# Patient Record
Sex: Male | Born: 1950
Health system: Southern US, Community
[De-identification: ages and names within clinical notes are randomized; demographics above are authoritative.]

## PROBLEM LIST (undated history)

## (undated) ENCOUNTER — Ambulatory Visit: Admission: EM | Payer: Medicare (Managed Care) | Source: Home / Self Care

## (undated) DIAGNOSIS — R52 Pain, unspecified: Secondary | ICD-10-CM

## (undated) DIAGNOSIS — T8859XA Other complications of anesthesia, initial encounter: Secondary | ICD-10-CM

## (undated) DIAGNOSIS — T4145XA Adverse effect of unspecified anesthetic, initial encounter: Secondary | ICD-10-CM

## (undated) DIAGNOSIS — D649 Anemia, unspecified: Secondary | ICD-10-CM

## (undated) DIAGNOSIS — K219 Gastro-esophageal reflux disease without esophagitis: Secondary | ICD-10-CM

## (undated) DIAGNOSIS — E785 Hyperlipidemia, unspecified: Secondary | ICD-10-CM

## (undated) DIAGNOSIS — I10 Essential (primary) hypertension: Secondary | ICD-10-CM

## (undated) DIAGNOSIS — G43909 Migraine, unspecified, not intractable, without status migrainosus: Secondary | ICD-10-CM

## (undated) HISTORY — PX: CHOLECYSTECTOMY: SHX55

## (undated) HISTORY — DX: Migraine, unspecified, not intractable, without status migrainosus: G43.909

## (undated) HISTORY — PX: TONSILLECTOMY: SUR1361

## (undated) HISTORY — PX: OTHER SURGICAL HISTORY: SHX169

---

## 2004-07-16 ENCOUNTER — Encounter: Admission: RE | Admit: 2004-07-16 | Discharge: 2004-07-16 | Payer: Self-pay | Admitting: Orthopaedic Surgery

## 2004-08-17 HISTORY — PX: BACK SURGERY: SHX140

## 2005-01-01 ENCOUNTER — Inpatient Hospital Stay (HOSPITAL_COMMUNITY): Admission: RE | Admit: 2005-01-01 | Discharge: 2005-01-05 | Payer: Self-pay | Admitting: Orthopaedic Surgery

## 2005-04-22 ENCOUNTER — Emergency Department (HOSPITAL_COMMUNITY): Admission: EM | Admit: 2005-04-22 | Discharge: 2005-04-22 | Payer: Self-pay | Admitting: Emergency Medicine

## 2006-04-28 ENCOUNTER — Ambulatory Visit (HOSPITAL_BASED_OUTPATIENT_CLINIC_OR_DEPARTMENT_OTHER): Admission: RE | Admit: 2006-04-28 | Discharge: 2006-04-28 | Payer: Self-pay | Admitting: Orthopedic Surgery

## 2009-05-21 ENCOUNTER — Emergency Department (HOSPITAL_COMMUNITY): Admission: EM | Admit: 2009-05-21 | Discharge: 2009-05-21 | Payer: Self-pay | Admitting: Emergency Medicine

## 2009-06-03 ENCOUNTER — Encounter: Admission: RE | Admit: 2009-06-03 | Discharge: 2009-06-03 | Payer: Self-pay | Admitting: Family Medicine

## 2010-05-13 ENCOUNTER — Telehealth: Payer: Self-pay | Admitting: Internal Medicine

## 2010-11-20 LAB — URINALYSIS, ROUTINE W REFLEX MICROSCOPIC
Bilirubin Urine: NEGATIVE
Glucose, UA: NEGATIVE mg/dL
Hgb urine dipstick: NEGATIVE
Ketones, ur: NEGATIVE mg/dL
Nitrite: NEGATIVE
Protein, ur: NEGATIVE mg/dL
Specific Gravity, Urine: 1.029 (ref 1.005–1.030)
Urobilinogen, UA: 1 mg/dL (ref 0.0–1.0)
pH: 6 (ref 5.0–8.0)

## 2011-01-02 NOTE — Op Note (Signed)
NAMESAYEED, WEATHERALL             ACCOUNT NO.:  [id number]   MEDICAL RECORD NO.:  [id number]          PATIENT TYPE:  AMB   LOCATION:  DSC                          FACILITY:  [facility]   PHYSICIAN:  Leonides Grills, M.D.     DATE OF BIRTH:  May 17, 1951   DATE OF PROCEDURE:  04/28/2006  DATE OF DISCHARGE:                                 OPERATIVE REPORT   PREOPERATIVE DIAGNOSIS:  Complication of hardware, left foot.   POSTOPERATIVE DIAGNOSIS:  Complication of hardware, left foot.   OPERATION:  1. Hardware removal, deep left foot.  2. Stress x-rays, left foot.   SURGEON:  Leonides Grills, M.D.   ASSISTANT:  Evlyn Kanner, P.A.   ANESTHESIA:  General.   ESTIMATED BLOOD LOSS:  Minimal.   TOURNIQUET TIME:  Approximately 15 minutes.   COMPLICATIONS:  None.   DISPOSITION:  Stable to the PAR.   INDICATIONS:  This is a 60 year old male, who has had persistent plantar  foot pain after a Chevron osteotomy.  He is 1 year postop.  He has consented  to the above procedure.  All risks, which include infection, nerve or vessel  injury, persistent pain, worsening pain, stiffness and arthritis, were all  explained.  Questions were encouraged and answered.   DESCRIPTION OF OPERATION:  The patient was brought to the operating room and  placed in supine position.  After adequate general endotracheal tube  anesthesia was administered, as well as Ancef 1 g IV piggyback, the left  lower extremity was then prepped and draped in the usual sterile manner over  a proximally placed thigh cavity.  The limb was gravity exsanguinated and  the tourniquet was elevated to 290 mmHg.  Under C-arm guidance, the head of  the screw was then identified on the dorsomedial aspect of his left great  toe.  This was then marked, and a nick-and-spread technique was utilized to  go down to bone.  Then, with 2 Ragnell retractors, the head was then  identified and the screw was removed with a minifragment screwdriver.  This  was intact with no evidence of hardware failure.  Stress x-rays were obtained and showed no gross motion and healing of the  osteotomy.  The tourniquet was deflated.  Hemostasis was obtained.  The  wound was copiously irrigated with normal saline.  The skin was closed with  4-0 nylon suture.  A sterile dressing was applied.  A hard-sole shoe was  applied.  The patient was stable to the PAR.      Leonides Grills, M.D.  Electronically Signed     PB/MEDQ  D:  04/28/2006  T:  04/28/2006  Job:  [id number]

## 2011-01-02 NOTE — Op Note (Signed)
NAMESMITTY, ACKERLEY             ACCOUNT NO.:  [id number]   MEDICAL RECORD NO.:  [id number]          PATIENT TYPE:  INP   LOCATION:  2550                         FACILITY:  [facility]   PHYSICIAN:  Sharolyn Douglas, M.D.        DATE OF BIRTH:  1951/06/07   DATE OF PROCEDURE:  01/01/2005  DATE OF DISCHARGE:                                 OPERATIVE REPORT   DIAGNOSES:  1.  S1-S2 isthmic spondylolisthesis.  2.  L4-L5 degenerative disk disease and spinal stenosis.  3.  Chronic back and left leg pain.   PROCEDURE:  1.  L4-L5 and L5-S1 laminectomy with decompression of the thecal sac and      nerve roots bilaterally.  2.  Transforaminal lumbar interbody fusion L4-L5 and L5-S1 with placement of      two peak cages.  3.  Posterior spinal arthrodesis, L4-S1  4.  Segmental pedicle screw instrumentation L4-S1 using spine system.  5.  Local autogenous bone graft supplemented with bone morphogenic protein.   SURGEON:  Sharolyn Douglas, M.D.   ASSISTANT:  Verlin Fester, P.A.   ANESTHESIA:  General endotracheal.   COMPLICATIONS:  None.   ESTIMATED BLOOD LOSS:  300 cc.   INDICATIONS:  The patient is a pleasant 60 year old male with chronic,  persistent back and left leg pain.  He has been refractory to all  conservative treatment modalities.  He has had a second opinion.  He has  elected to undergo L4-S1 decompression and fusion for treatment of isthmic  spondylolisthesis at L5-S1 and concordant pain on diskography at L4-L5.  For  numbering purposes, we will consider the last two motion segments as L5-S1  and S1-S2 which were the levels operated on today.   DESCRIPTION OF PROCEDURE:  The patient was identified in the holding area,  taken to the operating room, underwent general endotracheal anesthesia  without difficulty, and given prophylactic IV antibiotics.  Carefully turned  prone onto the Wilson frame with all bony prominences padded and the back  prepped and draped in the usual sterile fashion.   Neural monitoring was  established in the form of SSEP in the upper extremities and lower extremity  EMGs.  Midline incision was utilized.  Dissection was carried sharply  through the deep fascia.  The transverse processes of L5, S1 and the sacral  ala of S2 identified bilaterally.  Deep retractors were placed.  Intraoperative x-ray confirmed appropriate levels.  The S1 spinous process  and lamina was loose consistent with the patient's diagnosis of isthmic  spondylolisthesis.  The patient was hyperlordotic and it appeared that the  L5-S1 joints were impinging upon the S1-S2 joints.  A wide laminectomy was  completed by removing the entire spinous process and lamina of S1.  Half of  the L5 spinous process was removed, preserving the interspinous ligament  between L4 and L5.  The thecal sac was decompressed.  The nerve roots were  identified bilaterally and the lateral recess was undercut.  We then  completed foraminotomies, decompressing the L5 nerve roots out their  foramen.   We then turned our attention to  placing pedicle screws at L5, S1 and S2  bilaterally.  We utilized 6.5 x 15 mm screws at L5, 6.5 x 35 mm screws in  S1, and 7.5 x 30 mm screws in S2.  We had good screw purchase.  Each pedicle  hole was palpated and there were no breaches.  We could also palpate the  pedicles from within the spinal canal.  Each screw was stimulated using  trigger EMGs and there were no  deleterious changes.   We then turned our attention to completing transforaminal lumbar interbody  fusions on the left side at L5-S1 and S1-S2.  The disk spaces were entered,  the exiting and traversing nerve roots were identified and protected at all  times.  Free running EMGs were monitored. There were no deleterious changes.  Radical diskectomy completed with curved TLIF instruments.  The  cartilaginous endplates were scraped.  The disk spaces were packed with BMP  sponges as well as local bone graft obtained from  the laminectomy.  We then  inserted a 9-mm peak cage packed with a BMP sponge into the S1-S2 interspace  and carefully tamped it across the midline and anteriorly.  A similar  procedure was carried out at L5-S1, this time utilizing an 11-mm peak cage.  We completed the posterior spinal arthrodesis by decorticating the  transverse processes of L5, S1 and the S2 ala.  The remaining local bone  graft was packed into the lateral gutters.  60-mm titanium rods were bent  into lordosis and placed into the polyaxial screw heads.  Gentle compression  was applied across the fused segments before shearing off the locking caps.  A transverse connector was placed.  Deep Hemovac drain left, deep fascia  closed with a running #1 Vicryl, subcutaneous layer closed with interrupted  2-0 Vicryl, followed by a running 3-0 subcuticular Vicryl suture on the skin  edges.  Benzoin and Steri-Strips placed.  Sterile dressing applied. The  patient was turned supine, extubated without difficulty and transferred to  recovery.  It should be noted that intraoperative x-rays showed appropriate  positioning of the instrumentation, L5-S2.  The patient was evaluated in the  holding area and he was moving his upper and lower extremities.      MC/MEDQ  D:  01/01/2005  T:  01/01/2005  Job:  [id number]

## 2011-01-02 NOTE — H&P (Signed)
NAME:  [name], [name]             ACCOUNT NO.:  [id number]   MEDICAL RECORD NO.:  [id number]          PATIENT TYPE:  INP   LOCATION:  NA                           FACILITY:  [facility]   PHYSICIAN:  Sharolyn Douglas, M.D.        DATE OF BIRTH:  1951-05-03   DATE OF ADMISSION:  01/01/2005  DATE OF DISCHARGE:                                HISTORY & PHYSICAL   CHIEF COMPLAINT:  Pain in my low back.   HISTORY OF PRESENT ILLNESS:  A 60 year old white male who has been seen by  [place] with progressive problems concerning persistent low back pain with  radiation into the lower extremity. The pain has been worsening over the  last several months. Discogram performed showed painful disc at L5-S1 and S1-  S2. He has had epidural steroid injections in these areas at the end of last  but unfortunately has not ameliorating his overall discomfort. He continues  to state his pain as 9/10 on analog scale and is quite miserable with his  discomfort. He has explored and studied the surgical procedures, had a  second opinion by Dr. Kathaleen Maser. Pool. Due to the chronicity of his discomfort  and the fact that conservative care has not helped him, he has decided to go  ahead with surgical intervention at this time and is being admitted for a L4  to S1 posterior spinal fusion.   The patient has been cleared preoperatively by Dr. Tally Joe of [facility]. Today, the patient is fitted with a Centric lumbar  orthosis which will be used postoperatively for his care.   PAST MEDICAL HISTORY:  This gentleman has been in relative good health  throughout his life time.   ALLERGIES:  No medical allergies.   MEDICATIONS:  Diclofenac 50 mg b.i.d. which he plans to stop prior to  surgery.   PAST SURGICAL HISTORY:  His only surgery has been to his left foot in the  past.   FAMILY HISTORY:  Positive for diabetes in his mother.   SOCIAL HISTORY:  The patient is married. He works for [employer]. He has  no  intake of tobacco or alcohol products. He has three children. Wife and  daughter will be major care givers after surgery. He lives in a two-story  home with 12 stairs.   REVIEW OF SYMPTOMS:  CNS:  No seizure disorder, paralysis, double vision.  The patient does have radicular pain from the lumbar spine into the lower  extremities. CARDIOVASCULAR:  No chest pain, no angina, and no orthopnea.  RESPIRATORY:  No productive cough, no hemoptysis, or shortness of breath.  GASTROINTESTINAL:  No nausea, vomiting, melena, or bloody stools.  GENITOURINARY:  No discharge, dysuria, or hematuria. MUSCULOSKELETAL:  Primarily in present illness.   PHYSICAL EXAMINATION:  GENERAL:  Alert, cooperative, thin, 60 year old white  male.  VITAL SIGNS:  Blood pressure 140/78, pulse 68, respirations 12.  HEENT:  Normocephalic. PERRLA. EOMI. Oropharynx clear.  CHEST:  Clear to auscultation. No rhonchi, no rales, no wheezes.  HEART:  Regular rate and rhythm. No murmurs are heard.  ABDOMEN:  Soft and nontender. Liver and spleen not felt.  GENITALIA:  Not done, not pertinent to present illness.  RECTAL:  Not done, not pertinent to present illness.  EXTREMITIES:  Straight leg raise is negative bilaterally in the lower  extremities.  NEUROLOGICAL:  Grossly intact and nonfocal. Flexion and extension in the  lumbar spine does increase his pain and discomfort into the buttocks area.   ADMISSION DIAGNOSES:  Lumbar verterbrae-5/sacral vertebrae-1 degenerative  disc disease with pars fracture and stenosis at the same levels.   PLAN:  The patient will undergo L4/S1 posterior spinal fusion and lumbar  laminectomy with pedicle screws. He will use his Centric lumbar corset  postoperatively for assist in the healing of the fusion.      DLU/MEDQ  D:  12/27/2004  T:  12/27/2004  Job:  [id number]   cc:   Tally Joe, M.D.  [address]  [place], [place] [address]  Fax: [phone]

## 2011-01-02 NOTE — Discharge Summary (Signed)
[name], [name]             ACCOUNT NO.:  [id number]   MEDICAL RECORD NO.:  [id number]          PATIENT TYPE:  INP   LOCATION:  5012                         FACILITY:  [facility]   PHYSICIAN:  Sharolyn Douglas, M.D.        DATE OF BIRTH:  1950/09/24   DATE OF ADMISSION:  01/01/2005  DATE OF DISCHARGE:  01/05/2005                                 DISCHARGE SUMMARY   ADMISSION DIAGNOSIS:  Degenerative disk disease and spondylolisthesis of the  last two mobile segments in his spine.   DISCHARGE DIAGNOSES:  1.  Status post posterior spinal fusion of last two mobile segments.  2.  Mild blood loss anemia.  3.  Hyperglycemia postoperatively.   PROCEDURE:  On Jan 01, 2005, the patient was taken to the operating room for  a L5-S2 or the last two mobile segments posterior spinal fusion with pedicle  screws and T-LIFs as well as BMP.  This was done by Sharolyn Douglas, M.D.,  assistant [name], P.A.-C.  Anesthesia was general.   CONSULTATIONS:  None.   LABORATORY DATA:  Preoperatively CBC with differential within normal limits.  Postoperatively, hemoglobin and hematocrit monitored x3 days, did reach a  low on Jan 03, 2005, of 11.5 and 33.8, respectively.  He did not require  treatment of this.  PT and INR and PTT preoperatively were normal.  Comprehensive metabolic panel preoperatively was normal except for an AST of  39, AST of 50.  Postoperatively basic metabolic panel was monitored and was  within normal limits with the exception of glucose of 120 on Jan 02, 2005,  119 on Jan 03, 2005.  UA was negative preoperatively.  His blood type  preoperatively was type O, Rh type negative, antibody screen negative.  EKG  from Dec 30, 2004, showed sinus bradycardia, no previous tracing, tracing  read by [name], M.D.  X-rays were used intraoperative to confirm screw  placement.   BRIEF HISTORY:  The patient is a 60 year old male who has had a long history  of problems related to his back.  He has  tried numerous types of  conservative treatment an in an attempt to improve his pain.  Unfortunately,  he has not seen any improvement that lasted for any period of time.  He has  had a discogram that showed patent disk at L5-S1 and S1-2, which are his  last two mobile segments. Because of increasing pain and failure to improve  with conservative treatment, it was felt that his best course of management  would be posterior spinal fusion of his last two mobile segments.  Dr. Noel Cantu  discussed the risks and benefits of this procedure with him.  He indicated  understanding and opted to proceed.   HOSPITAL COURSE:  On Jan 01, 2005, the patient was taken to the operating  room for the above-listed procedure.  He tolerated the procedure well  without any intraoperative complications.  He was transferred to the  recovery room in stable condition.  There was approximately 300 mL of blood  loss intraoperatively.  There was one Hemovac drain placed intraoperatively.  Postoperatively routine orthopedic spine protocol was followed.  He  progressed along very well.  He was seen by physical therapy on a daily  basis, made excellent progress with them.  His diet was advanced after  flatus, and he did not have any difficulty.   By Jan 05, 2005, the patient met all orthopedic goals.  He was ambulating  safely.  He was comfortable in his brace.  He easily understood his back  precautions.  He was independent with all of these and, as well, he was  medically stable and ready for discharge as well.   DISCHARGE PLAN:   FINAL DIAGNOSIS:  The patient is a 60 year old male status post L5 to S1  posterior spinal fusion, doing very well.   ACTIVITY:  Daily ambulation, brace on when he is up, dressing changes daily.  May shower when there is no drainage from his incision.  Back precautions.  No lifting heavier than five pounds.   FOLLOW UP:  Two weeks postoperatively with Dr. Noel Cantu, call for an   appointment.   DIET:  Resume his regular home diet.   MEDICATIONS:  Vicodin for pain, Robaxin for muscle spasm, multivitamin  daily, calcium daily, Colace twice daily, laxative as needed.  Resume home  medications.  Avoid anti-inflammatories x3 months.   CONDITION ON DISCHARGE:  Stable, improved.   DISPOSITION:  Patient being discharged to his home with his family's  assistance as well as home health physical therapy.       CM/MEDQ  D:  03/18/2005  T:  03/18/2005  Job:  [id number]

## 2011-02-09 NOTE — Telephone Encounter (Signed)
Summary: Schedule Colonoscopy  Phone Note Outgoing Call Call back at Home Phone [phone]   Call placed by: Harlow Mares CMA Duncan Dull),  May 13, 2010 8:42 AM Call placed to: Patient Summary of Call: Left a message on patients machine to call back. patient due for a colonoscopy Initial call taken by: Harlow Mares CMA Duncan Dull),  May 13, 2010 8:43 AM  Follow-up for Phone Call        Left a message on the patient machine to call back and schedule a previsit and procedure with our office. A letter will be mailed to the patient.   Follow-up by: Harlow Mares CMA Duncan Dull),  May 13, 2010 8:43 AM

## 2011-06-03 ENCOUNTER — Inpatient Hospital Stay (INDEPENDENT_AMBULATORY_CARE_PROVIDER_SITE_OTHER)
Admission: RE | Admit: 2011-06-03 | Discharge: 2011-06-03 | Disposition: A | Discharge status: Home / Self Care | Payer: 59 | Source: Ambulatory Visit | Attending: Family Medicine | Admitting: Family Medicine

## 2011-06-03 DIAGNOSIS — S058X9A Other injuries of unspecified eye and orbit, initial encounter: Secondary | ICD-10-CM

## 2013-04-13 ENCOUNTER — Emergency Department (HOSPITAL_COMMUNITY)
Admission: EM | Admit: 2013-04-13 | Discharge: 2013-04-13 | Disposition: A | Discharge status: Home / Self Care | Payer: 59 | Attending: Emergency Medicine | Admitting: Emergency Medicine

## 2013-04-13 ENCOUNTER — Encounter (HOSPITAL_COMMUNITY): Payer: Self-pay | Admitting: Adult Health

## 2013-04-13 DIAGNOSIS — Y9289 Other specified places as the place of occurrence of the external cause: Secondary | ICD-10-CM | POA: Insufficient documentation

## 2013-04-13 DIAGNOSIS — Z79899 Other long term (current) drug therapy: Secondary | ICD-10-CM | POA: Insufficient documentation

## 2013-04-13 DIAGNOSIS — T1510XA Foreign body in conjunctival sac, unspecified eye, initial encounter: Secondary | ICD-10-CM | POA: Insufficient documentation

## 2013-04-13 DIAGNOSIS — Y9389 Activity, other specified: Secondary | ICD-10-CM | POA: Insufficient documentation

## 2013-04-13 DIAGNOSIS — T1590XA Foreign body on external eye, part unspecified, unspecified eye, initial encounter: Secondary | ICD-10-CM | POA: Insufficient documentation

## 2013-04-13 DIAGNOSIS — Z7982 Long term (current) use of aspirin: Secondary | ICD-10-CM | POA: Insufficient documentation

## 2013-04-13 DIAGNOSIS — S00251A Superficial foreign body of right eyelid and periocular area, initial encounter: Secondary | ICD-10-CM

## 2013-04-13 MED ORDER — TETRACAINE HCL 0.5 % OP SOLN
2.0000 [drp] | Freq: Once | OPHTHALMIC | Status: AC
  Administered 2013-04-13: 2 [drp] via OPHTHALMIC
  Filled 2013-04-13: qty 4

## 2013-04-13 MED ORDER — FLUORESCEIN SODIUM 1 MG OP STRP
1.0000 | ORAL_STRIP | Freq: Once | OPHTHALMIC | Status: AC
  Administered 2013-04-13: 1 via OPHTHALMIC
  Filled 2013-04-13: qty 1

## 2013-04-13 MED ORDER — TOBRAMYCIN 0.3 % OP SOLN: 1.0000 [drp] | OPHTHALMIC | Status: DC

## 2013-04-13 NOTE — ED Provider Notes (Signed)
Medical screening examination/treatment/procedure(s) were performed by non-physician practitioner and as supervising physician I was immediately available for consultation/collaboration.  Palestine Mosco, MD 04/13/13 0425 

## 2013-04-13 NOTE — ED Provider Notes (Signed)
CSN: [id number]     Arrival date & time 04/13/13  0041 History   First MD Initiated Contact with Patient 04/13/13 [phone]     Chief Complaint  Patient presents with  . Eye Pain   HPI  History provided by the patient. Patient is a 62 year old male with no significant PMH who presents with complaints of foreign body in his right eye. Patient states that he was doing yard work yesterday and when going inside felt like something may have gotten into his eye. He did try to rinse his eye and use eye drops to clear it but he continues to feel like something may be in the eye especially when he blinks and closes the eye. Pain is mild. He denies any redness to the eye. There has not been significant tearing or drainage. Denies any vision change. No other aggravating or alleviating factors. No other associated symptoms.    History reviewed. No pertinent past medical history. History reviewed. No pertinent past surgical history. History reviewed. No pertinent family history. History  Substance Use Topics  . Smoking status: Never Smoker   . Smokeless tobacco: Not on file  . Alcohol Use: No    Review of Systems  Eyes: Positive for pain. Negative for redness and visual disturbance.  All other systems reviewed and are negative.    Allergies  Codeine  Home Medications   Current Outpatient Rx  Name  Route  Sig  Dispense  Refill  . aspirin EC 81 MG tablet   Oral   Take 81 mg by mouth daily.         Marland Kitchen atorvastatin (LIPITOR) 20 MG tablet   Oral   Take 20 mg by mouth daily.         Marland Kitchen losartan-hydrochlorothiazide (HYZAAR) 100-12.5 MG per tablet   Oral   Take 1 tablet by mouth daily.         . Misc Natural Products (OSTEO BI-FLEX JOINT SHIELD PO)   Oral   Take 1 tablet by mouth daily.         . Multiple Vitamin (MULTIVITAMIN WITH MINERALS) TABS tablet   Oral   Take 1 tablet by mouth daily.         . Multiple Vitamins-Iron (MULTIVITAMIN/IRON PO)   Oral   Take 1 tablet by mouth  daily.         . Omega-3 Fatty Acids (FISH OIL PO)   Oral   Take 1 capsule by mouth daily.         Marland Kitchen topiramate (TOPAMAX) 50 MG tablet   Oral   Take 50 mg by mouth at bedtime.          BP 114/74  Pulse 64  Temp(Src) 97.5 F (36.4 C) (Oral)  Resp 16  Ht 5\' 9"  (1.753 m)  Wt 205 lb (92.987 kg)  BMI 30.26 kg/m2  SpO2 99% Physical Exam  Nursing note and vitals reviewed. Constitutional: He is oriented to person, place, and time. He appears well-developed and well-nourished. No distress.  HENT:  Head: Normocephalic.  Eyes: Conjunctivae and EOM are normal. Pupils are equal, round, and reactive to light.  Slit lamp exam:      The right eye shows no corneal abrasion and no fluorescein uptake.  Small whitish foreign body under the right upper eyelid. Removed easily. Slight irritation to the tissue of the eyelid.  Cardiovascular: Normal rate and regular rhythm.   Pulmonary/Chest: Effort normal and breath sounds normal.  Neurological: He is alert  and oriented to person, place, and time.  Skin: Skin is warm.  Psychiatric: His behavior is normal.    ED Course  Procedures  Labs Review Labs Reviewed - No data to display Imaging Review No results found.  MDM   1. Foreign body of eyelid, right      2:20AM patient seen and evaluated. He appears well.     Angus Seller, PA-C 04/13/13 [phone]

## 2013-04-13 NOTE — ED Notes (Signed)
Presents with right eye pain worse when eye is closed, described as "someting is in it and scratchy feeling" began after cutting this afternoon. No FB visualized. Denies Loss of vision.

## 2013-08-08 ENCOUNTER — Other Ambulatory Visit: Payer: Self-pay | Admitting: Orthopedic Surgery

## 2013-08-15 ENCOUNTER — Other Ambulatory Visit: Payer: Self-pay | Admitting: Orthopedic Surgery

## 2013-08-15 NOTE — H&P (Signed)
[name] is an 62 y.o. male.   Chief Complaint: right shoulder pain HPI: The patient is a 62 year old male being followed for their right shoulder pain. They are 11 week(s) out from when symptoms began. The patient reports that the shoulder symptoms began in association with a new activity. The activity involved scuba diving, weight from the tank and the vest.  Symptoms reported today include: pain. and report their pain level to be severe. Current treatment includes: NSAIDs (Ibuprofen prn). The following medication has been used for pain control: Ultram. The patient presents today following MRI. The patient has reported improvement of their symptoms with: Cortisone injections (helped for 2 weeks).  No past medical history on file.  No past surgical history on file.  No family history on file. Social History:  reports that he has never smoked. He does not have any smokeless tobacco history on file. He reports that he does not drink alcohol or use illicit drugs.  Allergies:  Allergies  Allergen Reactions  . Codeine Itching     (Not in a hospital admission)  No results found for this or any previous visit (from the past 48 hour(s)). No results found.  Review of Systems  Constitutional: Negative.   HENT: Negative.   Eyes: Negative.   Respiratory: Negative.   Cardiovascular: Negative.   Gastrointestinal: Negative.   Genitourinary: Negative.   Musculoskeletal: Positive for joint pain.  Skin: Negative.   Neurological: Negative.   Psychiatric/Behavioral: Negative.     There were no vitals taken for this visit. Physical Exam  Constitutional: He is oriented to person, place, and time. He appears well-developed and well-nourished.  HENT:  Head: Normocephalic and atraumatic.  Eyes: Conjunctivae and EOM are normal. Pupils are equal, round, and reactive to light.  Neck: Normal range of motion. Neck supple.  Cardiovascular: Normal rate and regular rhythm.   Respiratory: Effort  normal and breath sounds normal.  GI: Soft. Bowel sounds are normal.  Musculoskeletal:  On exam he actually has good forward flexion, though he has pain with abduction, internal rotation. He has some weakness in external rotation.  Inspection of the cervical spine reveals a normal lordosis without evidence of paraspinous spasms or soft tissue swelling. Nontender to palpation. Extension combined with lateral flexion does not reproduce pain. Negative impingement sign, negative secondary impingement sign of the shoulders. Negative Tinel's at median and ulnar nerves at the elbow. Negative carpal compression test at the wrists. Motor of the upper extremities is 5/5 including biceps, triceps, brachioradialis,wrist flexion, wrist extension, finger flexion, finger extension. Reflexes are normoreflexic. Sensory exam is intact to light touch. There is no Hoffmann sign. Nontender over the thoracic spine.  The contralateral shoulder minimal impingement is noted. Re-examining him, he has no symptoms referable to his AC joint.  Neurological: He is alert and oriented to person, place, and time. He has normal reflexes.  Skin: Skin is warm and dry.  Psychiatric: He has a normal mood and affect.    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  Assessment/Plan Right shoulder Full thickness retracted tear of the rotator cuff, mild atrophy.  Discussed options to proceed with a repair. We discussed the etiology of this again. He was just scuba diving, he had no traumatic event. He felt no symptoms at the time. He had pain that night. He came in three weeks after that for pain. We gave him an injection and instructed him to call back if   it was not efficacious for an MRI. It was about four weeks until he called back for his MRI and the MRI has shown that he actually has a tear. He might have had significant partial tear that completed without significant trauma. In  any event he will have to proceed accordingly. He would like to do this as an outpatient and to go home the same day, so we can use a block. No history of MRSA.  Cantu, Darrell M. for Dr. Beane 08/15/2013, 5:02 PM    

## 2013-08-23 ENCOUNTER — Encounter (HOSPITAL_COMMUNITY): Payer: Self-pay | Admitting: Pharmacy Technician

## 2013-08-29 ENCOUNTER — Encounter (INDEPENDENT_AMBULATORY_CARE_PROVIDER_SITE_OTHER): Payer: Self-pay

## 2013-08-29 ENCOUNTER — Encounter (HOSPITAL_COMMUNITY): Payer: Self-pay

## 2013-08-29 ENCOUNTER — Encounter (HOSPITAL_COMMUNITY)
Admission: RE | Admit: 2013-08-29 | Discharge: 2013-08-29 | Disposition: A | Discharge status: Home / Self Care | Payer: 59 | Source: Ambulatory Visit | Attending: Specialist | Admitting: Specialist

## 2013-08-29 ENCOUNTER — Ambulatory Visit (HOSPITAL_COMMUNITY)
Admission: RE | Admit: 2013-08-29 | Discharge: 2013-08-29 | Disposition: A | Discharge status: Home / Self Care | Payer: 59 | Source: Ambulatory Visit | Attending: Specialist | Admitting: Specialist

## 2013-08-29 DIAGNOSIS — Z01818 Encounter for other preprocedural examination: Secondary | ICD-10-CM | POA: Insufficient documentation

## 2013-08-29 DIAGNOSIS — Z01810 Encounter for preprocedural cardiovascular examination: Secondary | ICD-10-CM | POA: Insufficient documentation

## 2013-08-29 DIAGNOSIS — Z01812 Encounter for preprocedural laboratory examination: Secondary | ICD-10-CM | POA: Insufficient documentation

## 2013-08-29 DIAGNOSIS — I10 Essential (primary) hypertension: Secondary | ICD-10-CM | POA: Insufficient documentation

## 2013-08-29 DIAGNOSIS — R911 Solitary pulmonary nodule: Secondary | ICD-10-CM | POA: Insufficient documentation

## 2013-08-29 HISTORY — DX: Anemia, unspecified: D64.9

## 2013-08-29 HISTORY — DX: Gastro-esophageal reflux disease without esophagitis: K21.9

## 2013-08-29 HISTORY — DX: Adverse effect of unspecified anesthetic, initial encounter: T41.45XA

## 2013-08-29 HISTORY — DX: Essential (primary) hypertension: I10

## 2013-08-29 HISTORY — DX: Other complications of anesthesia, initial encounter: T88.59XA

## 2013-08-29 HISTORY — DX: Pain, unspecified: R52

## 2013-08-29 LAB — CBC
HCT: 43.2 % (ref 39.0–52.0)
Hemoglobin: 15 g/dL (ref 13.0–17.0)
MCH: 31.1 pg (ref 26.0–34.0)
MCHC: 34.7 g/dL (ref 30.0–36.0)
MCV: 89.6 fL (ref 78.0–100.0)
Platelets: 271 10*3/uL (ref 150–400)
RBC: 4.82 MIL/uL (ref 4.22–5.81)
RDW: 13.3 % (ref 11.5–15.5)
WBC: 6 10*3/uL (ref 4.0–10.5)

## 2013-08-29 LAB — BASIC METABOLIC PANEL
BUN: 20 mg/dL (ref 6–23)
CO2: 28 mEq/L (ref 19–32)
Calcium: 9.3 mg/dL (ref 8.4–10.5)
Chloride: 107 mEq/L (ref 96–112)
Creatinine, Ser: 1.14 mg/dL (ref 0.50–1.35)
GFR calc Af Amer: 78 mL/min — ABNORMAL LOW (ref >90)
GFR calc non Af Amer: 67 mL/min — ABNORMAL LOW (ref >90)
Glucose, Bld: 96 mg/dL (ref 70–99)
Potassium: 4.7 mEq/L (ref 3.7–5.3)
Sodium: 146 mEq/L (ref 137–147)

## 2013-08-29 NOTE — Patient Instructions (Addendum)
YOUR SURGERY IS SCHEDULED AT [facility]  ON:  Friday  1/16  REPORT TO  SHORT STAY CENTER AT:  11:00 AM      PHONE # FOR SHORT STAY IS [phone]  DO NOT EAT ANYTHING AFTER MIDNIGHT THE NIGHT BEFORE YOUR SURGERY.  YOU MAY BRUSH YOUR TEETH, RINSE OUT YOUR MOUTH-- NO FOOD, NO CHEWING GUM, NO MINTS, NO CANDIES, NO CHEWING TOBACCO. YOU MAY HAVE CLEAR LIQUIDS TO DRINK FROM MIDNIGHT UNTIL 7:00 AM THE MORNING OF SURGERY - SUCH AS WATER, COFFEE - NO MILK OR MILK PRODUCTS,  SODA.    NOTHING TO DRINK AFTER 7:00 AM THE DAY OF YOUR SURGERY.   PLEASE TAKE THE FOLLOWING MEDICATIONS THE AM OF YOUR SURGERY WITH A FEW SIPS OF WATER:     LIPITOR AND NEXIUM   DO NOT BRING VALUABLES, MONEY, CREDIT CARDS.  DO NOT WEAR JEWELRY, MAKE-UP, NAIL POLISH AND NO METAL PINS OR CLIPS IN YOUR HAIR. CONTACT LENS, DENTURES / PARTIALS, GLASSES SHOULD NOT BE WORN TO SURGERY AND IN MOST CASES-HEARING AIDS WILL NEED TO BE REMOVED.  BRING YOUR GLASSES CASE, ANY EQUIPMENT NEEDED FOR YOUR CONTACT LENS. FOR PATIENTS ADMITTED TO THE HOSPITAL--CHECK OUT TIME THE DAY OF DISCHARGE IS 11:00 AM.  ALL INPATIENT ROOMS ARE PRIVATE - WITH BATHROOM, TELEPHONE, TELEVISION AND WIFI INTERNET.  IF YOU ARE BEING DISCHARGED THE SAME DAY OF YOUR SURGERY--YOU CAN NOT DRIVE YOURSELF HOME--AND SHOULD NOT GO HOME ALONE BY TAXI OR BUS.  NO DRIVING OR OPERATING MACHINERY, DO NOT MAKE LEGAL DECISIONS FOR 24 HOURS FOLLOWING ANESTHESIA / PAIN MEDICATIONS.  PLEASE MAKE ARRANGEMENTS FOR SOMEONE TO BE WITH YOU AT HOME THE FIRST 24 HOURS AFTER SURGERY. RESPONSIBLE DRIVER'S NAME / PHONE                                                   PLEASE READ OVER ANY  FACT SHEETS THAT YOU WERE GIVEN:  INCENTIVE SPIROMETER INFORMATION.  FAILURE TO FOLLOW THESE INSTRUCTIONS MAY RESULT IN THE CANCELLATION OF YOUR SURGERY. PLEASE BE AWARE THAT YOU MAY NEED ADDITIONAL BLOOD DRAWN DAY OF YOUR SURGERY  PATIENT SIGNATURE_________________________________

## 2013-08-29 NOTE — Pre-Procedure Instructions (Signed)
EKG AND CXR WERE DONE TODAY PREOP AT [facility] AS PER ANESTHESIOLOGIST'S GUIDELINES. 

## 2013-09-01 ENCOUNTER — Ambulatory Visit (HOSPITAL_COMMUNITY)
Admission: RE | Admit: 2013-09-01 | Discharge: 2013-09-02 | Disposition: A | Discharge status: Home / Self Care | Payer: 59 | Source: Ambulatory Visit | Attending: Specialist | Admitting: Specialist

## 2013-09-01 ENCOUNTER — Ambulatory Visit (HOSPITAL_COMMUNITY): Payer: 59 | Admitting: Anesthesiology

## 2013-09-01 ENCOUNTER — Encounter (HOSPITAL_COMMUNITY)
Admission: RE | Disposition: A | Discharge status: Home / Self Care | Payer: Self-pay | Source: Ambulatory Visit | Attending: Specialist

## 2013-09-01 ENCOUNTER — Encounter (HOSPITAL_COMMUNITY): Payer: Self-pay | Admitting: *Deleted

## 2013-09-01 ENCOUNTER — Encounter (HOSPITAL_COMMUNITY): Payer: 59 | Admitting: Anesthesiology

## 2013-09-01 DIAGNOSIS — M719 Bursopathy, unspecified: Principal | ICD-10-CM | POA: Insufficient documentation

## 2013-09-01 DIAGNOSIS — M67919 Unspecified disorder of synovium and tendon, unspecified shoulder: Secondary | ICD-10-CM | POA: Insufficient documentation

## 2013-09-01 DIAGNOSIS — M19019 Primary osteoarthritis, unspecified shoulder: Secondary | ICD-10-CM | POA: Insufficient documentation

## 2013-09-01 DIAGNOSIS — I10 Essential (primary) hypertension: Secondary | ICD-10-CM | POA: Insufficient documentation

## 2013-09-01 DIAGNOSIS — K219 Gastro-esophageal reflux disease without esophagitis: Secondary | ICD-10-CM | POA: Insufficient documentation

## 2013-09-01 DIAGNOSIS — M75101 Unspecified rotator cuff tear or rupture of right shoulder, not specified as traumatic: Secondary | ICD-10-CM

## 2013-09-01 HISTORY — PX: SHOULDER OPEN ROTATOR CUFF REPAIR: SHX2407

## 2013-09-01 LAB — CBC
HCT: 38.8 % — ABNORMAL LOW (ref 39.0–52.0)
Hemoglobin: 13.2 g/dL (ref 13.0–17.0)
MCH: 30.5 pg (ref 26.0–34.0)
MCHC: 34 g/dL (ref 30.0–36.0)
MCV: 89.6 fL (ref 78.0–100.0)
Platelets: 224 10*3/uL (ref 150–400)
RBC: 4.33 MIL/uL (ref 4.22–5.81)
RDW: 12.9 % (ref 11.5–15.5)
WBC: 7 10*3/uL (ref 4.0–10.5)

## 2013-09-01 LAB — CREATININE, SERUM
Creatinine, Ser: 1.1 mg/dL (ref 0.50–1.35)
GFR calc Af Amer: 81 mL/min — ABNORMAL LOW (ref >90)
GFR calc non Af Amer: 70 mL/min — ABNORMAL LOW (ref >90)

## 2013-09-01 SURGERY — REPAIR, ROTATOR CUFF, OPEN
Anesthesia: General | Site: Shoulder | Laterality: Right

## 2013-09-01 MED ORDER — HYDROMORPHONE HCL PF 1 MG/ML IJ SOLN
0.5000 mg | INTRAMUSCULAR | Status: DC | PRN
  Administered 2013-09-01: 0.5 mg via INTRAVENOUS
  Filled 2013-09-01: qty 1

## 2013-09-01 MED ORDER — MENTHOL 3 MG MT LOZG
1.0000 | LOZENGE | OROMUCOSAL | Status: DC | PRN
  Filled 2013-09-01: qty 9

## 2013-09-01 MED ORDER — SUCCINYLCHOLINE CHLORIDE 20 MG/ML IJ SOLN
INTRAMUSCULAR | Status: DC | PRN
  Administered 2013-09-01: 100 mg via INTRAVENOUS

## 2013-09-01 MED ORDER — NEOSTIGMINE METHYLSULFATE 1 MG/ML IJ SOLN
INTRAMUSCULAR | Status: AC
  Filled 2013-09-01: qty 10

## 2013-09-01 MED ORDER — SODIUM CHLORIDE 0.45 % IV SOLN
INTRAVENOUS | Status: DC
  Administered 2013-09-01: 15:00:00 via INTRAVENOUS

## 2013-09-01 MED ORDER — CEFAZOLIN SODIUM-DEXTROSE 2-3 GM-% IV SOLR
2.0000 g | Freq: Four times a day (QID) | INTRAVENOUS | Status: AC
  Administered 2013-09-01 – 2013-09-02 (×3): 2 g via INTRAVENOUS
  Filled 2013-09-01 (×3): qty 50

## 2013-09-01 MED ORDER — PANTOPRAZOLE SODIUM 40 MG PO TBEC
40.0000 mg | DELAYED_RELEASE_TABLET | Freq: Every day | ORAL | Status: DC
  Administered 2013-09-02: 40 mg via ORAL
  Filled 2013-09-01: qty 1

## 2013-09-01 MED ORDER — HYDROMORPHONE HCL PF 2 MG/ML IJ SOLN
INTRAMUSCULAR | Status: AC
  Filled 2013-09-01: qty 1

## 2013-09-01 MED ORDER — ACETAMINOPHEN 650 MG RE SUPP: 650.0000 mg | Freq: Four times a day (QID) | RECTAL | Status: DC | PRN

## 2013-09-01 MED ORDER — FENTANYL CITRATE 0.05 MG/ML IJ SOLN
INTRAMUSCULAR | Status: AC
  Filled 2013-09-01: qty 2

## 2013-09-01 MED ORDER — ACETAMINOPHEN 10 MG/ML IV SOLN
1000.0000 mg | Freq: Once | INTRAVENOUS | Status: DC
Start: 2013-09-01 — End: 2013-09-01
  Filled 2013-09-01: qty 100

## 2013-09-01 MED ORDER — ONDANSETRON HCL 4 MG PO TABS: 4.0000 mg | ORAL_TABLET | Freq: Four times a day (QID) | ORAL | Status: DC | PRN

## 2013-09-01 MED ORDER — BUPIVACAINE-EPINEPHRINE 0.5% -1:200000 IJ SOLN
INTRAMUSCULAR | Status: AC
  Filled 2013-09-01: qty 1

## 2013-09-01 MED ORDER — METOCLOPRAMIDE HCL 5 MG/ML IJ SOLN
5.0000 mg | Freq: Three times a day (TID) | INTRAMUSCULAR | Status: DC | PRN
  Administered 2013-09-01: 10 mg via INTRAVENOUS
  Filled 2013-09-01: qty 2

## 2013-09-01 MED ORDER — CEFAZOLIN SODIUM-DEXTROSE 2-3 GM-% IV SOLR
2.0000 g | INTRAVENOUS | Status: AC
  Administered 2013-09-01: 2 g via INTRAVENOUS

## 2013-09-01 MED ORDER — MIDAZOLAM HCL 2 MG/2ML IJ SOLN
INTRAMUSCULAR | Status: AC
  Filled 2013-09-01: qty 2

## 2013-09-01 MED ORDER — HYDROMORPHONE HCL PF 1 MG/ML IJ SOLN: 0.2500 mg | INTRAMUSCULAR | Status: DC | PRN

## 2013-09-01 MED ORDER — DEXAMETHASONE SODIUM PHOSPHATE 10 MG/ML IJ SOLN
INTRAMUSCULAR | Status: AC
  Filled 2013-09-01: qty 1

## 2013-09-01 MED ORDER — ENOXAPARIN SODIUM 40 MG/0.4ML ~~LOC~~ SOLN
40.0000 mg | SUBCUTANEOUS | Status: DC
  Administered 2013-09-02: 40 mg via SUBCUTANEOUS
  Filled 2013-09-01 (×2): qty 0.4

## 2013-09-01 MED ORDER — MIDAZOLAM HCL 5 MG/5ML IJ SOLN
INTRAMUSCULAR | Status: DC | PRN
  Administered 2013-09-01: 2 mg via INTRAVENOUS

## 2013-09-01 MED ORDER — PROPOFOL 10 MG/ML IV BOLUS
INTRAVENOUS | Status: AC
  Filled 2013-09-01: qty 20

## 2013-09-01 MED ORDER — KETOROLAC TROMETHAMINE 10 MG PO TABS: 10.0000 mg | ORAL_TABLET | Freq: Four times a day (QID) | ORAL | Status: DC | PRN

## 2013-09-01 MED ORDER — CHLORHEXIDINE GLUCONATE 4 % EX LIQD: 60.0000 mL | Freq: Once | CUTANEOUS | Status: DC

## 2013-09-01 MED ORDER — DOCUSATE SODIUM 100 MG PO CAPS
100.0000 mg | ORAL_CAPSULE | Freq: Two times a day (BID) | ORAL | Status: DC
  Administered 2013-09-01 – 2013-09-02 (×2): 100 mg via ORAL

## 2013-09-01 MED ORDER — NEOSTIGMINE METHYLSULFATE 1 MG/ML IJ SOLN
INTRAMUSCULAR | Status: DC | PRN
  Administered 2013-09-01: 3 mg via INTRAVENOUS

## 2013-09-01 MED ORDER — LIDOCAINE HCL (CARDIAC) 20 MG/ML IV SOLN
INTRAVENOUS | Status: AC
  Filled 2013-09-01: qty 5

## 2013-09-01 MED ORDER — ROCURONIUM BROMIDE 100 MG/10ML IV SOLN
INTRAVENOUS | Status: AC
  Filled 2013-09-01: qty 1

## 2013-09-01 MED ORDER — KETOROLAC TROMETHAMINE 30 MG/ML IJ SOLN
30.0000 mg | Freq: Four times a day (QID) | INTRAMUSCULAR | Status: DC
  Administered 2013-09-01 – 2013-09-02 (×3): 30 mg via INTRAVENOUS
  Filled 2013-09-01 (×7): qty 1

## 2013-09-01 MED ORDER — DEXAMETHASONE SODIUM PHOSPHATE 10 MG/ML IJ SOLN
INTRAMUSCULAR | Status: DC | PRN
  Administered 2013-09-01: 10 mg via INTRAVENOUS

## 2013-09-01 MED ORDER — LIDOCAINE HCL (CARDIAC) 20 MG/ML IV SOLN
INTRAVENOUS | Status: DC | PRN
  Administered 2013-09-01: 50 mg via INTRAVENOUS

## 2013-09-01 MED ORDER — FENTANYL CITRATE 0.05 MG/ML IJ SOLN
INTRAMUSCULAR | Status: DC | PRN
  Administered 2013-09-01 (×2): 50 ug via INTRAVENOUS

## 2013-09-01 MED ORDER — HYDROMORPHONE HCL PF 1 MG/ML IJ SOLN
INTRAMUSCULAR | Status: DC | PRN
  Administered 2013-09-01: 0.5 mg via INTRAVENOUS
  Administered 2013-09-01: 1 mg via INTRAVENOUS
  Administered 2013-09-01: 0.5 mg via INTRAVENOUS

## 2013-09-01 MED ORDER — HYDROCHLOROTHIAZIDE 12.5 MG PO CAPS
12.5000 mg | ORAL_CAPSULE | Freq: Every day | ORAL | Status: DC
  Administered 2013-09-02: 12.5 mg via ORAL
  Filled 2013-09-01: qty 1

## 2013-09-01 MED ORDER — PHENOL 1.4 % MT LIQD
1.0000 | OROMUCOSAL | Status: DC | PRN
  Filled 2013-09-01: qty 177

## 2013-09-01 MED ORDER — GLYCOPYRROLATE 0.2 MG/ML IJ SOLN
INTRAMUSCULAR | Status: AC
  Filled 2013-09-01: qty 2

## 2013-09-01 MED ORDER — ASPIRIN EC 81 MG PO TBEC
81.0000 mg | DELAYED_RELEASE_TABLET | Freq: Every day | ORAL | Status: DC
  Administered 2013-09-01 – 2013-09-02 (×2): 81 mg via ORAL
  Filled 2013-09-01 (×2): qty 1

## 2013-09-01 MED ORDER — METHOCARBAMOL 500 MG PO TABS: 500.0000 mg | ORAL_TABLET | Freq: Three times a day (TID) | ORAL | Status: DC | PRN

## 2013-09-01 MED ORDER — ACETAMINOPHEN 325 MG PO TABS: 650.0000 mg | ORAL_TABLET | Freq: Four times a day (QID) | ORAL | Status: DC | PRN

## 2013-09-01 MED ORDER — 0.9 % SODIUM CHLORIDE (POUR BTL) OPTIME
TOPICAL | Status: DC | PRN
  Administered 2013-09-01: 1000 mL

## 2013-09-01 MED ORDER — KETOROLAC TROMETHAMINE 30 MG/ML IJ SOLN
INTRAMUSCULAR | Status: AC
  Filled 2013-09-01: qty 1

## 2013-09-01 MED ORDER — LACTATED RINGERS IV SOLN
INTRAVENOUS | Status: DC
  Administered 2013-09-01 (×2): via INTRAVENOUS

## 2013-09-01 MED ORDER — LACTATED RINGERS IV SOLN: INTRAVENOUS | Status: DC

## 2013-09-01 MED ORDER — LOSARTAN POTASSIUM-HCTZ 100-12.5 MG PO TABS
1.0000 | ORAL_TABLET | Freq: Every day | ORAL | Status: DC
Start: 2013-09-02 — End: 2013-09-01

## 2013-09-01 MED ORDER — ONDANSETRON HCL 4 MG/2ML IJ SOLN
INTRAMUSCULAR | Status: AC
  Filled 2013-09-01: qty 2

## 2013-09-01 MED ORDER — METOCLOPRAMIDE HCL 10 MG PO TABS: 5.0000 mg | ORAL_TABLET | Freq: Three times a day (TID) | ORAL | Status: DC | PRN

## 2013-09-01 MED ORDER — CEFAZOLIN SODIUM-DEXTROSE 2-3 GM-% IV SOLR
INTRAVENOUS | Status: AC
  Filled 2013-09-01: qty 50

## 2013-09-01 MED ORDER — OXYCODONE-ACETAMINOPHEN 7.5-325 MG PO TABS: 1.0000 | ORAL_TABLET | ORAL | Status: DC | PRN

## 2013-09-01 MED ORDER — ONDANSETRON HCL 4 MG/2ML IJ SOLN: 4.0000 mg | Freq: Four times a day (QID) | INTRAMUSCULAR | Status: DC | PRN

## 2013-09-01 MED ORDER — METHOCARBAMOL 100 MG/ML IJ SOLN
500.0000 mg | Freq: Four times a day (QID) | INTRAMUSCULAR | Status: DC | PRN
  Administered 2013-09-01: 500 mg via INTRAVENOUS
  Filled 2013-09-01: qty 5

## 2013-09-01 MED ORDER — KETOROLAC TROMETHAMINE 30 MG/ML IJ SOLN
INTRAMUSCULAR | Status: DC | PRN
  Administered 2013-09-01: 30 mg via INTRAVENOUS

## 2013-09-01 MED ORDER — ROCURONIUM BROMIDE 100 MG/10ML IV SOLN
INTRAVENOUS | Status: DC | PRN
  Administered 2013-09-01: 25 mg via INTRAVENOUS
  Administered 2013-09-01: 5 mg via INTRAVENOUS

## 2013-09-01 MED ORDER — GLYCOPYRROLATE 0.2 MG/ML IJ SOLN
INTRAMUSCULAR | Status: DC | PRN
  Administered 2013-09-01: 0.4 mg via INTRAVENOUS

## 2013-09-01 MED ORDER — SODIUM CHLORIDE 0.9 % IR SOLN
Status: DC | PRN
  Administered 2013-09-01: 14:00:00

## 2013-09-01 MED ORDER — LOSARTAN POTASSIUM 50 MG PO TABS
100.0000 mg | ORAL_TABLET | Freq: Every day | ORAL | Status: DC
  Administered 2013-09-02: 100 mg via ORAL
  Filled 2013-09-01: qty 2

## 2013-09-01 MED ORDER — PROPOFOL 10 MG/ML IV BOLUS
INTRAVENOUS | Status: DC | PRN
  Administered 2013-09-01: 180 mg via INTRAVENOUS

## 2013-09-01 MED ORDER — OXYCODONE-ACETAMINOPHEN 5-325 MG PO TABS
1.0000 | ORAL_TABLET | ORAL | Status: DC | PRN
  Administered 2013-09-01 – 2013-09-02 (×5): 2 via ORAL
  Filled 2013-09-01 (×5): qty 2

## 2013-09-01 MED ORDER — BUPIVACAINE-EPINEPHRINE 0.5% -1:200000 IJ SOLN
INTRAMUSCULAR | Status: DC | PRN
  Administered 2013-09-01: 20 mL

## 2013-09-01 MED ORDER — ONDANSETRON HCL 4 MG/2ML IJ SOLN
INTRAMUSCULAR | Status: DC | PRN
  Administered 2013-09-01: 4 mg via INTRAVENOUS

## 2013-09-01 MED ORDER — METHOCARBAMOL 500 MG PO TABS
500.0000 mg | ORAL_TABLET | Freq: Four times a day (QID) | ORAL | Status: DC | PRN
  Administered 2013-09-02: 500 mg via ORAL
  Filled 2013-09-01: qty 1

## 2013-09-01 SURGICAL SUPPLY — 52 items
ANCH SUT PUSHLCK 24X4.5 STRL (Orthopedic Implant) ×2 IMPLANT
ANCH SUT SWLK 19.1X4.75 VT (Anchor) ×2 IMPLANT
ANCHOR NDL 9/16 CIR SZ 8 (NEEDLE) ×1 IMPLANT
ANCHOR NEEDLE 9/16 CIR SZ 8 (NEEDLE) ×3 IMPLANT
ANCHOR PEEK 4.75X19.1 SWLK C (Anchor) ×4 IMPLANT
BAG SPEC THK2 15X12 ZIP CLS (MISCELLANEOUS) ×1
BAG ZIPLOCK 12X15 (MISCELLANEOUS) ×3 IMPLANT
BLADE OSCILLATING/SAGITTAL (BLADE) ×3
BLADE SW THK.38XMED LNG THN (BLADE) IMPLANT
BUR OVAL CARBIDE 4.0 (BURR) ×2 IMPLANT
CHLORAPREP W/TINT 26ML (MISCELLANEOUS) IMPLANT
CLEANER TIP ELECTROSURG 2X2 (MISCELLANEOUS) ×3 IMPLANT
CLOSURE WOUND 1/2 X4 (GAUZE/BANDAGES/DRESSINGS) ×1
CLOTH 2% CHLOROHEXIDINE 3PK (PERSONAL CARE ITEMS) ×3 IMPLANT
DECANTER SPIKE VIAL GLASS SM (MISCELLANEOUS) ×3 IMPLANT
DRAPE ORTHO SPLIT 77X108 STRL (DRAPES) ×3
DRAPE POUCH INSTRU U-SHP 10X18 (DRAPES) ×3 IMPLANT
DRAPE SURG ORHT 6 SPLT 77X108 (DRAPES) ×1 IMPLANT
DRSG AQUACEL AG ADV 3.5X 4 (GAUZE/BANDAGES/DRESSINGS) ×2 IMPLANT
DRSG AQUACEL AG ADV 3.5X 6 (GAUZE/BANDAGES/DRESSINGS) IMPLANT
DURAPREP 26ML APPLICATOR (WOUND CARE) ×3 IMPLANT
ELECT NDL TIP 2.8 STRL (NEEDLE) ×1 IMPLANT
ELECT NEEDLE TIP 2.8 STRL (NEEDLE) ×3 IMPLANT
ELECT REM PT RETURN 9FT ADLT (ELECTROSURGICAL) ×3
ELECTRODE REM PT RTRN 9FT ADLT (ELECTROSURGICAL) ×1 IMPLANT
GLOVE BIOGEL PI IND STRL 7.5 (GLOVE) ×1 IMPLANT
GLOVE BIOGEL PI IND STRL 8 (GLOVE) ×1 IMPLANT
GLOVE BIOGEL PI INDICATOR 7.5 (GLOVE) ×2
GLOVE BIOGEL PI INDICATOR 8 (GLOVE) ×2
GLOVE SURG SS PI 7.5 STRL IVOR (GLOVE) ×3 IMPLANT
GLOVE SURG SS PI 8.0 STRL IVOR (GLOVE) ×6 IMPLANT
GOWN STRL REUS W/TWL XL LVL3 (GOWN DISPOSABLE) ×8 IMPLANT
KIT BASIN OR (CUSTOM PROCEDURE TRAY) ×3 IMPLANT
KIT POSITION SHOULDER SCHLEI (MISCELLANEOUS) ×3 IMPLANT
MANIFOLD NEPTUNE II (INSTRUMENTS) ×3 IMPLANT
NDL SCORPION MULTI FIRE (NEEDLE) IMPLANT
NEEDLE SCORPION MULTI FIRE (NEEDLE) ×3 IMPLANT
PACK SHOULDER CUSTOM OPM052 (CUSTOM PROCEDURE TRAY) ×3 IMPLANT
POSITIONER SURGICAL ARM (MISCELLANEOUS) ×3 IMPLANT
PUSHLOCK PEEK 4.5X24 (Orthopedic Implant) ×4 IMPLANT
SLING ARM IMMOBILIZER LRG (SOFTGOODS) IMPLANT
SLING ARM LRG ADULT FOAM STRAP (SOFTGOODS) ×2 IMPLANT
SLING ULTRA II L (ORTHOPEDIC SUPPLIES) ×2 IMPLANT
SPONGE LAP 4X18 X RAY DECT (DISPOSABLE) IMPLANT
STRIP CLOSURE SKIN 1/2X4 (GAUZE/BANDAGES/DRESSINGS) ×1 IMPLANT
SUT BONE WAX W31G (SUTURE) ×3 IMPLANT
SUT ETHIBOND NAB CT1 #1 30IN (SUTURE) IMPLANT
SUT PROLENE 3 0 PS 2 (SUTURE) ×3 IMPLANT
SUT TIGER TAPE 7 IN WHITE (SUTURE) ×2 IMPLANT
SUT VIC AB 1-0 CT2 27 (SUTURE) ×6 IMPLANT
SUT VIC AB 2-0 CT2 27 (SUTURE) ×3 IMPLANT
TAPE FIBER 2MM 7IN #2 BLUE (SUTURE) ×2 IMPLANT

## 2013-09-01 NOTE — Brief Op Note (Signed)
09/01/2013  2:34 PM  PATIENT:  Candie Mile  63 y.o. male  PRE-OPERATIVE DIAGNOSIS:  rotator cuff tear on the right  POST-OPERATIVE DIAGNOSIS:  rotator cuff tear on the right  PROCEDURE:  Procedure(s): RIGHT SHOULDER MINI OPEN ROTATOR CUFF REPAIR WITH SUBACROMIAL DECOMPRESSION (Right)  SURGEON:  Surgeon(s) and Role:    * [name], MD - Primary  PHYSICIAN ASSISTANT:   ASSISTANTS: Bissell   ANESTHESIA:   general  EBL:  Total I/O In: 1000 [I.V.:1000] Out: -   BLOOD ADMINISTERED:none  DRAINS: none   LOCAL MEDICATIONS USED:  MARCAINE     SPECIMEN:  No Specimen  DISPOSITION OF SPECIMEN:  N/A  COUNTS:  YES  TOURNIQUET:  * No tourniquets in log *  DICTATION: .Other Dictation: Dictation Number [id number]  PLAN OF CARE: Admit for overnight observation  PATIENT DISPOSITION:  PACU - hemodynamically stable.   Delay start of Pharmacological VTE agent (>24hrs) due to surgical blood loss or risk of bleeding: no

## 2013-09-01 NOTE — Anesthesia Postprocedure Evaluation (Signed)
  Anesthesia Post-op Note  Patient: [name]  Procedure(s) Performed: Procedure(s) (LRB): RIGHT SHOULDER MINI OPEN ROTATOR CUFF REPAIR WITH SUBACROMIAL DECOMPRESSION (Right)  Patient Location: PACU  Anesthesia Type: General  Level of Consciousness: awake and alert   Airway and Oxygen Therapy: Patient Spontanous Breathing  Post-op Pain: mild  Post-op Assessment: Post-op Vital signs reviewed, Patient's Cardiovascular Status Stable, Respiratory Function Stable, Patent Airway and No signs of Nausea or vomiting  Last Vitals:  Filed Vitals:   09/01/13 1700  BP: 133/83  Pulse: 80  Temp: 36.4 C  Resp: 18    Post-op Vital Signs: stable   Complications: No apparent anesthesia complications

## 2013-09-01 NOTE — Interval H&P Note (Signed)
History and Physical Interval Note:  09/01/2013 12:55 PM  [name]  has presented today for surgery, with the diagnosis of rotator cuff tear on the right  The various methods of treatment have been discussed with the patient and family. After consideration of risks, benefits and other options for treatment, the patient has consented to  Procedure(s): RIGHT SHOULDER MINI OPEN ROTATOR CUFF REPAIR WITH SUBACROMIAL DECOMPRESSION (Right) as a surgical intervention .  The patient's history has been reviewed, patient examined, no change in status, stable for surgery.  I have reviewed the patient's chart and labs.  Questions were answered to the patient's satisfaction.     Kasumi Ditullio C

## 2013-09-01 NOTE — Transfer of Care (Signed)
Immediate Anesthesia Transfer of Care Note  Patient: [name]  Procedure(s) Performed: Procedure(s): RIGHT SHOULDER MINI OPEN ROTATOR CUFF REPAIR WITH SUBACROMIAL DECOMPRESSION (Right)  Patient Location: PACU  Anesthesia Type:General  Level of Consciousness: awake and alert   Airway & Oxygen Therapy: Patient Spontanous Breathing and Patient connected to face mask oxygen  Post-op Assessment: Report given to PACU RN and Post -op Vital signs reviewed and stable  Post vital signs: Reviewed and stable  Complications: No apparent anesthesia complications

## 2013-09-01 NOTE — Anesthesia Preprocedure Evaluation (Addendum)
Anesthesia Evaluation  Patient identified by MRN, date of birth, ID band Patient awake    Reviewed: Allergy & Precautions, H&P , NPO status , Patient's Chart, lab work & pertinent test results  Airway Mallampati: II TM Distance: >3 FB Neck ROM: full    Dental no notable dental hx. (+) Teeth Intact and Dental Advisory Given   Pulmonary neg pulmonary ROS,  breath sounds clear to auscultation  Pulmonary exam normal       Cardiovascular Exercise Tolerance: Good hypertension, Pt. on medications Rhythm:regular Rate:Normal     Neuro/Psych negative neurological ROS  negative psych ROS   GI/Hepatic negative GI ROS, Neg liver ROS, GERD-  Medicated and Controlled,  Endo/Other  negative endocrine ROS  Renal/GU negative Renal ROS  negative genitourinary   Musculoskeletal   Abdominal   Peds  Hematology negative hematology ROS (+)   Anesthesia Other Findings   Reproductive/Obstetrics negative OB ROS                          Anesthesia Physical Anesthesia Plan  ASA: II  Anesthesia Plan: General   Post-op Pain Management:    Induction: Intravenous  Airway Management Planned: Oral ETT  Additional Equipment:   Intra-op Plan:   Post-operative Plan: Extubation in OR  Informed Consent: I have reviewed the patients History and Physical, chart, labs and discussed the procedure including the risks, benefits and alternatives for the proposed anesthesia with the patient or authorized representative who has indicated his/her understanding and acceptance.   Dental Advisory Given  Plan Discussed with: CRNA and Surgeon  Anesthesia Plan Comments:         Anesthesia Quick Evaluation

## 2013-09-01 NOTE — Discharge Instructions (Signed)
Change dressing daily, keep wound clean and dry. Use sling at times except when exercising or showering Ok to shower with aquacel dressing in place. May remove aquacel after 7 days. Keep steri strips in place. After aquacel dressing is removed, place gauze dressing or band-aids, change daily, keep clean and dry No driving for 4-6 weeks No lifting for 6 weeks operative arm Pendulum exercises as instructed. Ok to move wrist,elbow, and hand. See Dr. Tonita Cong in 10-14 days. Take one aspirin per day with a meal if not on a blood thinner or allergic to aspirin.

## 2013-09-01 NOTE — H&P (View-Only) (Signed)
[name] is an 63 y.o. male.   Chief Complaint: right shoulder pain HPI: The patient is a 63 year old male being followed for their right shoulder pain. They are 11 week(s) out from when symptoms began. The patient reports that the shoulder symptoms began in association with a new activity. The activity involved scuba diving, weight from the tank and the vest.  Symptoms reported today include: pain. and report their pain level to be severe. Current treatment includes: NSAIDs (Ibuprofen prn). The following medication has been used for pain control: Ultram. The patient presents today following MRI. The patient has reported improvement of their symptoms with: Cortisone injections (helped for 2 weeks).  No past medical history on file.  No past surgical history on file.  No family history on file. Social History:  reports that he has never smoked. He does not have any smokeless tobacco history on file. He reports that he does not drink alcohol or use illicit drugs.  Allergies:  Allergies  Allergen Reactions  . Codeine Itching     (Not in a hospital admission)  No results found for this or any previous visit (from the past 48 hour(s)). No results found.  Review of Systems  Constitutional: Negative.   HENT: Negative.   Eyes: Negative.   Respiratory: Negative.   Cardiovascular: Negative.   Gastrointestinal: Negative.   Genitourinary: Negative.   Musculoskeletal: Positive for joint pain.  Skin: Negative.   Neurological: Negative.   Psychiatric/Behavioral: Negative.     There were no vitals taken for this visit. Physical Exam  Constitutional: He is oriented to person, place, and time. He appears well-developed and well-nourished.  HENT:  Head: Normocephalic and atraumatic.  Eyes: Conjunctivae and EOM are normal. Pupils are equal, round, and reactive to light.  Neck: Normal range of motion. Neck supple.  Cardiovascular: Normal rate and regular rhythm.   Respiratory: Effort  normal and breath sounds normal.  GI: Soft. Bowel sounds are normal.  Musculoskeletal:  On exam he actually has good forward flexion, though he has pain with abduction, internal rotation. He has some weakness in external rotation.  Inspection of the cervical spine reveals a normal lordosis without evidence of paraspinous spasms or soft tissue swelling. Nontender to palpation. Extension combined with lateral flexion does not reproduce pain. Negative impingement sign, negative secondary impingement sign of the shoulders. Negative Tinel's at median and ulnar nerves at the elbow. Negative carpal compression test at the wrists. Motor of the upper extremities is 5/5 including biceps, triceps, brachioradialis,wrist flexion, wrist extension, finger flexion, finger extension. Reflexes are normoreflexic. Sensory exam is intact to light touch. There is no Hoffmann sign. Nontender over the thoracic spine.  The contralateral shoulder minimal impingement is noted. Re-examining him, he has no symptoms referable to his [facility] joint.  Neurological: He is alert and oriented to person, place, and time. He has normal reflexes.  Skin: Skin is warm and dry.  Psychiatric: He has a normal mood and affect.    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  Assessment/Plan Right shoulder Full thickness retracted tear of the rotator cuff, mild atrophy.  Discussed options to proceed with a repair. We discussed the etiology of this again. He was just scuba diving, he had no traumatic event. He felt no symptoms at the time. He had pain that night. He came in three weeks after that for pain. We gave him an injection and instructed him to call back if  it was not efficacious for an MRI. It was about four weeks until he called back for his MRI and the MRI has shown that he actually has a tear. He might have had significant partial tear that completed without significant trauma. In  any event he will have to proceed accordingly. He would like to do this as an outpatient and to go home the same day, so we can use a block. No history of MRSA.  Lacie Draft M. for Dr. Tonita Cong 08/15/2013, 5:02 PM

## 2013-09-02 NOTE — Evaluation (Signed)
Occupational Therapy Evaluation and Discharge Patient Details Name: [name] MRN: [id number] DOB: 10-Apr-1951 Today's Date: 09/02/2013 Time: 1696-7893 OT Time Calculation (min): 25 min  OT Assessment / Plan / Recommendation History of present illness Mini-open rotator cuff repair of massive tear. Subacromial decompression. Acromioplasty   Clinical Impression   This 63 yo male admitted and underwent above presents to acute OT with all education completed. Pt without mobility issues and I taught pt pendulums so no PT needs identified, will let them know. Acute OT will sign off.    OT Assessment  Progress rehab of shoulder as ordered by MD at follow-up appointment    Follow Up Recommendations  No OT follow up       Equipment Recommendations  None recommended by OT          Precautions / Restrictions Precautions Precautions: Shoulder Shoulder Interventions: Shoulder sling/immobilizer;At all times;Off for dressing/bathing/exercises Required Braces or Orthoses: Sling Restrictions Weight Bearing Restrictions: Yes RUE Weight Bearing: Non weight bearing   Pertinent Vitals/Pain 4/10 sore shoulder; repositioned     Acute Rehab OT Goals Patient Stated Goal: Home today  Visit Information  Last OT Received On: 09/02/13 Assistance Needed: +1 History of Present Illness: Mini-open rotator cuff repair of massive tear. Subacromial decompression. Acromioplasty       Prior [place] expects to be discharged to:: Private residence Living Arrangements: Spouse/significant other Available Help at Discharge: Family;Available 24 hours/day (1st week, then after work) Prior Function Level of Independence: Independent Communication Communication: No difficulties Dominant Hand: Right         Vision/Perception Vision - History Patient Visual Report: No change from baseline   Cognition  Cognition Arousal/Alertness: Awake/alert Behavior  During Therapy: WFL for tasks assessed/performed Overall Cognitive Status: Within Functional Limits for tasks assessed    Extremity/Trunk Assessment Upper Extremity Assessment Upper Extremity Assessment: RUE deficits/detail RUE Deficits / Details: shoulder surgery RUE Coordination: decreased gross motor     Mobility Bed Mobility Overal bed mobility: Modified Independent Transfers Overall transfer level: Independent     Exercise Donning/doffing shirt without moving shoulder:  (verbalized understanding post demo) Method for sponge bathing under operated UE:  (verbalized understanding post demo) Donning/doffing sling/immobilizer: Modified independent Correct positioning of sling/immobilizer: Independent Pendulum exercises (written home exercise program): Supervision/safety ROM for elbow, wrist and digits of operated UE: Supervision/safety Sling wearing schedule (on at all times/off for ADL's): Independent Proper positioning of operated UE when showering: Independent Dressing change:  (NA) Positioning of UE while sleeping:  (verbalized understanding post demo)      End of Session OT - End of Session Equipment Utilized During Treatment:  (sling) Activity Tolerance: Patient tolerated treatment well Patient left:  (sitting EOB)  GO Functional Assessment Tool Used: Clinical observation Functional Limitation: Self care Self Care Current Status (Y1017): At least 1 percent but less than 20 percent impaired, limited or restricted Self Care Goal Status (P1025): At least 1 percent but less than 20 percent impaired, limited or restricted Self Care Discharge Status [phone]): At least 1 percent but less than 20 percent impaired, limited or restricted   Almon Register [phone] 09/02/2013, 8:51 AM

## 2013-09-02 NOTE — Progress Notes (Signed)
Pt to d/c home. AVS reviewed and "My Chart" discussed with pt. Pt capable of verbalizing medications, dressing changes, signs and symptoms of infection, and follow-up appointments. Remains hemodynamically stable. No signs and symptoms of distress. Educated pt to return to ER in the case of SOB, dizziness, or chest pain.  

## 2013-09-02 NOTE — Progress Notes (Signed)
   Subjective: 1 Day Post-Op Procedure(s) (LRB): RIGHT SHOULDER MINI OPEN ROTATOR CUFF REPAIR WITH SUBACROMIAL DECOMPRESSION (Right)   Patient reports pain as mild, pain controlled. No events throughout the night. Ready to be discharged home.  Objective:   VITALS:   Filed Vitals:   09/02/13 0500  BP: 119/68  Pulse: 72  Temp: 97.8 F (36.6 C)  Resp: 18    Neurovascular intact Dorsiflexion/Plantar flexion intact Incision: dressing C/D/I No cellulitis present Compartment soft  LABS  Recent Labs  09/01/13 1654  HGB 13.2  HCT 38.8*  WBC 7.0  PLT 224     Recent Labs  09/01/13 1654  CREATININE 1.10     Assessment/Plan: 1 Day Post-Op Procedure(s) (LRB): RIGHT SHOULDER MINI OPEN ROTATOR CUFF REPAIR WITH SUBACROMIAL DECOMPRESSION (Right) Up with therapy Discharge home with home health Follow up in 2 weeks at [facility]. Follow up with Dr. Tonita Cong in 2 weeks.  Contact information:  [facility] [address], [address] [place] Osei Anger   PAC  09/02/2013, 9:28 AM

## 2013-09-02 NOTE — Op Note (Signed)
[name], [name]             ACCOUNT NO.:  [id number]  MEDICAL RECORD NO.:  [id number]  LOCATION:  7619                         FACILITY:  [facility]  PHYSICIAN:  Susa Day, M.D.    DATE OF BIRTH:  1951-07-17  DATE OF PROCEDURE:  09/01/2013 DATE OF DISCHARGE:                              OPERATIVE REPORT   PREOPERATIVE DIAGNOSIS:  Massive tear of the rotator cuff on the right shoulder.  POSTOPERATIVE DIAGNOSIS:  Massive tear of the rotator cuff on the right shoulder.  PROCEDURES PERFORMED: 1. Mini-open rotator cuff repair of massive. 2. Subacromial decompression. 3. Acromioplasty.  ANESTHESIA:  General.  ASSISTANT:  Cleophas Dunker, PA.  HISTORY:  This is a 63 year old, pain in shoulder, MRI indicating retracted tear supraspinatus and anterior leaflet of the infraspinatus, AC arthrosis, is very asymptomatic, indicated for repair, subacromial decompression.  Risk and benefits discussed including bleeding, infection, damage to neurovascular structures, suboptimal range of motion, recurrent tear, protracted postoperative course etc.  TECHNIQUE:  With the patient in supine and beach-chair position after induction of adequate general anesthesia, 2 g Kefzol, the right shoulder and upper extremity prepped and draped in usual sterile fashion. Examined under anesthesia.  He had full range.  Surgical marker was utilized to delineate the acromion.  Standard posterolateral 2 cm incision was made over the anterolateral aspect of the acromion. Subcutaneous tissue was dissected.  Electrocautery was utilized to achieve hemostasis.  A 0.25% Marcaine with epinephrine was infiltrated in the deltoid.  Raphe identified and divided in line with the skin incision.  Self-retaining retractor was placed.  Acromioplasty was performed with a 3-mm Kerrison.  We resected the CA ligament.  Digitally lysed adhesions in the subacromial space.  Full-thickness tear and exposed humeral head was  noted.  We mobilized the cuff posteriorly, the supraspinatus as well as of the supraspinatus.  We made a trough in the lateral to the articular surface.  Good cancellous bone was noted, and we extended that trough posteriorly and anteriorly.  Distal lateral aspect of the bicipital groove.  We mobilized the cuff posteriorly and anteriorly from the bursal and the articular side with a small Cobb.  We felt that the configuration was to advance the infraspinatus anterolaterally and the supraspinatus posterolaterally.  We then placed 2 suture anchors in the trough, a cm apart after utilizing an awl and then placed a swivel lock advancing the threaded swivel lock into the bone with excellent resistance to pull out.  We used a TigerTape and then passed it posteriorly in the infraspinatus with the scorpion. Passed the separate FiberTape anteriorly through the supraspinatus portion.  Both were crossed and then we advanced the leaflets towards each other with PushLocks that were placed anterolaterally and posterolaterally securing the 2 leaflets after utilization of an awl placement and the PushLock in appropriate tensioning.  Actually, the 2 leaflets came together in the midline with full coverage of the humeral head following that.  We then reinforced that anatomical line with #1 Vicryl interrupted figure-of-eight sutures and #1 Ethibond.  Full coverage was noted.  Inspection of the cuff revealed some attenuation right at the anastomosis site.  However, a good watertight side-to-side repair was noted.  Wound copiously  irrigated.  Inspection revealed the remainder of the cuff was intact, repaired the raphe with #1 Vicryl, subcu with 2, and skin subcuticular Prolene.  Sterile dressing applied, placed in a sling, extubated without difficulty, and transported to the recovery room in satisfactory condition.  The patient tolerated the procedure well.  No complications.  Assistant, Cleophas Dunker,  [place].  Minimal blood loss.     Susa Day, M.D.     Geralynn Rile  D:  09/01/2013  T:  09/02/2013  Job:  [id number]

## 2013-09-02 NOTE — Discharge Summary (Signed)
Physician Discharge Summary   Patient ID: [name] MRN: [id number] DOB/AGE: 03/06/1951 63 y.o.  Admit date: 09/01/2013 Discharge date: 09/02/2013  Primary Diagnosis:   rotator cuff tear on the right  Admission Diagnoses:  Past Medical History  Diagnosis Date  . Hypertension   . GERD (gastroesophageal reflux disease)     RARE   . Anemia     COUPLE OF YRS AGO- NO PROBLEMS SINCE  . Pain     RIGHT SHOULDER - ROTATOR CUFF TEAR  . Complication of anesthesia     REMEMBERS [facility]   Discharge Diagnoses:   Principal Problem:   Rotator cuff tear, right Active Problems:   Right rotator cuff tear  Procedure:  Procedure(s) (LRB): RIGHT SHOULDER MINI OPEN ROTATOR CUFF REPAIR WITH SUBACROMIAL DECOMPRESSION (Right)   Consults: None  HPI:  see H&P    Laboratory Data: Hospital Outpatient Visit on 08/29/2013  Component Date Value Range Status  . WBC 08/29/2013 6.0  4.0 - 10.5 K/uL Final  . RBC 08/29/2013 4.82  4.22 - 5.81 MIL/uL Final  . Hemoglobin 08/29/2013 15.0  13.0 - 17.0 g/dL Final  . HCT 08/29/2013 43.2  39.0 - 52.0 % Final  . MCV 08/29/2013 89.6  78.0 - 100.0 fL Final  . MCH 08/29/2013 31.1  26.0 - 34.0 pg Final  . MCHC 08/29/2013 34.7  30.0 - 36.0 g/dL Final  . RDW 08/29/2013 13.3  11.5 - 15.5 % Final  . Platelets 08/29/2013 271  150 - 400 K/uL Final  . Sodium 08/29/2013 146  137 - 147 mEq/L Final  . Potassium 08/29/2013 4.7  3.7 - 5.3 mEq/L Final  . Chloride 08/29/2013 107  96 - 112 mEq/L Final  . CO2 08/29/2013 28  19 - 32 mEq/L Final  . Glucose, Bld 08/29/2013 96  70 - 99 mg/dL Final  . BUN 08/29/2013 20  6 - 23 mg/dL Final  . Creatinine, Ser 08/29/2013 1.14  0.50 - 1.35 mg/dL Final  . Calcium 08/29/2013 9.3  8.4 - 10.5 mg/dL Final  . GFR calc non Af Amer 08/29/2013 67* >90 mL/min Final  . GFR calc Af Amer 08/29/2013 78* >90 mL/min Final   Comment: (NOTE)                          The eGFR has been calculated using the CKD EPI  equation.                          This calculation has not been validated in all clinical situations.                          eGFR's persistently <90 mL/min signify possible Chronic Kidney                          Disease.    Recent Labs  09/01/13 1654  HGB 13.2    Recent Labs  09/01/13 1654  WBC 7.0  RBC 4.33  HCT 38.8*  PLT 224    Recent Labs  09/01/13 1654  CREATININE 1.10   No results found for this basename: LABPT, INR,  in the last 72 hours  X-Rays:Dg Chest 2 View  08/29/2013   CLINICAL DATA:  Preop right shoulder surgery.  Hypertension.  EXAM: CHEST  2 VIEW  COMPARISON:  12/30/2004  FINDINGS: Small stable  nodule in the right mid lung likely a granuloma. Lungs are otherwise clear. No pleural effusion or pneumothorax.  Heart, mediastinum and hila are unremarkable.  The bony thorax is intact.  IMPRESSION: No acute cardiopulmonary disease.   Electronically Signed   By: Lajean Manes M.D.   On: 08/29/2013 10:00    EKG: Orders placed during the hospital encounter of 08/29/13  . EKG 12-LEAD  . EKG 12-LEAD     Hospital Course: Patient was admitted to [facility] and taken to the OR and underwent the above state procedure without complications.  Patient tolerated the procedure well and was later transferred to the recovery room and then to the orthopaedic floor for postoperative care.  They were given PO and IV analgesics for pain control following their surgery.  They were given 24 hours of postoperative antibiotics.   PT was consulted postop to assist with mobility and transfers.  The patient was seen by PT. Discharge planning was consulted to help with postop disposition and equipment needs.  Patient had a good night on the evening of surgery and started to get up OOB with therapy on day one. Patient was seen in rounds and was ready to go home on day one.  They were given discharge instructions and dressing directions.  They were instructed on when to follow up in  the office with Dr. Tonita Cong.  Discharge Medications: Prior to Admission medications   Medication Sig Start Date End Date Taking? Authorizing Provider  aspirin EC 81 MG tablet Take 81 mg by mouth daily.   Yes Historical Provider, MD  atorvastatin (LIPITOR) 20 MG tablet Take 20 mg by mouth daily with breakfast.    Yes Historical Provider, MD  Esomeprazole Magnesium (NEXIUM PO) Take by mouth. PT NOT SURE OF DOSE - TAKES ONE EVERY OTHER DAY IN MORNING   Yes Historical Provider, MD  losartan-hydrochlorothiazide (HYZAAR) 100-12.5 MG per tablet Take 1 tablet by mouth daily with breakfast.    Yes Historical Provider, MD  Misc Natural Products (OSTEO BI-FLEX JOINT SHIELD PO) Take 1 tablet by mouth daily.   Yes Historical Provider, MD  Multiple Vitamin (MULTIVITAMIN WITH MINERALS) TABS tablet Take 1 tablet by mouth daily.   Yes Historical Provider, MD  Multiple Vitamins-Iron (MULTIVITAMIN/IRON PO) Take 1 tablet by mouth daily.   Yes Historical Provider, MD  Omega-3 Fatty Acids (FISH OIL PO) Take 1 capsule by mouth daily.   Yes Historical Provider, MD  ketorolac (TORADOL) 10 MG tablet Take 1 tablet (10 mg total) by mouth every 6 (six) hours as needed. 09/01/13   [name], MD  methocarbamol (ROBAXIN) 500 MG tablet Take 1 tablet (500 mg total) by mouth 3 (three) times daily between meals as needed for muscle spasms. 09/01/13   [name], MD  oxyCODONE-acetaminophen (PERCOCET) 7.5-325 MG per tablet Take 1-2 tablets by mouth every 4 (four) hours as needed for pain. 09/01/13   [name], MD    Diet: Regular diet Activity:WBAT Follow-up:in 10-14 days Disposition - Home Discharged Condition: good      Medication List         aspirin EC 81 MG tablet  Take 81 mg by mouth daily.     atorvastatin 20 MG tablet  Commonly known as:  LIPITOR  Take 20 mg by mouth daily with breakfast.     FISH OIL PO  Take 1 capsule by mouth daily.     ketorolac 10 MG tablet  Commonly known as:  TORADOL  Take 1 tablet (10 mg total) by mouth every 6 (six) hours as needed.     losartan-hydrochlorothiazide 100-12.5 MG per tablet  Commonly known as:  HYZAAR  Take 1 tablet by mouth daily with breakfast.     methocarbamol 500 MG tablet  Commonly known as:  ROBAXIN  Take 1 tablet (500 mg total) by mouth 3 (three) times daily between meals as needed for muscle spasms.     multivitamin with minerals Tabs tablet  Take 1 tablet by mouth daily.     MULTIVITAMIN/IRON PO  Take 1 tablet by mouth daily.     NEXIUM PO  Take by mouth. PT NOT SURE OF DOSE - TAKES ONE EVERY OTHER DAY IN MORNING     OSTEO BI-FLEX JOINT SHIELD PO  Take 1 tablet by mouth daily.     oxyCODONE-acetaminophen 7.5-325 MG per tablet  Commonly known as:  PERCOCET  Take 1-2 tablets by mouth every 4 (four) hours as needed for pain.           Follow-up Information   Follow up with BEANE,JEFFREY C, MD In 2 weeks.   Specialty:  Orthopedic Surgery   Contact information:   [address] [place] [address] [phone]       Signed: Cecilie Kicks. 09/02/2013, 11:40 AM

## 2013-09-04 ENCOUNTER — Encounter (HOSPITAL_COMMUNITY): Payer: Self-pay | Admitting: Specialist

## 2015-11-20 NOTE — Patient Instructions (Signed)
Your procedure is scheduled on: 11/26/2015  Report to [facility] at  23 AM.  Call this number if you have problems the morning of surgery: [phone]   Do not eat food or drink liquids :After Midnight.      Take these medicines the morning of surgery with A SIP OF WATER: nexium, losartan.   Do not wear jewelry, make-up or nail polish.  Do not wear lotions, powders, or perfumes. You may wear deodorant.  Do not shave 48 hours prior to surgery.  Do not bring valuables to the hospital.  Contacts, dentures or bridgework may not be worn into surgery.  Leave suitcase in the car. After surgery it may be brought to your room.  For patients admitted to the hospital, checkout time is 11:00 AM the day of discharge.   Patients discharged the day of surgery will not be allowed to drive home.  :     Please read over the following fact sheets that you were given: Coughing and Deep Breathing, Surgical Site Infection Prevention, Anesthesia Post-op Instructions and Care and Recovery After Surgery    Cataract A cataract is a clouding of the lens of the eye. When a lens becomes cloudy, vision is reduced based on the degree and nature of the clouding. Many cataracts reduce vision to some degree. Some cataracts make people more near-sighted as they develop. Other cataracts increase glare. Cataracts that are ignored and become worse can sometimes look white. The white color can be seen through the pupil. CAUSES   Aging. However, cataracts may occur at any age, even in newborns.   Certain drugs.   Trauma to the eye.   Certain diseases such as diabetes.   Specific eye diseases such as chronic inflammation inside the eye or a sudden attack of a rare form of glaucoma.   Inherited or acquired medical problems.  SYMPTOMS   Gradual, progressive drop in vision in the affected eye.   Severe, rapid visual loss. This most often happens when trauma is the cause.  DIAGNOSIS  To detect a cataract, an eye doctor  examines the lens. Cataracts are best diagnosed with an exam of the eyes with the pupils enlarged (dilated) by drops.  TREATMENT  For an early cataract, vision may improve by using different eyeglasses or stronger lighting. If that does not help your vision, surgery is the only effective treatment. A cataract needs to be surgically removed when vision loss interferes with your everyday activities, such as driving, reading, or watching TV. A cataract may also have to be removed if it prevents examination or treatment of another eye problem. Surgery removes the cloudy lens and usually replaces it with a substitute lens (intraocular lens, IOL).  At a time when both you and your doctor agree, the cataract will be surgically removed. If you have cataracts in both eyes, only one is usually removed at a time. This allows the operated eye to heal and be out of danger from any possible problems after surgery (such as infection or poor wound healing). In rare cases, a cataract may be doing damage to your eye. In these cases, your caregiver may advise surgical removal right away. The vast majority of people who have cataract surgery have better vision afterward. HOME CARE INSTRUCTIONS  If you are not planning surgery, you may be asked to do the following:  Use different eyeglasses.   Use stronger or brighter lighting.   Ask your eye doctor about reducing your medicine dose or  changing medicines if it is thought that a medicine caused your cataract. Changing medicines does not make the cataract go away on its own.   Become familiar with your surroundings. Poor vision can lead to injury. Avoid bumping into things on the affected side. You are at a higher risk for tripping or falling.   Exercise extreme care when driving or operating machinery.   Wear sunglasses if you are sensitive to bright light or experiencing problems with glare.  SEEK IMMEDIATE MEDICAL CARE IF:   You have a worsening or sudden vision  loss.   You notice redness, swelling, or increasing pain in the eye.   You have a fever.  Document Released: 08/03/2005 Document Revised: 07/23/2011 Document Reviewed: 03/27/2011 [facility] Patient Information 2012 [place].PATIENT INSTRUCTIONS POST-ANESTHESIA  IMMEDIATELY FOLLOWING SURGERY:  Do not drive or operate machinery for the first twenty four hours after surgery.  Do not make any important decisions for twenty four hours after surgery or while taking narcotic pain medications or sedatives.  If you develop intractable nausea and vomiting or a severe headache please notify your doctor immediately.  FOLLOW-UP:  Please make an appointment with your surgeon as instructed. You do not need to follow up with anesthesia unless specifically instructed to do so.  WOUND CARE INSTRUCTIONS (if applicable):  Keep a dry clean dressing on the anesthesia/puncture wound site if there is drainage.  Once the wound has quit draining you may leave it open to air.  Generally you should leave the bandage intact for twenty four hours unless there is drainage.  If the epidural site drains for more than 36-48 hours please call the anesthesia department.  QUESTIONS?:  Please feel free to call your physician or the hospital operator if you have any questions, and they will be happy to assist you.

## 2015-11-22 ENCOUNTER — Other Ambulatory Visit: Payer: Self-pay

## 2015-11-22 ENCOUNTER — Encounter (HOSPITAL_COMMUNITY)
Admission: RE | Admit: 2015-11-22 | Discharge: 2015-11-22 | Disposition: A | Discharge status: Home / Self Care | Payer: Commercial Managed Care - HMO | Source: Ambulatory Visit | Attending: Ophthalmology | Admitting: Ophthalmology

## 2015-11-22 ENCOUNTER — Encounter (HOSPITAL_COMMUNITY): Payer: Self-pay

## 2015-11-22 DIAGNOSIS — H25041 Posterior subcapsular polar age-related cataract, right eye: Secondary | ICD-10-CM | POA: Insufficient documentation

## 2015-11-22 DIAGNOSIS — Z01812 Encounter for preprocedural laboratory examination: Secondary | ICD-10-CM | POA: Insufficient documentation

## 2015-11-22 DIAGNOSIS — H2511 Age-related nuclear cataract, right eye: Secondary | ICD-10-CM | POA: Insufficient documentation

## 2015-11-22 DIAGNOSIS — Z01810 Encounter for preprocedural cardiovascular examination: Secondary | ICD-10-CM | POA: Diagnosis present

## 2015-11-22 LAB — BASIC METABOLIC PANEL
Anion gap: 9 (ref 5–15)
BUN: 18 mg/dL (ref 6–20)
CO2: 26 mmol/L (ref 22–32)
Calcium: 8.9 mg/dL (ref 8.9–10.3)
Chloride: 104 mmol/L (ref 101–111)
Creatinine, Ser: 1.21 mg/dL (ref 0.61–1.24)
GFR calc Af Amer: >60 mL/min (ref >60)
GFR calc non Af Amer: >60 mL/min (ref >60)
Glucose, Bld: 94 mg/dL (ref 65–99)
Potassium: 4.4 mmol/L (ref 3.5–5.1)
Sodium: 139 mmol/L (ref 135–145)

## 2015-11-22 LAB — CBC
HCT: 43.4 % (ref 39.0–52.0)
Hemoglobin: 14.9 g/dL (ref 13.0–17.0)
MCH: 30.9 pg (ref 26.0–34.0)
MCHC: 34.3 g/dL (ref 30.0–36.0)
MCV: 90 fL (ref 78.0–100.0)
Platelets: 278 10*3/uL (ref 150–400)
RBC: 4.82 MIL/uL (ref 4.22–5.81)
RDW: 12.6 % (ref 11.5–15.5)
WBC: 4.8 10*3/uL (ref 4.0–10.5)

## 2015-11-26 ENCOUNTER — Encounter (HOSPITAL_COMMUNITY)
Admission: RE | Disposition: A | Discharge status: Home / Self Care | Payer: Self-pay | Source: Ambulatory Visit | Attending: Ophthalmology

## 2015-11-26 ENCOUNTER — Ambulatory Visit (HOSPITAL_COMMUNITY)
Admission: RE | Admit: 2015-11-26 | Discharge: 2015-11-26 | Disposition: A | Discharge status: Home / Self Care | Payer: Commercial Managed Care - HMO | Source: Ambulatory Visit | Attending: Ophthalmology | Admitting: Ophthalmology

## 2015-11-26 ENCOUNTER — Ambulatory Visit (HOSPITAL_COMMUNITY): Payer: Commercial Managed Care - HMO | Admitting: Anesthesiology

## 2015-11-26 DIAGNOSIS — H268 Other specified cataract: Secondary | ICD-10-CM | POA: Diagnosis not present

## 2015-11-26 DIAGNOSIS — K219 Gastro-esophageal reflux disease without esophagitis: Secondary | ICD-10-CM | POA: Diagnosis not present

## 2015-11-26 DIAGNOSIS — H25041 Posterior subcapsular polar age-related cataract, right eye: Secondary | ICD-10-CM | POA: Diagnosis present

## 2015-11-26 DIAGNOSIS — Z79899 Other long term (current) drug therapy: Secondary | ICD-10-CM | POA: Diagnosis not present

## 2015-11-26 DIAGNOSIS — E7800 Pure hypercholesterolemia, unspecified: Secondary | ICD-10-CM | POA: Insufficient documentation

## 2015-11-26 DIAGNOSIS — Z7982 Long term (current) use of aspirin: Secondary | ICD-10-CM | POA: Insufficient documentation

## 2015-11-26 DIAGNOSIS — I10 Essential (primary) hypertension: Secondary | ICD-10-CM | POA: Diagnosis not present

## 2015-11-26 HISTORY — PX: CATARACT EXTRACTION W/PHACO: SHX586

## 2015-11-26 SURGERY — PHACOEMULSIFICATION, CATARACT, WITH IOL INSERTION
Anesthesia: Monitor Anesthesia Care | Site: Eye | Laterality: Right

## 2015-11-26 MED ORDER — FENTANYL CITRATE (PF) 100 MCG/2ML IJ SOLN
25.0000 ug | INTRAMUSCULAR | Status: AC
  Administered 2015-11-26 (×2): 25 ug via INTRAVENOUS

## 2015-11-26 MED ORDER — EPINEPHRINE HCL 1 MG/ML IJ SOLN
INTRAOCULAR | Status: DC | PRN
  Administered 2015-11-26: 500 mL

## 2015-11-26 MED ORDER — CYCLOPENTOLATE-PHENYLEPHRINE 0.2-1 % OP SOLN
1.0000 [drp] | OPHTHALMIC | Status: AC
  Administered 2015-11-26 (×3): 1 [drp] via OPHTHALMIC

## 2015-11-26 MED ORDER — FENTANYL CITRATE (PF) 100 MCG/2ML IJ SOLN
INTRAMUSCULAR | Status: AC
  Filled 2015-11-26: qty 2

## 2015-11-26 MED ORDER — MIDAZOLAM HCL 2 MG/2ML IJ SOLN
1.0000 mg | INTRAMUSCULAR | Status: DC | PRN
  Administered 2015-11-26: 2 mg via INTRAVENOUS

## 2015-11-26 MED ORDER — TETRACAINE HCL 0.5 % OP SOLN
1.0000 [drp] | OPHTHALMIC | Status: AC
  Administered 2015-11-26 (×3): 1 [drp] via OPHTHALMIC

## 2015-11-26 MED ORDER — TETRACAINE 0.5 % OP SOLN OPTIME - NO CHARGE
OPHTHALMIC | Status: DC | PRN
  Administered 2015-11-26: 2 [drp] via OPHTHALMIC

## 2015-11-26 MED ORDER — LACTATED RINGERS IV SOLN
INTRAVENOUS | Status: DC
  Administered 2015-11-26: 1000 mL via INTRAVENOUS

## 2015-11-26 MED ORDER — EPINEPHRINE HCL 1 MG/ML IJ SOLN
INTRAMUSCULAR | Status: AC
  Filled 2015-11-26: qty 1

## 2015-11-26 MED ORDER — BSS IO SOLN
INTRAOCULAR | Status: DC | PRN
  Administered 2015-11-26: 15 mL

## 2015-11-26 MED ORDER — KETOROLAC TROMETHAMINE 0.5 % OP SOLN
1.0000 [drp] | OPHTHALMIC | Status: AC
  Administered 2015-11-26 (×3): 1 [drp] via OPHTHALMIC

## 2015-11-26 MED ORDER — PROVISC 10 MG/ML IO SOLN
INTRAOCULAR | Status: DC | PRN
  Administered 2015-11-26: 0.85 mL via INTRAOCULAR

## 2015-11-26 MED ORDER — BACITRACIN-NEOMYCIN-POLYMYXIN 400-5-5000 EX OINT
TOPICAL_OINTMENT | CUTANEOUS | Status: AC
  Filled 2015-11-26: qty 2

## 2015-11-26 MED ORDER — MIDAZOLAM HCL 2 MG/2ML IJ SOLN
INTRAMUSCULAR | Status: AC
  Filled 2015-11-26: qty 2

## 2015-11-26 MED ORDER — PHENYLEPHRINE HCL 2.5 % OP SOLN
1.0000 [drp] | OPHTHALMIC | Status: AC | PRN
  Administered 2015-11-26 (×3): 1 [drp] via OPHTHALMIC

## 2015-11-26 SURGICAL SUPPLY — 10 items

## 2015-11-26 NOTE — Anesthesia Preprocedure Evaluation (Signed)
Anesthesia Evaluation  Patient identified by MRN, date of birth, ID band Patient awake    Reviewed: Allergy & Precautions, H&P , NPO status , Patient's Chart, lab work & pertinent test results  Airway Mallampati: II  TM Distance: >3 FB Neck ROM: full    Dental no notable dental hx. (+) Teeth Intact, Dental Advisory Given   Pulmonary neg pulmonary ROS,    Pulmonary exam normal breath sounds clear to auscultation       Cardiovascular Exercise Tolerance: Good hypertension, Pt. on medications Normal cardiovascular exam Rhythm:regular Rate:Normal     Neuro/Psych negative psych ROS   GI/Hepatic negative GI ROS, GERD  Medicated and Controlled,  Endo/Other    Renal/GU      Musculoskeletal   Abdominal   Peds  Hematology   Anesthesia Other Findings   Reproductive/Obstetrics                             Anesthesia Physical Anesthesia Plan  ASA: II  Anesthesia Plan: MAC   Post-op Pain Management:    Induction: Intravenous  Airway Management Planned: Nasal Cannula  Additional Equipment:   Intra-op Plan:   Post-operative Plan:   Informed Consent: I have reviewed the patients History and Physical, chart, labs and discussed the procedure including the risks, benefits and alternatives for the proposed anesthesia with the patient or authorized representative who has indicated his/her understanding and acceptance.     Plan Discussed with:   Anesthesia Plan Comments:         Anesthesia Quick Evaluation

## 2015-11-26 NOTE — Progress Notes (Signed)
Neosporin applied to abrasion to right side of nose.

## 2015-11-26 NOTE — Transfer of Care (Signed)
Immediate Anesthesia Transfer of Care Note  Patient: [name]  Procedure(s) Performed: Procedure(s) with comments: CATARACT EXTRACTION PHACO AND INTRAOCULAR LENS PLACEMENT (IOC) (Right) - CDE:6.97  Patient Location: Short Stay  Anesthesia Type:MAC  Level of Consciousness: awake, alert , oriented and patient cooperative  Airway & Oxygen Therapy: Patient Spontanous Breathing  Post-op Assessment: Report given to RN, Post -op Vital signs reviewed and stable and Patient moving all extremities  Post vital signs: Reviewed and stable  Last Vitals:  Filed Vitals:   11/26/15 0900 11/26/15 0905  BP: 93/59   Pulse:    Temp:    Resp: 29 18    Complications: No apparent anesthesia complications

## 2015-11-26 NOTE — Discharge Instructions (Signed)
°  °          [facility] Instructions °[address]- [place] [address] °    ° °1. Avoid closing eyes tightly. One often closes the eye tightly when laughing, talking, sneezing, coughing or if they feel irritated. At these times, you should be careful not to close your eyes tightly. ° °2. Instill eye drops as instructed. To instill drops in your eye, open it, look up and have someone gently pull the lower lid down and instill a couple of drops inside the lower lid. ° °3. Do not touch upper lid. ° °4. Take Advil or Tylenol for pain. ° °5. You may use either eye for near work, such as reading or sewing and you may watch television. ° °6. You may have your hair done at the beauty parlor at any time. ° °7. Wear dark glasses with or without your own glasses if you are in bright light. ° °8. Call our office at [phone] or [phone] if you have sharp pain in your eye or unusual symptoms. ° °9.  FOLLOW UP WITH DR. SHAPIRO TODAY IN HIS [place] OFFICE AT 2:45pm. ° °  °I have received a copy of the above instructions and will follow them.  ° ° ° °IF YOU ARE IN IMMEDIATE DANGER CALL 911! ° °It is important for you to keep your follow-up appointment with your physician after discharge, OR, for you /your caregiver to make a follow-up appointment with your physician / medical provider after discharge. ° °Show these instructions to the next healthcare provider you see. ° °

## 2015-11-26 NOTE — Op Note (Signed)
Patient brought to the operating room and prepped and draped in the usual manner.  Lid speculum inserted in right eye.  Stab incision made at the twelve o'clock position.  Provisc instilled in the anterior chamber.   A 2.4 mm. Stab incision was made temporally.  An anterior capsulotomy was done with a bent 25 gauge needle.  The nucleus was hydrodissected.  The Phaco tip was inserted in the anterior chamber and the nucleus was emulsified.  CDE was 6.97.  The cortical material was then removed with the I and A tip.  Posterior capsule was the polished.  The anterior chamber was deepened with Provisc.  A 22.0 Diopter Alcon [id number] IOL was then inserted in the capsular bag.  Provisc was then removed with the I and A tip.  The wound was then hydrated.  Patient sent to the Recovery Room in good condition with follow up in my office.  Preoperative Diagnosis:  Nuclear and PSC Cataract OD Postoperative Diagnosis:  Same Procedure name: Kelman Phacoemulsification OD with IOL

## 2015-11-26 NOTE — Anesthesia Postprocedure Evaluation (Signed)
Anesthesia Post Note  Patient: [name]  Procedure(s) Performed: Procedure(s) (LRB): CATARACT EXTRACTION PHACO AND INTRAOCULAR LENS PLACEMENT (IOC) (Right)  Patient location during evaluation: Short Stay Anesthesia Type: MAC Level of consciousness: awake and alert, oriented and patient cooperative Pain management: pain level controlled Vital Signs Assessment: post-procedure vital signs reviewed and stable Respiratory status: nonlabored ventilation and respiratory function stable Cardiovascular status: blood pressure returned to baseline Postop Assessment: no signs of nausea or vomiting Anesthetic complications: no    Last Vitals:  Filed Vitals:   11/26/15 0900 11/26/15 0905  BP: 93/59   Pulse:    Temp:    Resp: 29 18    Last Pain: There were no vitals filed for this visit.               Nasif Bos J

## 2015-11-26 NOTE — H&P (Signed)
The patient was re examined and there is no change in the patients condition since the original H and P. 

## 2015-11-27 ENCOUNTER — Encounter (HOSPITAL_COMMUNITY): Payer: Self-pay | Admitting: Ophthalmology

## 2015-12-04 ENCOUNTER — Encounter (HOSPITAL_COMMUNITY)
Admission: RE | Admit: 2015-12-04 | Discharge: 2015-12-04 | Disposition: A | Discharge status: Home / Self Care | Payer: Commercial Managed Care - HMO | Source: Ambulatory Visit | Attending: Ophthalmology | Admitting: Ophthalmology

## 2015-12-04 ENCOUNTER — Encounter (HOSPITAL_COMMUNITY): Payer: Self-pay

## 2015-12-10 ENCOUNTER — Encounter (HOSPITAL_COMMUNITY): Payer: Self-pay | Admitting: *Deleted

## 2015-12-10 ENCOUNTER — Encounter (HOSPITAL_COMMUNITY)
Admission: RE | Disposition: A | Discharge status: Home / Self Care | Payer: Self-pay | Source: Ambulatory Visit | Attending: Ophthalmology

## 2015-12-10 ENCOUNTER — Ambulatory Visit (HOSPITAL_COMMUNITY): Payer: Commercial Managed Care - HMO | Admitting: Anesthesiology

## 2015-12-10 ENCOUNTER — Ambulatory Visit (HOSPITAL_COMMUNITY)
Admission: RE | Admit: 2015-12-10 | Discharge: 2015-12-10 | Disposition: A | Discharge status: Home / Self Care | Payer: Commercial Managed Care - HMO | Source: Ambulatory Visit | Attending: Ophthalmology | Admitting: Ophthalmology

## 2015-12-10 DIAGNOSIS — Z7982 Long term (current) use of aspirin: Secondary | ICD-10-CM | POA: Insufficient documentation

## 2015-12-10 DIAGNOSIS — I10 Essential (primary) hypertension: Secondary | ICD-10-CM | POA: Insufficient documentation

## 2015-12-10 DIAGNOSIS — E7800 Pure hypercholesterolemia, unspecified: Secondary | ICD-10-CM | POA: Diagnosis not present

## 2015-12-10 DIAGNOSIS — Z79899 Other long term (current) drug therapy: Secondary | ICD-10-CM | POA: Insufficient documentation

## 2015-12-10 DIAGNOSIS — H25032 Anterior subcapsular polar age-related cataract, left eye: Secondary | ICD-10-CM | POA: Diagnosis not present

## 2015-12-10 DIAGNOSIS — H268 Other specified cataract: Secondary | ICD-10-CM | POA: Diagnosis not present

## 2015-12-10 DIAGNOSIS — K219 Gastro-esophageal reflux disease without esophagitis: Secondary | ICD-10-CM | POA: Insufficient documentation

## 2015-12-10 HISTORY — PX: CATARACT EXTRACTION W/PHACO: SHX586

## 2015-12-10 SURGERY — PHACOEMULSIFICATION, CATARACT, WITH IOL INSERTION
Anesthesia: Monitor Anesthesia Care | Site: Eye | Laterality: Left

## 2015-12-10 MED ORDER — KETOROLAC TROMETHAMINE 0.5 % OP SOLN
1.0000 [drp] | OPHTHALMIC | Status: AC
  Administered 2015-12-10 (×3): 1 [drp] via OPHTHALMIC

## 2015-12-10 MED ORDER — CYCLOPENTOLATE-PHENYLEPHRINE 0.2-1 % OP SOLN
1.0000 [drp] | OPHTHALMIC | Status: AC
  Administered 2015-12-10 (×3): 1 [drp] via OPHTHALMIC

## 2015-12-10 MED ORDER — PROVISC 10 MG/ML IO SOLN
INTRAOCULAR | Status: DC | PRN
  Administered 2015-12-10: 0.85 mL via INTRAOCULAR

## 2015-12-10 MED ORDER — TETRACAINE 0.5 % OP SOLN OPTIME - NO CHARGE
OPHTHALMIC | Status: DC | PRN
  Administered 2015-12-10: 2 [drp] via OPHTHALMIC

## 2015-12-10 MED ORDER — EPINEPHRINE HCL 1 MG/ML IJ SOLN
INTRAOCULAR | Status: DC | PRN
  Administered 2015-12-10: 500 mL

## 2015-12-10 MED ORDER — BSS IO SOLN
INTRAOCULAR | Status: DC | PRN
  Administered 2015-12-10: 15 mL

## 2015-12-10 MED ORDER — MIDAZOLAM HCL 2 MG/2ML IJ SOLN
1.0000 mg | INTRAMUSCULAR | Status: DC | PRN
  Administered 2015-12-10 (×2): 2 mg via INTRAVENOUS
  Filled 2015-12-10: qty 2

## 2015-12-10 MED ORDER — PHENYLEPHRINE HCL 2.5 % OP SOLN
1.0000 [drp] | OPHTHALMIC | Status: AC
  Administered 2015-12-10 (×3): 1 [drp] via OPHTHALMIC

## 2015-12-10 MED ORDER — TETRACAINE HCL 0.5 % OP SOLN
1.0000 [drp] | OPHTHALMIC | Status: AC
  Administered 2015-12-10 (×3): 1 [drp] via OPHTHALMIC

## 2015-12-10 MED ORDER — MIDAZOLAM HCL 2 MG/2ML IJ SOLN
INTRAMUSCULAR | Status: AC
  Filled 2015-12-10: qty 2

## 2015-12-10 MED ORDER — EPINEPHRINE HCL 1 MG/ML IJ SOLN
INTRAMUSCULAR | Status: AC
  Filled 2015-12-10: qty 1

## 2015-12-10 MED ORDER — LACTATED RINGERS IV SOLN
INTRAVENOUS | Status: DC
  Administered 2015-12-10: 08:00:00 via INTRAVENOUS

## 2015-12-10 SURGICAL SUPPLY — 9 items
CLOTH BEACON ORANGE TIMEOUT ST (SAFETY) ×1 IMPLANT
EYE SHIELD UNIVERSAL CLEAR (GAUZE/BANDAGES/DRESSINGS) ×1 IMPLANT
GLOVE BIO SURGEON STRL SZ 6.5 (GLOVE) ×1 IMPLANT
GLOVE EXAM NITRILE MD LF STRL (GLOVE) ×1 IMPLANT
LENS ALC ACRYL/TECN (Ophthalmic Related) ×2 IMPLANT
PAD ARMBOARD 7.5X6 YLW CONV (MISCELLANEOUS) ×1 IMPLANT
TAPE SURG TRANSPORE 1 IN (GAUZE/BANDAGES/DRESSINGS) IMPLANT
TAPE SURGICAL TRANSPORE 1 IN (GAUZE/BANDAGES/DRESSINGS) ×1
WATER STERILE IRR 250ML POUR (IV SOLUTION) ×1 IMPLANT

## 2015-12-10 NOTE — Op Note (Signed)
Patient brought to the operating room and prepped and draped in the usual manner.  Lid speculum inserted in left eye.  Stab incision made at the twelve o'clock position.  Provisc instilled in the anterior chamber.   A 2.4 mm. Stab incision was made temporally.  An anterior capsulotomy was done with a bent 25 gauge needle.  The nucleus was hydrodissected.  The Phaco tip was inserted in the anterior chamber and the nucleus was emulsified.  CDE was 4.25.  The cortical material was then removed with the I and A tip.  Posterior capsule was the polished.  The anterior chamber was deepened with Provisc.  A 22.5 Diopter Alcon [id number] IOL was then inserted in the capsular bag.  Provisc was then removed with the I and A tip.  The wound was then hydrated.  Patient sent to the Recovery Room in good condition with follow up in my office.  Preoperative Diagnosis:  Nuclear and PSC Cataract OS Postoperative Diagnosis:  Same Procedure name: Kelman Phacoemulsification OS with IOL

## 2015-12-10 NOTE — H&P (Signed)
The patient was re examined and there is no change in the patients condition since the original H and P. 

## 2015-12-10 NOTE — Anesthesia Preprocedure Evaluation (Signed)
Anesthesia Evaluation  Patient identified by MRN, date of birth, ID band Patient awake    Reviewed: Allergy & Precautions, NPO status , Patient's Chart, lab work & pertinent test results  Airway Mallampati: II  TM Distance: >3 FB     Dental   Pulmonary    Pulmonary exam normal        Cardiovascular hypertension, Pt. on medications Normal cardiovascular exam     Neuro/Psych    GI/Hepatic GERD  Medicated and Poorly Controlled,  Endo/Other    Renal/GU      Musculoskeletal   Abdominal Normal abdominal exam  (+)   Peds  Hematology  (+) anemia ,   Anesthesia Other Findings   Reproductive/Obstetrics                             Anesthesia Physical Anesthesia Plan  ASA: III  Anesthesia Plan: MAC   Post-op Pain Management:    Induction: Intravenous  Airway Management Planned: Nasal Cannula  Additional Equipment:   Intra-op Plan:   Post-operative Plan:   Informed Consent: I have reviewed the patients History and Physical, chart, labs and discussed the procedure including the risks, benefits and alternatives for the proposed anesthesia with the patient or authorized representative who has indicated his/her understanding and acceptance.   Dental advisory given  Plan Discussed with: CRNA  Anesthesia Plan Comments:         Anesthesia Quick Evaluation

## 2015-12-10 NOTE — Transfer of Care (Signed)
Immediate Anesthesia Transfer of Care Note  Patient: [name]  Procedure(s) Performed: Procedure(s) with comments: CATARACT EXTRACTION PHACO AND INTRAOCULAR LENS PLACEMENT (IOC) (Left) - CDE:4.25  Patient Location: Short Stay  Anesthesia Type:MAC  Level of Consciousness: awake  Airway & Oxygen Therapy: Patient Spontanous Breathing  Post-op Assessment: Report given to RN  Post vital signs: Reviewed  Last Vitals:  Filed Vitals:   12/10/15 0759  BP: 106/73  Pulse: 60  Temp: 36.6 C  Resp: 16    Complications: No apparent anesthesia complications

## 2015-12-10 NOTE — Anesthesia Postprocedure Evaluation (Signed)
Anesthesia Post Note  Patient: Ferry Hottle  Procedure(s) Performed: Procedure(s) (LRB): CATARACT EXTRACTION PHACO AND INTRAOCULAR LENS PLACEMENT (IOC) (Left)  Patient location during evaluation: Short Stay Anesthesia Type: MAC Level of consciousness: awake and alert Pain management: pain level controlled Vital Signs Assessment: post-procedure vital signs reviewed and stable Respiratory status: spontaneous breathing Cardiovascular status: stable Postop Assessment: no signs of nausea or vomiting Anesthetic complications: no    Last Vitals:  Filed Vitals:   12/10/15 0759  BP: 106/73  Pulse: 60  Temp: 36.6 C  Resp: 16    Last Pain: There were no vitals filed for this visit.               Santa Abdelrahman

## 2015-12-10 NOTE — Discharge Instructions (Signed)
°  °          [facility] Instructions °[address]- [place] [address] °    ° °1. Avoid closing eyes tightly. One often closes the eye tightly when laughing, talking, sneezing, coughing or if they feel irritated. At these times, you should be careful not to close your eyes tightly. ° °2. Instill eye drops as instructed. To instill drops in your eye, open it, look up and have someone gently pull the lower lid down and instill a couple of drops inside the lower lid. ° °3. Do not touch upper lid. ° °4. Take Advil or Tylenol for pain. ° °5. You may use either eye for near work, such as reading or sewing and you may watch television. ° °6. You may have your hair done at the beauty parlor at any time. ° °7. Wear dark glasses with or without your own glasses if you are in bright light. ° °8. Call our office at [phone] or [phone] if you have sharp pain in your eye or unusual symptoms. ° °9.  FOLLOW UP WITH DR. SHAPIRO TODAY IN HIS [place] OFFICE AT 2:45pm. ° °  °I have received a copy of the above instructions and will follow them.  ° ° ° °IF YOU ARE IN IMMEDIATE DANGER CALL 911! ° °It is important for you to keep your follow-up appointment with your physician after discharge, OR, for you /your caregiver to make a follow-up appointment with your physician / medical provider after discharge. ° °Show these instructions to the next healthcare provider you see. ° °

## 2015-12-11 ENCOUNTER — Encounter (HOSPITAL_COMMUNITY): Payer: Self-pay | Admitting: Ophthalmology

## 2016-03-06 ENCOUNTER — Encounter (HOSPITAL_COMMUNITY): Payer: Self-pay

## 2016-03-06 ENCOUNTER — Emergency Department (HOSPITAL_COMMUNITY)
Admission: EM | Admit: 2016-03-06 | Discharge: 2016-03-06 | Disposition: A | Discharge status: Home / Self Care | Payer: Commercial Managed Care - HMO | Attending: Emergency Medicine | Admitting: Emergency Medicine

## 2016-03-06 DIAGNOSIS — Y939 Activity, unspecified: Secondary | ICD-10-CM | POA: Diagnosis not present

## 2016-03-06 DIAGNOSIS — Z79899 Other long term (current) drug therapy: Secondary | ICD-10-CM | POA: Insufficient documentation

## 2016-03-06 DIAGNOSIS — S50862A Insect bite (nonvenomous) of left forearm, initial encounter: Secondary | ICD-10-CM | POA: Diagnosis not present

## 2016-03-06 DIAGNOSIS — T63481A Toxic effect of venom of other arthropod, accidental (unintentional), initial encounter: Secondary | ICD-10-CM

## 2016-03-06 DIAGNOSIS — Z7982 Long term (current) use of aspirin: Secondary | ICD-10-CM | POA: Insufficient documentation

## 2016-03-06 DIAGNOSIS — E785 Hyperlipidemia, unspecified: Secondary | ICD-10-CM | POA: Diagnosis not present

## 2016-03-06 DIAGNOSIS — S40862A Insect bite (nonvenomous) of left upper arm, initial encounter: Secondary | ICD-10-CM | POA: Diagnosis not present

## 2016-03-06 DIAGNOSIS — I10 Essential (primary) hypertension: Secondary | ICD-10-CM | POA: Insufficient documentation

## 2016-03-06 DIAGNOSIS — F17290 Nicotine dependence, other tobacco product, uncomplicated: Secondary | ICD-10-CM | POA: Diagnosis not present

## 2016-03-06 DIAGNOSIS — Y999 Unspecified external cause status: Secondary | ICD-10-CM | POA: Diagnosis not present

## 2016-03-06 DIAGNOSIS — Y929 Unspecified place or not applicable: Secondary | ICD-10-CM | POA: Insufficient documentation

## 2016-03-06 DIAGNOSIS — S40861A Insect bite (nonvenomous) of right upper arm, initial encounter: Secondary | ICD-10-CM | POA: Diagnosis not present

## 2016-03-06 DIAGNOSIS — W57XXXA Bitten or stung by nonvenomous insect and other nonvenomous arthropods, initial encounter: Secondary | ICD-10-CM | POA: Diagnosis not present

## 2016-03-06 HISTORY — DX: Hyperlipidemia, unspecified: E78.5

## 2016-03-06 MED ORDER — HYDROXYZINE HCL 25 MG PO TABS
ORAL_TABLET | ORAL | Status: AC
  Administered 2016-03-06: 25 mg
  Filled 2016-03-06: qty 1

## 2016-03-06 MED ORDER — SODIUM CHLORIDE 0.9 % IV SOLN
INTRAVENOUS | Status: DC
  Administered 2016-03-06: 04:00:00 via INTRAVENOUS

## 2016-03-06 MED ORDER — PREDNISONE 20 MG PO TABS: ORAL_TABLET | ORAL | Status: DC

## 2016-03-06 MED ORDER — FAMOTIDINE 20 MG PO TABS: 20.0000 mg | ORAL_TABLET | Freq: Two times a day (BID) | ORAL | Status: DC

## 2016-03-06 MED ORDER — FAMOTIDINE IN NACL 20-0.9 MG/50ML-% IV SOLN
20.0000 mg | Freq: Once | INTRAVENOUS | Status: AC
  Administered 2016-03-06: 20 mg via INTRAVENOUS
  Filled 2016-03-06: qty 50

## 2016-03-06 MED ORDER — HYDROXYZINE HCL 25 MG PO TABS
50.0000 mg | ORAL_TABLET | Freq: Once | ORAL | Status: AC
  Administered 2016-03-06: 50 mg via ORAL
  Filled 2016-03-06: qty 2

## 2016-03-06 MED ORDER — DIPHENHYDRAMINE HCL 50 MG/ML IJ SOLN
25.0000 mg | Freq: Once | INTRAMUSCULAR | Status: AC
  Administered 2016-03-06: 25 mg via INTRAVENOUS
  Filled 2016-03-06: qty 1

## 2016-03-06 MED ORDER — METHYLPREDNISOLONE SODIUM SUCC 125 MG IJ SOLR
125.0000 mg | Freq: Once | INTRAMUSCULAR | Status: AC
  Administered 2016-03-06: 125 mg via INTRAVENOUS
  Filled 2016-03-06: qty 2

## 2016-03-06 MED ORDER — HYDROXYZINE HCL 25 MG PO TABS: ORAL_TABLET | ORAL | Status: DC

## 2016-03-06 NOTE — ED Notes (Signed)
Patient screamed out while placing iv to lt upper ac area.  After fluids began infusing pt states that  i used the wrong vein.  He pointed to a vein in his lt ac and rt a/c area and said they always stick me here! That's why it hurt.  Asked pt if he would like iv removed and replaced in different location, pt states no.

## 2016-03-06 NOTE — ED Notes (Signed)
Pt states he was stung by yellow jackets x 8 at approx 11 am yesterday, states he has been taking benadryl but is still itching.

## 2016-03-06 NOTE — Discharge Instructions (Signed)
Use ice packs or baking soda paste on the bite sites that are itching. Take the prednisone and pepcid until gone. Use the atarax for itching not controlled by benadryl.   Recheck if you get swelling in your throat, have difficulty breathing or swallowing, or the bite sites get more swollen, drain pus, you see red streaks.

## 2016-03-06 NOTE — ED Provider Notes (Signed)
CSN: [id number]     Arrival date & time 03/06/16  0325 History   First MD Initiated Contact with Patient 03/06/16 0348 AM   Chief Complaint  Patient presents with  . Insect Bite     (Consider location/radiation/quality/duration/timing/severity/associated sxs/prior Treatment) HPI patient states yesterday afternoon around 11 AM he was weed eating and he got into a yellow jacket nest. He reports multiple stings. He states about 8 PM this evening however he started having itching. He denies any difficulty swallowing or breathing. He states the same thing happens whenever he gets a bee sting like this. The last time was last year. He has tried Benadryl at home without improvement of the itching.   PCP Dr Armen Pickup   Past Medical History  Diagnosis Date  . Hypertension   . GERD (gastroesophageal reflux disease)     RARE   . Anemia     COUPLE OF YRS AGO- NO PROBLEMS SINCE  . Pain     RIGHT SHOULDER - ROTATOR CUFF TEAR  . Complication of anesthesia     REMEMBERS [place] UP  . Hyperlipidemia    Past Surgical History  Procedure Laterality Date  . Back surgery  2006    LOWER BACK FUSION - SCREWS AND RODS  . Fingernail removed rt index finger - for cyst that was behind the nail    . Tonsillectomy      SURGERY AS A CHILD  . Shoulder open rotator cuff repair Right 09/01/2013    Procedure: RIGHT SHOULDER MINI OPEN ROTATOR CUFF REPAIR WITH SUBACROMIAL DECOMPRESSION;  Surgeon: [name], MD;  Location: [facility];  Service: Orthopedics;  Laterality: Right;  . Cholecystectomy    . Cataract extraction w/phaco Right 11/26/2015    Procedure: CATARACT EXTRACTION PHACO AND INTRAOCULAR LENS PLACEMENT (IOC);  Surgeon: [name], MD;  Location: [facility];  Service: Ophthalmology;  Laterality: Right;  CDE:6.97  . Cataract extraction w/phaco Left 12/10/2015    Procedure: CATARACT EXTRACTION PHACO AND INTRAOCULAR LENS PLACEMENT (IOC);  Surgeon: [name], MD;  Location: [facility];   Service: Ophthalmology;  Laterality: Left;  CDE:4.25   Family History  Problem Relation Age of Onset  . Hypertension Mother   . Kidney failure Mother   . Diabetes Mother    Social History  Substance Use Topics  . Smoking status: Never Smoker   . Smokeless tobacco: Current User    Types: Snuff  . Alcohol Use: No  Retired  Review of Systems  All other systems reviewed and are negative.     Allergies  Bee venom and Codeine  Home Medications   Prior to Admission medications   Medication Sig Start Date End Date Taking? Authorizing Provider  aspirin EC 81 MG tablet Take 81 mg by mouth daily.   Yes Historical Provider, MD  atorvastatin (LIPITOR) 20 MG tablet Take 20 mg by mouth daily with breakfast.    Yes Historical Provider, MD  Esomeprazole Magnesium (NEXIUM PO) Take by mouth. PT NOT SURE OF DOSE - TAKES ONE EVERY OTHER DAY IN MORNING   Yes Historical Provider, MD  losartan-hydrochlorothiazide (HYZAAR) 100-25 MG tablet Take 1 tablet by mouth daily. 10/17/15  Yes Historical Provider, MD  Omega-3 Fatty Acids (FISH OIL PO) Take 1 capsule by mouth daily.   Yes Historical Provider, MD  amoxicillin (AMOXIL) 875 MG tablet Take 875 mg by mouth 2 (two) times daily. Starting 11/14/2015 x 10 days for sinus infection. 11/14/15   Historical Provider, MD  famotidine (PEPCID) 20 MG tablet Take 1 tablet (20 mg total) by mouth 2 (two) times daily. 03/06/16   Rolland Porter, MD  hydrOXYzine (ATARAX/VISTARIL) 25 MG tablet Take 1 or 2 po Q 6hrs for itching or swelling 03/06/16   Rolland Porter, MD  predniSONE (DELTASONE) 20 MG tablet Take 3 po QD x 3d , then 2 po QD x 3d then 1 po QD x 3d 03/06/16   Rolland Porter, MD   BP 120/82 mmHg  Pulse 65  Temp(Src) 97.8 F (36.6 C) (Oral)  Resp 16  Ht 5\' 9"  (1.753 m)  Wt 203 lb (92.08 kg)  BMI 29.96 kg/m2  SpO2 95%  Vital signs normal   Physical Exam  Constitutional: He is oriented to person, place, and time. He appears well-developed and well-nourished.  Non-toxic  appearance. He does not appear ill. No distress.  HENT:  Head: Normocephalic and atraumatic.  Right Ear: External ear normal.  Left Ear: External ear normal.  Nose: Nose normal. No mucosal edema or rhinorrhea.  Mouth/Throat: Oropharynx is clear and moist and mucous membranes are normal. No dental abscesses or uvula swelling.  Eyes: Conjunctivae and EOM are normal. Pupils are equal, round, and reactive to light.  Neck: Normal range of motion and full passive range of motion without pain. Neck supple.  Cardiovascular: Normal rate, regular rhythm and normal heart sounds.  Exam reveals no gallop and no friction rub.   No murmur heard. Pulmonary/Chest: Effort normal and breath sounds normal. No respiratory distress. He has no wheezes. He has no rhonchi. He has no rales. He exhibits no tenderness and no crepitus.  Abdominal: Soft. Normal appearance and bowel sounds are normal. He exhibits no distension. There is no tenderness. There is no rebound and no guarding.  Musculoskeletal: Normal range of motion. He exhibits no edema or tenderness.  Moves all extremities well.   Neurological: He is alert and oriented to person, place, and time. He has normal strength. No cranial nerve deficit.  Skin: Skin is warm, dry and intact. No rash noted. No erythema. No pallor.  Patient is noted to have localized swelling approximately 4 cm in size on his dorsum of his right hand near the base of the thumb, his right upper arm, his left upper arm, his left forearm, and a very small area about a half a centimeter in size on his chest. He did have a lesion in his back however it appears to be gone now.  Psychiatric: He has a normal mood and affect. His speech is normal and behavior is normal. His mood appears not anxious.  Nursing note and vitals reviewed.   ED Course  Procedures (including critical care time)  Medications  0.9 %  sodium chloride infusion ( Intravenous New Bag/Given 03/06/16 0421)  diphenhydrAMINE  (BENADRYL) injection 25 mg (25 mg Intravenous Given 03/06/16 0421)  famotidine (PEPCID) IVPB 20 mg premix (20 mg Intravenous New Bag/Given 03/06/16 0421)  methylPREDNISolone sodium succinate (SOLU-MEDROL) 125 mg/2 mL injection 125 mg (125 mg Intravenous Given 03/06/16 0421)  hydrOXYzine (ATARAX/VISTARIL) tablet 50 mg (50 mg Oral Given 03/06/16 0524)  hydrOXYzine (ATARAX/VISTARIL) 25 MG tablet (25 mg  Given 03/06/16 0534)    Patient was given IV Benadryl, Pepcid, and Solu-Medrol for his localized allergic reactions to the bee stings.  5 AM patient states he still is having itching. Atarax was added to his regimen. He also was given ice packs to place on the sites that are itching the worst.  Recheck at 6:10  AM patient is sleeping. He is easily awakened. He states the itching is almost gone. He feels ready to be discharged home. We discussed using baking soda paste on the bite sites when he gets home in addition to the medications he will be prescribed to take home, Pepcid, prednisone, and the hydroxyzine.  MDM   Final diagnoses:  Local reaction to insect sting, accidental or unintentional, initial encounter   New Prescriptions   FAMOTIDINE (PEPCID) 20 MG TABLET    Take 1 tablet (20 mg total) by mouth 2 (two) times daily.   HYDROXYZINE (ATARAX/VISTARIL) 25 MG TABLET    Take 1 or 2 po Q 6hrs for itching or swelling   PREDNISONE (DELTASONE) 20 MG TABLET    Take 3 po QD x 3d , then 2 po QD x 3d then 1 po QD x 3d    Plan discharge  Rolland Porter, MD, Barbette Or, MD 03/06/16 [id number]

## 2016-09-24 DIAGNOSIS — Z125 Encounter for screening for malignant neoplasm of prostate: Secondary | ICD-10-CM | POA: Diagnosis not present

## 2016-09-24 DIAGNOSIS — Z0000 Encounter for general adult medical examination without abnormal findings: Secondary | ICD-10-CM | POA: Diagnosis not present

## 2016-09-24 DIAGNOSIS — Z23 Encounter for immunization: Secondary | ICD-10-CM | POA: Diagnosis not present

## 2016-09-24 DIAGNOSIS — N183 Chronic kidney disease, stage 3 (moderate): Secondary | ICD-10-CM | POA: Diagnosis not present

## 2016-10-01 DIAGNOSIS — M5416 Radiculopathy, lumbar region: Secondary | ICD-10-CM | POA: Diagnosis not present

## 2016-10-21 DIAGNOSIS — M5416 Radiculopathy, lumbar region: Secondary | ICD-10-CM | POA: Diagnosis not present

## 2016-11-05 DIAGNOSIS — M5416 Radiculopathy, lumbar region: Secondary | ICD-10-CM | POA: Diagnosis not present

## 2016-11-05 DIAGNOSIS — M25511 Pain in right shoulder: Secondary | ICD-10-CM | POA: Diagnosis not present

## 2016-11-24 DIAGNOSIS — M5416 Radiculopathy, lumbar region: Secondary | ICD-10-CM | POA: Diagnosis not present

## 2017-01-06 DIAGNOSIS — M5416 Radiculopathy, lumbar region: Secondary | ICD-10-CM | POA: Diagnosis not present

## 2017-01-19 DIAGNOSIS — I10 Essential (primary) hypertension: Secondary | ICD-10-CM | POA: Diagnosis not present

## 2017-01-19 DIAGNOSIS — E782 Mixed hyperlipidemia: Secondary | ICD-10-CM | POA: Diagnosis not present

## 2017-01-19 DIAGNOSIS — K219 Gastro-esophageal reflux disease without esophagitis: Secondary | ICD-10-CM | POA: Diagnosis not present

## 2017-01-19 DIAGNOSIS — M545 Low back pain: Secondary | ICD-10-CM | POA: Diagnosis not present

## 2017-01-20 DIAGNOSIS — M5032 Other cervical disc degeneration, mid-cervical region, unspecified level: Secondary | ICD-10-CM | POA: Diagnosis not present

## 2017-01-20 DIAGNOSIS — M5416 Radiculopathy, lumbar region: Secondary | ICD-10-CM | POA: Diagnosis not present

## 2017-01-20 DIAGNOSIS — Z01812 Encounter for preprocedural laboratory examination: Secondary | ICD-10-CM | POA: Diagnosis not present

## 2017-01-27 DIAGNOSIS — M5416 Radiculopathy, lumbar region: Secondary | ICD-10-CM | POA: Diagnosis not present

## 2017-02-02 DIAGNOSIS — M961 Postlaminectomy syndrome, not elsewhere classified: Secondary | ICD-10-CM | POA: Diagnosis not present

## 2017-02-02 DIAGNOSIS — M5416 Radiculopathy, lumbar region: Secondary | ICD-10-CM | POA: Diagnosis not present

## 2017-02-15 DIAGNOSIS — Z1159 Encounter for screening for other viral diseases: Secondary | ICD-10-CM | POA: Diagnosis not present

## 2017-02-15 DIAGNOSIS — I10 Essential (primary) hypertension: Secondary | ICD-10-CM | POA: Diagnosis not present

## 2017-02-16 DIAGNOSIS — M961 Postlaminectomy syndrome, not elsewhere classified: Secondary | ICD-10-CM | POA: Diagnosis not present

## 2017-02-16 DIAGNOSIS — M545 Low back pain: Secondary | ICD-10-CM | POA: Diagnosis not present

## 2017-03-02 DIAGNOSIS — K219 Gastro-esophageal reflux disease without esophagitis: Secondary | ICD-10-CM | POA: Diagnosis not present

## 2017-03-02 DIAGNOSIS — F411 Generalized anxiety disorder: Secondary | ICD-10-CM | POA: Diagnosis not present

## 2017-03-02 DIAGNOSIS — Z6831 Body mass index (BMI) 31.0-31.9, adult: Secondary | ICD-10-CM | POA: Diagnosis not present

## 2017-03-02 DIAGNOSIS — E782 Mixed hyperlipidemia: Secondary | ICD-10-CM | POA: Diagnosis not present

## 2017-03-02 DIAGNOSIS — Z0000 Encounter for general adult medical examination without abnormal findings: Secondary | ICD-10-CM | POA: Diagnosis not present

## 2017-03-02 DIAGNOSIS — M545 Low back pain: Secondary | ICD-10-CM | POA: Diagnosis not present

## 2017-03-02 DIAGNOSIS — I10 Essential (primary) hypertension: Secondary | ICD-10-CM | POA: Diagnosis not present

## 2017-03-06 DIAGNOSIS — M961 Postlaminectomy syndrome, not elsewhere classified: Secondary | ICD-10-CM | POA: Diagnosis not present

## 2017-03-06 DIAGNOSIS — M5416 Radiculopathy, lumbar region: Secondary | ICD-10-CM | POA: Diagnosis not present

## 2017-03-17 DIAGNOSIS — H04123 Dry eye syndrome of bilateral lacrimal glands: Secondary | ICD-10-CM | POA: Diagnosis not present

## 2017-04-14 DIAGNOSIS — M549 Dorsalgia, unspecified: Secondary | ICD-10-CM | POA: Diagnosis not present

## 2017-04-14 DIAGNOSIS — M545 Low back pain: Secondary | ICD-10-CM | POA: Diagnosis not present

## 2017-04-14 DIAGNOSIS — M4326 Fusion of spine, lumbar region: Secondary | ICD-10-CM | POA: Diagnosis not present

## 2017-04-21 DIAGNOSIS — M48061 Spinal stenosis, lumbar region without neurogenic claudication: Secondary | ICD-10-CM | POA: Diagnosis not present

## 2017-04-21 DIAGNOSIS — M4326 Fusion of spine, lumbar region: Secondary | ICD-10-CM | POA: Diagnosis not present

## 2017-04-21 DIAGNOSIS — M545 Low back pain: Secondary | ICD-10-CM | POA: Diagnosis not present

## 2017-05-18 DIAGNOSIS — Z961 Presence of intraocular lens: Secondary | ICD-10-CM | POA: Diagnosis not present

## 2017-05-18 DIAGNOSIS — H524 Presbyopia: Secondary | ICD-10-CM | POA: Diagnosis not present

## 2017-05-25 ENCOUNTER — Telehealth: Payer: Self-pay

## 2017-05-25 NOTE — Telephone Encounter (Signed)
Pt said that his PCP (Dr Nevada Crane) was sending a referral for him to have a colonoscopy. He is wanting to have it done before his back surgery. He said by the end of Oct or early Nov. Please call him at [phone]

## 2017-05-31 DIAGNOSIS — Z1283 Encounter for screening for malignant neoplasm of skin: Secondary | ICD-10-CM | POA: Diagnosis not present

## 2017-05-31 DIAGNOSIS — D225 Melanocytic nevi of trunk: Secondary | ICD-10-CM | POA: Diagnosis not present

## 2017-05-31 DIAGNOSIS — Z23 Encounter for immunization: Secondary | ICD-10-CM | POA: Diagnosis not present

## 2017-05-31 DIAGNOSIS — D485 Neoplasm of uncertain behavior of skin: Secondary | ICD-10-CM | POA: Diagnosis not present

## 2017-05-31 NOTE — Telephone Encounter (Signed)
Pt has not planned his back surgery yet.  He has constipation and hard stools when he does have a BM, does not have one every day. OV with Walden Field, NP on 07/14/2017 at 8:30 AM prior to scheduling colonoscopy.

## 2017-06-08 DIAGNOSIS — H26491 Other secondary cataract, right eye: Secondary | ICD-10-CM | POA: Diagnosis not present

## 2017-06-08 DIAGNOSIS — Z961 Presence of intraocular lens: Secondary | ICD-10-CM | POA: Diagnosis not present

## 2017-06-08 DIAGNOSIS — H35033 Hypertensive retinopathy, bilateral: Secondary | ICD-10-CM | POA: Diagnosis not present

## 2017-06-08 DIAGNOSIS — H04123 Dry eye syndrome of bilateral lacrimal glands: Secondary | ICD-10-CM | POA: Diagnosis not present

## 2017-06-17 HISTORY — PX: BACK SURGERY: SHX140

## 2017-06-25 DIAGNOSIS — M545 Low back pain: Secondary | ICD-10-CM | POA: Diagnosis not present

## 2017-06-25 DIAGNOSIS — M961 Postlaminectomy syndrome, not elsewhere classified: Secondary | ICD-10-CM | POA: Diagnosis not present

## 2017-06-25 DIAGNOSIS — M4716 Other spondylosis with myelopathy, lumbar region: Secondary | ICD-10-CM | POA: Diagnosis not present

## 2017-06-25 DIAGNOSIS — M48061 Spinal stenosis, lumbar region without neurogenic claudication: Secondary | ICD-10-CM | POA: Diagnosis not present

## 2017-07-05 DIAGNOSIS — Z01818 Encounter for other preprocedural examination: Secondary | ICD-10-CM | POA: Diagnosis not present

## 2017-07-05 DIAGNOSIS — K219 Gastro-esophageal reflux disease without esophagitis: Secondary | ICD-10-CM | POA: Diagnosis not present

## 2017-07-05 DIAGNOSIS — Z6831 Body mass index (BMI) 31.0-31.9, adult: Secondary | ICD-10-CM | POA: Diagnosis not present

## 2017-07-06 DIAGNOSIS — Z01818 Encounter for other preprocedural examination: Secondary | ICD-10-CM | POA: Diagnosis not present

## 2017-07-06 DIAGNOSIS — R001 Bradycardia, unspecified: Secondary | ICD-10-CM | POA: Diagnosis not present

## 2017-07-06 DIAGNOSIS — M545 Low back pain: Secondary | ICD-10-CM | POA: Diagnosis not present

## 2017-07-10 ENCOUNTER — Emergency Department (HOSPITAL_COMMUNITY)
Admission: EM | Admit: 2017-07-10 | Discharge: 2017-07-10 | Disposition: A | Discharge status: Home / Self Care | Payer: PPO | Attending: Emergency Medicine | Admitting: Emergency Medicine

## 2017-07-10 ENCOUNTER — Emergency Department (HOSPITAL_COMMUNITY): Payer: PPO

## 2017-07-10 ENCOUNTER — Encounter (HOSPITAL_COMMUNITY): Payer: Self-pay | Admitting: Emergency Medicine

## 2017-07-10 ENCOUNTER — Other Ambulatory Visit: Payer: Self-pay

## 2017-07-10 DIAGNOSIS — I10 Essential (primary) hypertension: Secondary | ICD-10-CM | POA: Diagnosis not present

## 2017-07-10 DIAGNOSIS — M25561 Pain in right knee: Secondary | ICD-10-CM | POA: Diagnosis present

## 2017-07-10 DIAGNOSIS — M25461 Effusion, right knee: Secondary | ICD-10-CM | POA: Insufficient documentation

## 2017-07-10 DIAGNOSIS — M1711 Unilateral primary osteoarthritis, right knee: Secondary | ICD-10-CM | POA: Diagnosis not present

## 2017-07-10 DIAGNOSIS — Z7982 Long term (current) use of aspirin: Secondary | ICD-10-CM | POA: Diagnosis not present

## 2017-07-10 DIAGNOSIS — Z79899 Other long term (current) drug therapy: Secondary | ICD-10-CM | POA: Diagnosis not present

## 2017-07-10 DIAGNOSIS — M179 Osteoarthritis of knee, unspecified: Secondary | ICD-10-CM | POA: Diagnosis not present

## 2017-07-10 NOTE — ED Triage Notes (Signed)
Rt knee pain and swelling x 5 days

## 2017-07-10 NOTE — Discharge Instructions (Signed)
Minimize bending your right knee as this movement will cause increased fluid build up.  Elevate and ice as you are doing.  You can take your hydrocodone if needed for pain relief as this medicine will not interfere with Mondays surgery.  Do not take any aspirin, ibuprofen, naproxen or any other anti - inflammatories before your surgery.

## 2017-07-12 DIAGNOSIS — Z9049 Acquired absence of other specified parts of digestive tract: Secondary | ICD-10-CM | POA: Diagnosis not present

## 2017-07-12 DIAGNOSIS — M4716 Other spondylosis with myelopathy, lumbar region: Secondary | ICD-10-CM | POA: Diagnosis not present

## 2017-07-12 DIAGNOSIS — M5106 Intervertebral disc disorders with myelopathy, lumbar region: Secondary | ICD-10-CM | POA: Diagnosis not present

## 2017-07-12 DIAGNOSIS — N183 Chronic kidney disease, stage 3 (moderate): Secondary | ICD-10-CM | POA: Diagnosis not present

## 2017-07-12 DIAGNOSIS — M532X6 Spinal instabilities, lumbar region: Secondary | ICD-10-CM | POA: Diagnosis not present

## 2017-07-12 DIAGNOSIS — I129 Hypertensive chronic kidney disease with stage 1 through stage 4 chronic kidney disease, or unspecified chronic kidney disease: Secondary | ICD-10-CM | POA: Diagnosis not present

## 2017-07-12 DIAGNOSIS — N400 Enlarged prostate without lower urinary tract symptoms: Secondary | ICD-10-CM | POA: Diagnosis not present

## 2017-07-12 DIAGNOSIS — K219 Gastro-esophageal reflux disease without esophagitis: Secondary | ICD-10-CM | POA: Diagnosis not present

## 2017-07-12 DIAGNOSIS — M961 Postlaminectomy syndrome, not elsewhere classified: Secondary | ICD-10-CM | POA: Diagnosis not present

## 2017-07-12 DIAGNOSIS — M4326 Fusion of spine, lumbar region: Secondary | ICD-10-CM | POA: Diagnosis not present

## 2017-07-12 DIAGNOSIS — Z7982 Long term (current) use of aspirin: Secondary | ICD-10-CM | POA: Diagnosis not present

## 2017-07-12 DIAGNOSIS — E782 Mixed hyperlipidemia: Secondary | ICD-10-CM | POA: Diagnosis not present

## 2017-07-12 DIAGNOSIS — M1711 Unilateral primary osteoarthritis, right knee: Secondary | ICD-10-CM | POA: Diagnosis not present

## 2017-07-12 NOTE — Patient Outreach (Signed)
Outreach patient after ED visit on 07/10/17 at [facility].  I spoke with patient briefly and he was in the Hospital at [facility].  Patient verified PCP and was just getting out of surgery.  I told him I would mail information about [facility] services and to give [place] a call once he was better.  Unsuccessful letter, magnet and know before you go mailed on today.

## 2017-07-12 NOTE — ED Provider Notes (Signed)
[facility] EMERGENCY DEPARTMENT Provider Note   CSN: [id number] Arrival date & time: 07/10/17  1111     History   Chief Complaint Chief Complaint  Patient presents with  . Knee Pain    HPI [name] is a 66 y.o. male with a history of HTN, GERD, hyperlipidemia and multiple orthopedic surgeries and anticipating a low back surgery in 2 days by Dr. Patrice Paradise, presenting with right knee pain and swelling. He denies any specific injury but reports he has been working harder around the house and yard this week doing tasks he knows he won't be able to during his recovery from surgery. He is concerned his knee pain will prevent him form having his back surgery. He has not contacted his surgeon with this concern. He denies redness, warmth, radiation of pain. His pain is constant with movement and weight bearing and better at rest. He has had no treatment for the knee prior to arrival.  The history is provided by the patient.    Past Medical History:  Diagnosis Date  . Anemia    COUPLE OF YRS AGO- NO PROBLEMS SINCE  . Complication of anesthesia    REMEMBERS [place] UP  . GERD (gastroesophageal reflux disease)    RARE   . Hyperlipidemia   . Hypertension   . Pain    RIGHT SHOULDER - ROTATOR CUFF TEAR    Patient Active Problem List   Diagnosis Date Noted  . Right rotator cuff tear 09/01/2013  . Rotator cuff tear, right 09/01/2013    Past Surgical History:  Procedure Laterality Date  . BACK SURGERY  2006   LOWER BACK FUSION - SCREWS AND RODS  . CATARACT EXTRACTION W/PHACO Right 11/26/2015   Procedure: CATARACT EXTRACTION PHACO AND INTRAOCULAR LENS PLACEMENT (IOC);  Surgeon: [name], MD;  Location: [facility];  Service: Ophthalmology;  Laterality: Right;  CDE:6.97  . CATARACT EXTRACTION W/PHACO Left 12/10/2015   Procedure: CATARACT EXTRACTION PHACO AND INTRAOCULAR LENS PLACEMENT (IOC);  Surgeon: [name], MD;  Location: [facility];  Service: Ophthalmology;   Laterality: Left;  CDE:4.25  . CHOLECYSTECTOMY    . FINGERNAIL REMOVED RT INDEX FINGER - FOR CYST THAT WAS BEHIND THE NAIL    . SHOULDER OPEN ROTATOR CUFF REPAIR Right 09/01/2013   Procedure: RIGHT SHOULDER MINI OPEN ROTATOR CUFF REPAIR WITH SUBACROMIAL DECOMPRESSION;  Surgeon: [name], MD;  Location: [facility];  Service: Orthopedics;  Laterality: Right;  . TONSILLECTOMY     SURGERY AS A CHILD       Home Medications    Prior to Admission medications   Medication Sig Start Date End Date Taking? Authorizing Provider  amoxicillin (AMOXIL) 875 MG tablet Take 875 mg by mouth 2 (two) times daily. Starting 11/14/2015 x 10 days for sinus infection. 11/14/15   [provider]  aspirin EC 81 MG tablet Take 81 mg by mouth daily.    [provider]  atorvastatin (LIPITOR) 20 MG tablet Take 20 mg by mouth daily with breakfast.     [provider]  Esomeprazole Magnesium (NEXIUM PO) Take by mouth. PT NOT SURE OF DOSE - TAKES ONE EVERY OTHER DAY IN MORNING    [provider]  famotidine (PEPCID) 20 MG tablet Take 1 tablet (20 mg total) by mouth 2 (two) times daily. 03/06/16   Rolland Porter, MD  hydrOXYzine (ATARAX/VISTARIL) 25 MG tablet Take 1 or 2 po Q 6hrs for itching or swelling 03/06/16   Rolland Porter, MD  losartan-hydrochlorothiazide (HYZAAR) 100-25 MG tablet Take 1 tablet by mouth daily. 10/17/15   [provider]  Omega-3 Fatty Acids (FISH OIL PO) Take 1 capsule by mouth daily.    [provider]  predniSONE (DELTASONE) 20 MG tablet Take 3 po QD x 3d , then 2 po QD x 3d then 1 po QD x 3d 03/06/16   Rolland Porter, MD    Family History Family History  Problem Relation Age of Onset  . Hypertension Mother   . Kidney failure Mother   . Diabetes Mother     Social History Social History   Tobacco Use  . Smoking status: Never Smoker  . Smokeless tobacco: Current User    Types: Snuff  Substance Use Topics  . Alcohol use: No  . Drug use: No      Allergies   Bee venom and Codeine   Review of Systems Review of Systems  Constitutional: Negative for fever.  Musculoskeletal: Positive for arthralgias and joint swelling. Negative for myalgias.  Neurological: Negative for weakness and numbness.     Physical Exam Updated Vital Signs BP (!) 144/95 (BP Location: Right Arm)   Pulse 77   Temp 98.3 F (36.8 C) (Oral)   Resp 19   Ht 5\' 9"  (1.753 m)   Wt 96.6 kg (213 lb)   SpO2 98%   BMI 31.45 kg/m   Physical Exam  Constitutional: He appears well-developed and well-nourished.  HENT:  Head: Atraumatic.  Neck: Normal range of motion.  Cardiovascular:  Pulses equal bilaterally  Musculoskeletal: He exhibits tenderness.       Right knee: He exhibits effusion. He exhibits no ecchymosis, no deformity, no erythema, normal alignment, no LCL laxity, normal meniscus and no MCL laxity. Tenderness found. Medial joint line tenderness noted. No patellar tendon tenderness noted.  Mild effusion appreciated.  Neurological: He is alert. He has normal strength. He displays normal reflexes. No sensory deficit.  Skin: Skin is warm and dry.  Psychiatric: He has a normal mood and affect.     ED Treatments / Results  Labs (all labs ordered are listed, but only abnormal results are displayed) Labs Reviewed - No data to display  EKG  EKG Interpretation None       Radiology  Dg Knee Complete 4 Views Right  Result Date: 07/10/2017 CLINICAL DATA:  66 y/o  M; anterior to lateral knee pain. EXAM: RIGHT KNEE - COMPLETE 4+ VIEW COMPARISON:  None. FINDINGS: No acute fracture or dislocation. Suprapatellar joint effusion. Moderate medial and mild lateral femorotibial compartment joint space narrowing. Tricompartmental osteophytosis. IMPRESSION: 1. No acute fracture or dislocation. 2. Suprapatellar joint effusion. 3. Tricompartmental osteoarthrosis greatest in medial femorotibial compartment. Electronically Signed   By: Kristine Garbe  M.D.   On: 07/10/2017 13:02     Procedures Procedures (including critical care time)  Medications Ordered in ED Medications - No data to display   Initial Impression / Assessment and Plan / ED Course  I have reviewed the triage vital signs and the nursing notes.  Pertinent labs & imaging results that were available during my care of the patient were reviewed by me and considered in my medical decision making (see chart for details).     Pt with osteoarthritis right knee with increased pain/swelling after increasing activity. No acute findings suggesting septic joint. Skin intact, no risk factors for infection. Advised resting the joint, RICE, he has crutches at home for prn use as needed. Hydrocodone recommended (pt states has), avoid nsaids,  asa given upcoming surgery. He sees Dr Tonita Cong for general ortho care, advised recheck with him prn if sx persist.  Final Clinical Impressions(s) / ED Diagnoses   Final diagnoses:  Effusion of right knee  Osteoarthritis of right knee, unspecified osteoarthritis type    ED Discharge Orders    None       Landis Martins 07/12/17 2117    Carmin Muskrat, MD 07/14/17 1635

## 2017-07-14 ENCOUNTER — Ambulatory Visit: Payer: Commercial Managed Care - HMO | Admitting: Nurse Practitioner

## 2017-07-15 DIAGNOSIS — Z87891 Personal history of nicotine dependence: Secondary | ICD-10-CM | POA: Diagnosis not present

## 2017-07-15 DIAGNOSIS — Z981 Arthrodesis status: Secondary | ICD-10-CM | POA: Diagnosis not present

## 2017-07-15 DIAGNOSIS — M1711 Unilateral primary osteoarthritis, right knee: Secondary | ICD-10-CM | POA: Diagnosis not present

## 2017-07-15 DIAGNOSIS — Z79891 Long term (current) use of opiate analgesic: Secondary | ICD-10-CM | POA: Diagnosis not present

## 2017-07-15 DIAGNOSIS — Z7982 Long term (current) use of aspirin: Secondary | ICD-10-CM | POA: Diagnosis not present

## 2017-07-15 DIAGNOSIS — Z4789 Encounter for other orthopedic aftercare: Secondary | ICD-10-CM | POA: Diagnosis not present

## 2017-07-19 ENCOUNTER — Other Ambulatory Visit: Payer: Self-pay

## 2017-07-19 DIAGNOSIS — Z79891 Long term (current) use of opiate analgesic: Secondary | ICD-10-CM | POA: Diagnosis not present

## 2017-07-19 DIAGNOSIS — Z981 Arthrodesis status: Secondary | ICD-10-CM | POA: Diagnosis not present

## 2017-07-19 DIAGNOSIS — Z87891 Personal history of nicotine dependence: Secondary | ICD-10-CM | POA: Diagnosis not present

## 2017-07-19 DIAGNOSIS — Z7982 Long term (current) use of aspirin: Secondary | ICD-10-CM | POA: Diagnosis not present

## 2017-07-19 DIAGNOSIS — Z4789 Encounter for other orthopedic aftercare: Secondary | ICD-10-CM | POA: Diagnosis not present

## 2017-07-19 DIAGNOSIS — M1711 Unilateral primary osteoarthritis, right knee: Secondary | ICD-10-CM | POA: Diagnosis not present

## 2017-07-19 NOTE — Patient Outreach (Signed)
[facility]) Care Management  07/19/2017  [name] Aug 07, 1951 [id number]     Transition of Care Referral  Referral Date: 07/19/17 Referral Source: [facility] Discharge Report Date of Admission: unknown Diagnosis: intervertebral disc disorder with myelopathy Date of Discharge: 07/12/17 Facility: [facility] Insurance: [facility]    Outreach attempt # 1 to patient. Spoke with patient. He voices he is doing fairly well since return home. He states that he has supportive spouse and family who is helping him out. [facility] was out earlier today to see him. He goes to surgeon f/u appt on 08/11/17. He states that he saw PCP prior to hospitalization and he was aware of patient having surgery done. RN CM educated patient on importance of PCP f/u after discharge from hospital by at least contacting office to alert them that he is home. Patient voices he does not feel like that it necessary and declined. He states that his pain is controlled. He was taking Oyxcocone  But was  having bad itching spells. He called surgeon office today and was he was told to stop med and start taking Gabapentin 3x/day. He voices no further issues or concerns regarding meds. He denies any RN CM needs or concerns at this time. He was appreciative of f/u call.     Plan: RN CM will notify [facility] administrative assistant of case status.    Enzo Montgomery, RN,BSN,CCM [place] Management Telephonic Care Management Coordinator Direct Phone: [phone] Toll Free: [phone] Fax: [phone]

## 2017-07-28 DIAGNOSIS — M545 Low back pain: Secondary | ICD-10-CM | POA: Diagnosis not present

## 2017-08-04 DIAGNOSIS — M1711 Unilateral primary osteoarthritis, right knee: Secondary | ICD-10-CM | POA: Diagnosis not present

## 2017-08-04 DIAGNOSIS — Z87891 Personal history of nicotine dependence: Secondary | ICD-10-CM | POA: Diagnosis not present

## 2017-08-04 DIAGNOSIS — Z4789 Encounter for other orthopedic aftercare: Secondary | ICD-10-CM | POA: Diagnosis not present

## 2017-08-11 HISTORY — PX: LUMBAR SPINE SURGERY: SHX701

## 2017-09-03 DIAGNOSIS — I10 Essential (primary) hypertension: Secondary | ICD-10-CM | POA: Diagnosis not present

## 2017-09-03 DIAGNOSIS — E782 Mixed hyperlipidemia: Secondary | ICD-10-CM | POA: Diagnosis not present

## 2017-09-03 DIAGNOSIS — Z125 Encounter for screening for malignant neoplasm of prostate: Secondary | ICD-10-CM | POA: Diagnosis not present

## 2017-09-06 DIAGNOSIS — I10 Essential (primary) hypertension: Secondary | ICD-10-CM | POA: Diagnosis not present

## 2017-09-06 DIAGNOSIS — H93A9 Pulsatile tinnitus, unspecified ear: Secondary | ICD-10-CM | POA: Diagnosis not present

## 2017-09-06 DIAGNOSIS — K219 Gastro-esophageal reflux disease without esophagitis: Secondary | ICD-10-CM | POA: Diagnosis not present

## 2017-09-06 DIAGNOSIS — M5136 Other intervertebral disc degeneration, lumbar region: Secondary | ICD-10-CM | POA: Diagnosis not present

## 2017-09-06 DIAGNOSIS — Z6832 Body mass index (BMI) 32.0-32.9, adult: Secondary | ICD-10-CM | POA: Diagnosis not present

## 2017-09-06 DIAGNOSIS — M545 Low back pain: Secondary | ICD-10-CM | POA: Diagnosis not present

## 2017-09-06 DIAGNOSIS — E782 Mixed hyperlipidemia: Secondary | ICD-10-CM | POA: Diagnosis not present

## 2017-09-29 DIAGNOSIS — M4716 Other spondylosis with myelopathy, lumbar region: Secondary | ICD-10-CM | POA: Diagnosis not present

## 2017-09-29 DIAGNOSIS — M4326 Fusion of spine, lumbar region: Secondary | ICD-10-CM | POA: Diagnosis not present

## 2017-09-29 DIAGNOSIS — M961 Postlaminectomy syndrome, not elsewhere classified: Secondary | ICD-10-CM | POA: Diagnosis not present

## 2017-09-29 DIAGNOSIS — M545 Low back pain: Secondary | ICD-10-CM | POA: Diagnosis not present

## 2017-11-02 ENCOUNTER — Ambulatory Visit: Payer: PPO | Admitting: Nurse Practitioner

## 2017-11-02 ENCOUNTER — Other Ambulatory Visit: Payer: Self-pay

## 2017-11-02 ENCOUNTER — Encounter: Payer: Self-pay | Admitting: Nurse Practitioner

## 2017-11-02 ENCOUNTER — Telehealth: Payer: Self-pay

## 2017-11-02 VITALS — BP 122/81 | HR 59 | Temp 97.0°F | Ht 69.0 in | Wt 213.4 lb

## 2017-11-02 DIAGNOSIS — K5900 Constipation, unspecified: Secondary | ICD-10-CM | POA: Diagnosis not present

## 2017-11-02 DIAGNOSIS — Z1211 Encounter for screening for malignant neoplasm of colon: Secondary | ICD-10-CM

## 2017-11-02 DIAGNOSIS — K649 Unspecified hemorrhoids: Secondary | ICD-10-CM

## 2017-11-02 DIAGNOSIS — R69 Illness, unspecified: Secondary | ICD-10-CM | POA: Diagnosis not present

## 2017-11-02 MED ORDER — PEG 3350-KCL-NA BICARB-NACL 420 G PO SOLR: 4000.0000 mL | ORAL | 0 refills | Status: DC

## 2017-11-02 NOTE — Progress Notes (Signed)
Primary Care Physician:  [name], MD Primary Gastroenterologist:  Dr. Gala Romney  Chief Complaint  Patient presents with  . Colonoscopy    consult  . Constipation    has to take metamucil to have a BM    HPI:   [name] is a 67 y.o. male who presents for constipation and to schedule a colonoscopy.  The patient is not been seen in our office before.  He was referred by his primary care doctor.  Colonoscopy was deferred to office visit due to constipation.  His last colonoscopy in our system appears to have been 10/02/1999 by Dr. Delfin Edis.  Findings included minimal diverticulosis of the left colon and no evidence for recurrent polyps.  Recommended 10-year repeat exam and high-fiber diet.  He does have a history of hyperplastic polyps.  Today states he's doing well. His last colonoscopy was at [facility] in [place]. He states he is about 6 months overdue for a colonoscopy. Is havign constipation and takes Metamucil. He has had constipation since cholecystectomy. If he doesn't take Metamucil at least every other day he will not have a bowel movement. He does have regular stools if he takes metamucil, still with some hard stools and straining. Causes hemorrhoid flares when he's constipated. Denies hematochezia, melena, fever, chills, unintentional weight loss. Denies abdominal pain, N/V. Denies chest pain, dyspnea, dizziness, lightheadedness, syncope, near syncope. Denies any other upper or lower GI symptoms.  Past Medical History:  Diagnosis Date  . Anemia    COUPLE OF YRS AGO- NO PROBLEMS SINCE  . Complication of anesthesia    REMEMBERS [place] [facility]  . GERD (gastroesophageal reflux disease)    RARE   . Hyperlipidemia   . Hypertension   . Pain    RIGHT SHOULDER - ROTATOR CUFF TEAR    Past Surgical History:  Procedure Laterality Date  . BACK SURGERY  2006   LOWER BACK FUSION - SCREWS AND RODS  . CATARACT EXTRACTION W/PHACO Right 11/26/2015   Procedure:  CATARACT EXTRACTION PHACO AND INTRAOCULAR LENS PLACEMENT (IOC);  Surgeon: [name], MD;  Location: [facility];  Service: Ophthalmology;  Laterality: Right;  CDE:6.97  . CATARACT EXTRACTION W/PHACO Left 12/10/2015   Procedure: CATARACT EXTRACTION PHACO AND INTRAOCULAR LENS PLACEMENT (IOC);  Surgeon: [name], MD;  Location: [facility];  Service: Ophthalmology;  Laterality: Left;  CDE:4.25  . CHOLECYSTECTOMY    . FINGERNAIL REMOVED RT INDEX FINGER - FOR CYST THAT WAS BEHIND THE NAIL    . SHOULDER OPEN ROTATOR CUFF REPAIR Right 09/01/2013   Procedure: RIGHT SHOULDER MINI OPEN ROTATOR CUFF REPAIR WITH SUBACROMIAL DECOMPRESSION;  Surgeon: [name], MD;  Location: [facility];  Service: Orthopedics;  Laterality: Right;  . TONSILLECTOMY     SURGERY AS A CHILD    Current Outpatient Medications  Medication Sig Dispense Refill  . aspirin EC 81 MG tablet Take 81 mg by mouth daily.    Marland Kitchen atorvastatin (LIPITOR) 20 MG tablet Take 20 mg by mouth daily with breakfast.     . gabapentin (NEURONTIN) 300 MG capsule Take 300 mg by mouth as needed.    Marland Kitchen HYDROcodone-acetaminophen (NORCO/VICODIN) 5-325 MG tablet Take 0.5 tablets by mouth every 6 (six) hours as needed for moderate pain.    Marland Kitchen losartan-hydrochlorothiazide (HYZAAR) 100-25 MG tablet Take 1 tablet by mouth daily.  1  . Omega-3 Fatty Acids (FISH OIL PO) Take 1 capsule by mouth daily.    . pantoprazole (PROTONIX) 40 MG  tablet Take 40 mg by mouth daily.     No current facility-administered medications for this visit.     Allergies as of 11/02/2017 - Review Complete 11/02/2017  Allergen Reaction Noted  . Bee venom Itching 03/06/2016  . Codeine Itching 04/13/2013    Family History  Problem Relation Age of Onset  . Hypertension Mother   . Kidney failure Mother   . Diabetes Mother   . Colon cancer Neg Hx     Social History   Socioeconomic History  . Marital status: Married    Spouse name: Not on file  . Number of children: Not on file  . Years  of education: Not on file  . Highest education level: Not on file  Social Needs  . Financial resource strain: Not on file  . Food insecurity - worry: Not on file  . Food insecurity - inability: Not on file  . Transportation needs - medical: Not on file  . Transportation needs - non-medical: Not on file  Occupational History  . Not on file  Tobacco Use  . Smoking status: Never Smoker  . Smokeless tobacco: Current User    Types: Snuff  Substance and Sexual Activity  . Alcohol use: No  . Drug use: No  . Sexual activity: Not on file  Other Topics Concern  . Not on file  Social History Narrative  . Not on file    Review of Systems: General: Negative for anorexia, weight loss, fever, chills, fatigue, weakness. ENT: Negative for hoarseness, difficulty swallowing , nasal congestion. CV: Negative for chest pain, angina, palpitations, dyspnea on exertion, peripheral edema.  Respiratory: Negative for dyspnea at rest, dyspnea on exertion, cough, sputum, wheezing.  GI: See history of present illness. MS: Admits chronic pain.  Derm: Negative for rash or itching.   Endo: Negative for unusual weight change.  Heme: Negative for bruising or bleeding. Allergy: Negative for rash or hives.    Physical Exam: BP 122/81   Pulse (!) 59   Temp (!) 97 F (36.1 C) (Oral)   Ht 5\' 9"  (1.753 m)   Wt 213 lb 6.4 oz (96.8 kg)   BMI 31.51 kg/m  General:   Alert and oriented. Pleasant and cooperative. Well-nourished and well-developed.  Head:  Normocephalic and atraumatic. Eyes:  Without icterus, sclera clear and conjunctiva pink.  Ears:  Normal auditory acuity. Cardiovascular:  S1, S2 present without murmurs appreciated. Extremities without clubbing or edema. Respiratory:  Clear to auscultation bilaterally. No wheezes, rales, or rhonchi. No distress.  Gastrointestinal:  +BS, soft, non-tender and non-distended. No HSM noted. No guarding or rebound. No masses appreciated.  Rectal:  Deferred    Musculoskalatal:  Symmetrical without gross deformities. Neurologic:  Alert and oriented x4;  grossly normal neurologically. Psych:  Alert and cooperative. Normal mood and affect. Heme/Lymph/Immune: No excessive bruising noted.    11/02/2017 8:59 AM   Disclaimer: This note was dictated with voice recognition software. Similar sounding words can inadvertently be transcribed and may not be corrected upon review.

## 2017-11-02 NOTE — Assessment & Plan Note (Signed)
Patient has constipation.  He uses Metamucil as needed.  When he has not had a bowel movement in 2-3 days and stools are hard and causing hemorrhoid flares, then he will take a dose of Metamucil.  At this point we will try to get him on a better, regular bowel regimen.  I will have him start Colace 100 mg once a day.  He can still use Metamucil as needed but hopefully he will need it less.  No red flag/warning signs or symptoms.  Return for follow-up in 3 months.

## 2017-11-02 NOTE — H&P (View-Only) (Signed)
Primary Care Physician:  [name], MD Primary Gastroenterologist:  Dr. Gala Romney  Chief Complaint  Patient presents with  . Colonoscopy    consult  . Constipation    has to take metamucil to have a BM    HPI:   [name] is a 67 y.o. male who presents for constipation and to schedule a colonoscopy.  The patient is not been seen in our office before.  He was referred by his primary care doctor.  Colonoscopy was deferred to office visit due to constipation.  His last colonoscopy in our system appears to have been 10/02/1999 by Dr. Delfin Edis.  Findings included minimal diverticulosis of the left colon and no evidence for recurrent polyps.  Recommended 10-year repeat exam and high-fiber diet.  He does have a history of hyperplastic polyps.  Today states he's doing well. His last colonoscopy was at [facility] in [place]. He states he is about 6 months overdue for a colonoscopy. Is havign constipation and takes Metamucil. He has had constipation since cholecystectomy. If he doesn't take Metamucil at least every other day he will not have a bowel movement. He does have regular stools if he takes metamucil, still with some hard stools and straining. Causes hemorrhoid flares when he's constipated. Denies hematochezia, melena, fever, chills, unintentional weight loss. Denies abdominal pain, N/V. Denies chest pain, dyspnea, dizziness, lightheadedness, syncope, near syncope. Denies any other upper or lower GI symptoms.  Past Medical History:  Diagnosis Date  . Anemia    COUPLE OF YRS AGO- NO PROBLEMS SINCE  . Complication of anesthesia    REMEMBERS [place] [facility]  . GERD (gastroesophageal reflux disease)    RARE   . Hyperlipidemia   . Hypertension   . Pain    RIGHT SHOULDER - ROTATOR CUFF TEAR    Past Surgical History:  Procedure Laterality Date  . BACK SURGERY  2006   LOWER BACK FUSION - SCREWS AND RODS  . CATARACT EXTRACTION W/PHACO Right 11/26/2015   Procedure:  CATARACT EXTRACTION PHACO AND INTRAOCULAR LENS PLACEMENT (IOC);  Surgeon: [name], MD;  Location: [facility];  Service: Ophthalmology;  Laterality: Right;  CDE:6.97  . CATARACT EXTRACTION W/PHACO Left 12/10/2015   Procedure: CATARACT EXTRACTION PHACO AND INTRAOCULAR LENS PLACEMENT (IOC);  Surgeon: [name], MD;  Location: [facility];  Service: Ophthalmology;  Laterality: Left;  CDE:4.25  . CHOLECYSTECTOMY    . FINGERNAIL REMOVED RT INDEX FINGER - FOR CYST THAT WAS BEHIND THE NAIL    . SHOULDER OPEN ROTATOR CUFF REPAIR Right 09/01/2013   Procedure: RIGHT SHOULDER MINI OPEN ROTATOR CUFF REPAIR WITH SUBACROMIAL DECOMPRESSION;  Surgeon: [name], MD;  Location: [facility];  Service: Orthopedics;  Laterality: Right;  . TONSILLECTOMY     SURGERY AS A CHILD    Current Outpatient Medications  Medication Sig Dispense Refill  . aspirin EC 81 MG tablet Take 81 mg by mouth daily.    Marland Kitchen atorvastatin (LIPITOR) 20 MG tablet Take 20 mg by mouth daily with breakfast.     . gabapentin (NEURONTIN) 300 MG capsule Take 300 mg by mouth as needed.    Marland Kitchen HYDROcodone-acetaminophen (NORCO/VICODIN) 5-325 MG tablet Take 0.5 tablets by mouth every 6 (six) hours as needed for moderate pain.    Marland Kitchen losartan-hydrochlorothiazide (HYZAAR) 100-25 MG tablet Take 1 tablet by mouth daily.  1  . Omega-3 Fatty Acids (FISH OIL PO) Take 1 capsule by mouth daily.    . pantoprazole (PROTONIX) 40 MG  tablet Take 40 mg by mouth daily.     No current facility-administered medications for this visit.     Allergies as of 11/02/2017 - Review Complete 11/02/2017  Allergen Reaction Noted  . Bee venom Itching 03/06/2016  . Codeine Itching 04/13/2013    Family History  Problem Relation Age of Onset  . Hypertension Mother   . Kidney failure Mother   . Diabetes Mother   . Colon cancer Neg Hx     Social History   Socioeconomic History  . Marital status: Married    Spouse name: Not on file  . Number of children: Not on file  . Years  of education: Not on file  . Highest education level: Not on file  Social Needs  . Financial resource strain: Not on file  . Food insecurity - worry: Not on file  . Food insecurity - inability: Not on file  . Transportation needs - medical: Not on file  . Transportation needs - non-medical: Not on file  Occupational History  . Not on file  Tobacco Use  . Smoking status: Never Smoker  . Smokeless tobacco: Current User    Types: Snuff  Substance and Sexual Activity  . Alcohol use: No  . Drug use: No  . Sexual activity: Not on file  Other Topics Concern  . Not on file  Social History Narrative  . Not on file    Review of Systems: General: Negative for anorexia, weight loss, fever, chills, fatigue, weakness. ENT: Negative for hoarseness, difficulty swallowing , nasal congestion. CV: Negative for chest pain, angina, palpitations, dyspnea on exertion, peripheral edema.  Respiratory: Negative for dyspnea at rest, dyspnea on exertion, cough, sputum, wheezing.  GI: See history of present illness. MS: Admits chronic pain.  Derm: Negative for rash or itching.   Endo: Negative for unusual weight change.  Heme: Negative for bruising or bleeding. Allergy: Negative for rash or hives.    Physical Exam: BP 122/81   Pulse (!) 59   Temp (!) 97 F (36.1 C) (Oral)   Ht 5\' 9"  (1.753 m)   Wt 213 lb 6.4 oz (96.8 kg)   BMI 31.51 kg/m  General:   Alert and oriented. Pleasant and cooperative. Well-nourished and well-developed.  Head:  Normocephalic and atraumatic. Eyes:  Without icterus, sclera clear and conjunctiva pink.  Ears:  Normal auditory acuity. Cardiovascular:  S1, S2 present without murmurs appreciated. Extremities without clubbing or edema. Respiratory:  Clear to auscultation bilaterally. No wheezes, rales, or rhonchi. No distress.  Gastrointestinal:  +BS, soft, non-tender and non-distended. No HSM noted. No guarding or rebound. No masses appreciated.  Rectal:  Deferred    Musculoskalatal:  Symmetrical without gross deformities. Neurologic:  Alert and oriented x4;  grossly normal neurologically. Psych:  Alert and cooperative. Normal mood and affect. Heme/Lymph/Immune: No excessive bruising noted.    11/02/2017 8:59 AM   Disclaimer: This note was dictated with voice recognition software. Similar sounding words can inadvertently be transcribed and may not be corrected upon review.

## 2017-11-02 NOTE — Assessment & Plan Note (Signed)
He is currently due for colonoscopy.  His last one was completed about 10 years ago [facility] gastroenterology in [place].  We will request this report.  The patient is on chronic pain medications.  We will plan for propofol/MAC to promote adequate sedation.  He does have a history of hyperplastic polyps per his last currently available colonoscopy.  We will proceed at this time for surveillance/screening.  Proceed with TCS on propofol/MAC with Dr. Gala Romney in near future: the risks, benefits, and alternatives have been discussed with the patient in detail. The patient states understanding and desires to proceed.  The patient is currently on Neurontin and hydrocodone.  No other anticoagulants, anxiolytics, chronic pain medications, or antidepressants.  He denies alcohol and drug use.  We will plan for the procedure on propofol/MAC to promote adequate sedation.

## 2017-11-02 NOTE — Progress Notes (Signed)
CC'D TO PCP °

## 2017-11-02 NOTE — Patient Instructions (Signed)
1. Heart taking Colace stool softener 100 mg once a day, every day. 2. Use Metamucil as needed. 3. If this does not help you have better bowel movements which are easier to pass and with less frequent constipation, call us and let us know. 4. We will request your previous colonoscopy from [facility] gastroenterology. 5. We will schedule your procedure for you. 6. Follow-up in 3 months. 7. Call us if you have any questions or concerns.   Have a Great Summer!    At [facility] we value your feedback. You may receive a survey about your visit today. Please share your experience as we strive to create trusing relationships with our patients to provide genuine, compassionate, quality care.

## 2017-11-02 NOTE — Telephone Encounter (Signed)
Called and informed pt of pre-op appt 11/26/17 at 10:00am. Letter mailed

## 2017-11-25 NOTE — Patient Instructions (Signed)
[name]  11/25/2017     @PREFPERIOPPHARMACY @   Your procedure is scheduled on   12/02/2017   Report to [facility] at  645   A.M.  Call this number if you have problems the morning of surgery:  [phone]   Remember:  Do not eat food or drink liquids after midnight.  Take these medicines the morning of surgery with A SIP OF WATER  Losartan, protonix.   Do not wear jewelry, make-up or nail polish.  Do not wear lotions, powders, or perfumes, or deodorant.  Do not shave 48 hours prior to surgery.  Men may shave face and neck.  Do not bring valuables to the hospital.  [facility] is not responsible for any belongings or valuables.  Contacts, dentures or bridgework may not be worn into surgery.  Leave your suitcase in the car.  After surgery it may be brought to your room.  For patients admitted to the hospital, discharge time will be determined by your treatment team.  Patients discharged the day of surgery will not be allowed to drive home.   Name and phone number of your driver:   family Special instructions:  Follow the diet and prep instructions given to you by Dr Roseanne Kaufman office.  Please read over the following fact sheets that you were given. Anesthesia Post-op Instructions and Care and Recovery After Surgery       Colonoscopy, Adult A colonoscopy is an exam to look at the large intestine. It is done to check for problems, such as:  Lumps (tumors).  Growths (polyps).  Swelling (inflammation).  Bleeding.  What happens before the procedure? Eating and drinking Follow instructions from your doctor about eating and drinking. These instructions may include:  A few days before the procedure - follow a low-fiber diet. ? Avoid nuts. ? Avoid seeds. ? Avoid dried fruit. ? Avoid raw fruits. ? Avoid vegetables.  1-3 days before the procedure - follow a clear liquid diet. Avoid liquids that have red or purple dye. Drink only clear liquids, such  as: ? Clear broth or bouillon. ? Black coffee or tea. ? Clear juice. ? Clear soft drinks or sports drinks. ? Gelatin dessert. ? Popsicles.  On the day of the procedure - do not eat or drink anything during the 2 hours before the procedure.  Bowel prep If you were prescribed an oral bowel prep:  Take it as told by your doctor. Starting the day before your procedure, you will need to drink a lot of liquid. The liquid will cause you to poop (have bowel movements) until your poop is almost clear or light green.  If your skin or butt gets irritated from diarrhea, you may: ? Wipe the area with wipes that have medicine in them, such as adult wet wipes with aloe and vitamin E. ? Put something on your skin that soothes the area, such as petroleum jelly.  If you throw up (vomit) while drinking the bowel prep, take a break for up to 60 minutes. Then begin the bowel prep again. If you keep throwing up and you cannot take the bowel prep without throwing up, call your doctor.  General instructions  Ask your doctor about changing or stopping your normal medicines. This is important if you take diabetes medicines or blood thinners.  Plan to have someone take you home from the hospital or clinic. What happens during the procedure?  An IV tube may  be put into one of your veins.  You will be given medicine to help you relax (sedative).  To reduce your risk of infection: ? Your doctors will wash their hands. ? Your anal area will be washed with soap.  You will be asked to lie on your side with your knees bent.  Your doctor will get a long, thin, flexible tube ready. The tube will have a camera and a light on the end.  The tube will be put into your anus.  The tube will be gently put into your large intestine.  Air will be delivered into your large intestine to keep it open. You may feel some pressure or cramping.  The camera will be used to take photos.  A small tissue sample may be  removed from your body to be looked at under a microscope (biopsy). If any possible problems are found, the tissue will be sent to a lab for testing.  If small growths are found, your doctor may remove them and have them checked for cancer.  The tube that was put into your anus will be slowly removed. The procedure may vary among doctors and hospitals. What happens after the procedure?  Your doctor will check on you often until the medicines you were given have worn off.  Do not drive for 24 hours after the procedure.  You may have a small amount of blood in your poop.  You may pass gas.  You may have mild cramps or bloating in your belly (abdomen).  It is up to you to get the results of your procedure. Ask your doctor, or the department performing the procedure, when your results will be ready. This information is not intended to replace advice given to you by your health care provider. Make sure you discuss any questions you have with your health care provider. Document Released: 09/05/2010 Document Revised: 06/03/2016 Document Reviewed: 10/15/2015 Elsevier Interactive Patient Education  2017 [employer]  Colonoscopy, Adult, Care After This sheet gives you information about how to care for yourself after your procedure. Your health care provider may also give you more specific instructions. If you have problems or questions, contact your health care provider. What can I expect after the procedure? After the procedure, it is common to have:  A small amount of blood in your stool for 24 hours after the procedure.  Some gas.  Mild abdominal cramping or bloating.  Follow these instructions at home: General instructions   For the first 24 hours after the procedure: ? Do not drive or use machinery. ? Do not sign important documents. ? Do not drink alcohol. ? Do your regular daily activities at a slower pace than normal. ? Eat soft, easy-to-digest foods. ? Rest  often.  Take over-the-counter or prescription medicines only as told by your health care provider.  It is up to you to get the results of your procedure. Ask your health care provider, or the department performing the procedure, when your results will be ready. Relieving cramping and bloating  Try walking around when you have cramps or feel bloated.  Apply heat to your abdomen as told by your health care provider. Use a heat source that your health care provider recommends, such as a moist heat pack or a heating pad. ? Place a towel between your skin and the heat source. ? Leave the heat on for 20-30 minutes. ? Remove the heat if your skin turns bright red. This is especially important if you  are unable to feel pain, heat, or cold. You may have a greater risk of getting burned. Eating and drinking  Drink enough fluid to keep your urine clear or pale yellow.  Resume your normal diet as instructed by your health care provider. Avoid heavy or fried foods that are hard to digest.  Avoid drinking alcohol for as long as instructed by your health care provider. Contact a health care provider if:  You have blood in your stool 2-3 days after the procedure. Get help right away if:  You have more than a small spotting of blood in your stool.  You pass large blood clots in your stool.  Your abdomen is swollen.  You have nausea or vomiting.  You have a fever.  You have increasing abdominal pain that is not relieved with medicine. This information is not intended to replace advice given to you by your health care provider. Make sure you discuss any questions you have with your health care provider. Document Released: 03/17/2004 Document Revised: 04/27/2016 Document Reviewed: 10/15/2015 Elsevier Interactive Patient Education  2018 [place] Anesthesia is a term that refers to techniques, procedures, and medicines that help a person stay safe and comfortable  during a medical procedure. Monitored anesthesia care, or sedation, is one type of anesthesia. Your anesthesia specialist may recommend sedation if you will be having a procedure that does not require you to be unconscious, such as:  Cataract surgery.  A dental procedure.  A biopsy.  A colonoscopy.  During the procedure, you may receive a medicine to help you relax (sedative). There are three levels of sedation:  Mild sedation. At this level, you may feel awake and relaxed. You will be able to follow directions.  Moderate sedation. At this level, you will be sleepy. You may not remember the procedure.  Deep sedation. At this level, you will be asleep. You will not remember the procedure.  The more medicine you are given, the deeper your level of sedation will be. Depending on how you respond to the procedure, the anesthesia specialist may change your level of sedation or the type of anesthesia to fit your needs. An anesthesia specialist will monitor you closely during the procedure. Let your health care provider know about:  Any allergies you have.  All medicines you are taking, including vitamins, herbs, eye drops, creams, and over-the-counter medicines.  Any use of steroids (by mouth or as a cream).  Any problems you or family members have had with sedatives and anesthetic medicines.  Any blood disorders you have.  Any surgeries you have had.  Any medical conditions you have, such as sleep apnea.  Whether you are pregnant or may be pregnant.  Any use of cigarettes, alcohol, or street drugs. What are the risks? Generally, this is a safe procedure. However, problems may occur, including:  Getting too much medicine (oversedation).  Nausea.  Allergic reaction to medicines.  Trouble breathing. If this happens, a breathing tube may be used to help with breathing. It will be removed when you are awake and breathing on your own.  Heart trouble.  Lung trouble.  Before  the procedure Staying hydrated Follow instructions from your health care provider about hydration, which may include:  Up to 2 hours before the procedure - you may continue to drink clear liquids, such as water, clear fruit juice, black coffee, and plain tea.  Eating and drinking restrictions Follow instructions from your health care provider about eating and drinking,  which may include:  8 hours before the procedure - stop eating heavy meals or foods such as meat, fried foods, or fatty foods.  6 hours before the procedure - stop eating light meals or foods, such as toast or cereal.  6 hours before the procedure - stop drinking milk or drinks that contain milk.  2 hours before the procedure - stop drinking clear liquids.  Medicines Ask your health care provider about:  Changing or stopping your regular medicines. This is especially important if you are taking diabetes medicines or blood thinners.  Taking medicines such as aspirin and ibuprofen. These medicines can thin your blood. Do not take these medicines before your procedure if your health care provider instructs you not to.  Tests and exams  You will have a physical exam.  You may have blood tests done to show: ? How well your kidneys and liver are working. ? How well your blood can clot.  General instructions  Plan to have someone take you home from the hospital or clinic.  If you will be going home right after the procedure, plan to have someone with you for 24 hours.  What happens during the procedure?  Your blood pressure, heart rate, breathing, level of pain and overall condition will be monitored.  An IV tube will be inserted into one of your veins.  Your anesthesia specialist will give you medicines as needed to keep you comfortable during the procedure. This may mean changing the level of sedation.  The procedure will be performed. After the procedure  Your blood pressure, heart rate, breathing rate, and  blood oxygen level will be monitored until the medicines you were given have worn off.  Do not drive for 24 hours if you received a sedative.  You may: ? Feel sleepy, clumsy, or nauseous. ? Feel forgetful about what happened after the procedure. ? Have a sore throat if you had a breathing tube during the procedure. ? Vomit. This information is not intended to replace advice given to you by your health care provider. Make sure you discuss any questions you have with your health care provider. Document Released: 04/29/2005 Document Revised: 01/10/2016 Document Reviewed: 11/24/2015 Elsevier Interactive Patient Education  2018 [place], Care After These instructions provide you with information about caring for yourself after your procedure. Your health care provider may also give you more specific instructions. Your treatment has been planned according to current medical practices, but problems sometimes occur. Call your health care provider if you have any problems or questions after your procedure. What can I expect after the procedure? After your procedure, it is common to:  Feel sleepy for several hours.  Feel clumsy and have poor balance for several hours.  Feel forgetful about what happened after the procedure.  Have poor judgment for several hours.  Feel nauseous or vomit.  Have a sore throat if you had a breathing tube during the procedure.  Follow these instructions at home: For at least 24 hours after the procedure:   Do not: ? Participate in activities in which you could fall or become injured. ? Drive. ? Use heavy machinery. ? Drink alcohol. ? Take sleeping pills or medicines that cause drowsiness. ? Make important decisions or sign legal documents. ? Take care of children on your own.  Rest. Eating and drinking  Follow the diet that is recommended by your health care provider.  If you vomit, drink water, juice, or soup when you  can drink without vomiting.  Make sure you have little or no nausea before eating solid foods. General instructions  Have a responsible adult stay with you until you are awake and alert.  Take over-the-counter and prescription medicines only as told by your health care provider.  If you smoke, do not smoke without supervision.  Keep all follow-up visits as told by your health care provider. This is important. Contact a health care provider if:  You keep feeling nauseous or you keep vomiting.  You feel light-headed.  You develop a rash.  You have a fever. Get help right away if:  You have trouble breathing. This information is not intended to replace advice given to you by your health care provider. Make sure you discuss any questions you have with your health care provider. Document Released: 11/24/2015 Document Revised: 03/25/2016 Document Reviewed: 11/24/2015 Elsevier Interactive Patient Education  [employer].

## 2017-11-26 ENCOUNTER — Other Ambulatory Visit (HOSPITAL_COMMUNITY): Payer: PPO

## 2017-11-26 DIAGNOSIS — M79641 Pain in right hand: Secondary | ICD-10-CM | POA: Diagnosis not present

## 2017-11-26 DIAGNOSIS — M79645 Pain in left finger(s): Secondary | ICD-10-CM | POA: Diagnosis not present

## 2017-11-26 DIAGNOSIS — M180 Bilateral primary osteoarthritis of first carpometacarpal joints: Secondary | ICD-10-CM | POA: Diagnosis not present

## 2017-11-26 DIAGNOSIS — M79644 Pain in right finger(s): Secondary | ICD-10-CM | POA: Diagnosis not present

## 2017-11-29 ENCOUNTER — Encounter (HOSPITAL_COMMUNITY)
Admission: RE | Admit: 2017-11-29 | Discharge: 2017-11-29 | Disposition: A | Discharge status: Home / Self Care | Payer: PPO | Source: Ambulatory Visit | Attending: Internal Medicine | Admitting: Internal Medicine

## 2017-11-30 ENCOUNTER — Encounter (HOSPITAL_COMMUNITY): Payer: Self-pay

## 2017-11-30 ENCOUNTER — Other Ambulatory Visit: Payer: Self-pay

## 2017-11-30 ENCOUNTER — Encounter (HOSPITAL_COMMUNITY)
Admission: RE | Admit: 2017-11-30 | Discharge: 2017-11-30 | Disposition: A | Discharge status: Home / Self Care | Payer: PPO | Source: Ambulatory Visit | Attending: Internal Medicine | Admitting: Internal Medicine

## 2017-11-30 DIAGNOSIS — D124 Benign neoplasm of descending colon: Secondary | ICD-10-CM | POA: Diagnosis not present

## 2017-11-30 DIAGNOSIS — K644 Residual hemorrhoidal skin tags: Secondary | ICD-10-CM | POA: Diagnosis not present

## 2017-11-30 DIAGNOSIS — Z7982 Long term (current) use of aspirin: Secondary | ICD-10-CM | POA: Diagnosis not present

## 2017-11-30 DIAGNOSIS — I10 Essential (primary) hypertension: Secondary | ICD-10-CM | POA: Diagnosis not present

## 2017-11-30 DIAGNOSIS — E785 Hyperlipidemia, unspecified: Secondary | ICD-10-CM | POA: Diagnosis not present

## 2017-11-30 DIAGNOSIS — Z9049 Acquired absence of other specified parts of digestive tract: Secondary | ICD-10-CM | POA: Diagnosis not present

## 2017-11-30 DIAGNOSIS — K5900 Constipation, unspecified: Secondary | ICD-10-CM | POA: Diagnosis not present

## 2017-11-30 DIAGNOSIS — K641 Second degree hemorrhoids: Secondary | ICD-10-CM | POA: Diagnosis not present

## 2017-11-30 DIAGNOSIS — K5730 Diverticulosis of large intestine without perforation or abscess without bleeding: Secondary | ICD-10-CM | POA: Diagnosis not present

## 2017-11-30 DIAGNOSIS — Z885 Allergy status to narcotic agent status: Secondary | ICD-10-CM | POA: Diagnosis not present

## 2017-11-30 DIAGNOSIS — K219 Gastro-esophageal reflux disease without esophagitis: Secondary | ICD-10-CM | POA: Diagnosis not present

## 2017-11-30 DIAGNOSIS — Z79899 Other long term (current) drug therapy: Secondary | ICD-10-CM | POA: Diagnosis not present

## 2017-11-30 DIAGNOSIS — Z91030 Bee allergy status: Secondary | ICD-10-CM | POA: Diagnosis not present

## 2017-11-30 DIAGNOSIS — Z1211 Encounter for screening for malignant neoplasm of colon: Secondary | ICD-10-CM | POA: Diagnosis not present

## 2017-11-30 LAB — CBC WITH DIFFERENTIAL/PLATELET
Basophils Absolute: 0 10*3/uL (ref 0.0–0.1)
Basophils Relative: 0 %
Eosinophils Absolute: 0.1 10*3/uL (ref 0.0–0.7)
Eosinophils Relative: 0 %
HCT: 43.7 % (ref 39.0–52.0)
Hemoglobin: 14.9 g/dL (ref 13.0–17.0)
Lymphocytes Relative: 22 %
Lymphs Abs: 2.5 10*3/uL (ref 0.7–4.0)
MCH: 29.9 pg (ref 26.0–34.0)
MCHC: 34.1 g/dL (ref 30.0–36.0)
MCV: 87.8 fL (ref 78.0–100.0)
Monocytes Absolute: 1.3 10*3/uL — ABNORMAL HIGH (ref 0.1–1.0)
Monocytes Relative: 11 %
Neutro Abs: 7.7 10*3/uL (ref 1.7–7.7)
Neutrophils Relative %: 67 %
Platelets: 313 10*3/uL (ref 150–400)
RBC: 4.98 MIL/uL (ref 4.22–5.81)
RDW: 12.6 % (ref 11.5–15.5)
WBC: 11.5 10*3/uL — ABNORMAL HIGH (ref 4.0–10.5)

## 2017-11-30 LAB — BASIC METABOLIC PANEL
Anion gap: 12 (ref 5–15)
BUN: 29 mg/dL — ABNORMAL HIGH (ref 6–20)
CO2: 25 mmol/L (ref 22–32)
Calcium: 9.9 mg/dL (ref 8.9–10.3)
Chloride: 100 mmol/L — ABNORMAL LOW (ref 101–111)
Creatinine, Ser: 1.37 mg/dL — ABNORMAL HIGH (ref 0.61–1.24)
GFR calc Af Amer: >60 mL/min (ref >60)
GFR calc non Af Amer: 52 mL/min — ABNORMAL LOW (ref >60)
Glucose, Bld: 113 mg/dL — ABNORMAL HIGH (ref 65–99)
Potassium: 3 mmol/L — ABNORMAL LOW (ref 3.5–5.1)
Sodium: 137 mmol/L (ref 135–145)

## 2017-12-01 ENCOUNTER — Telehealth: Payer: Self-pay | Admitting: Nurse Practitioner

## 2017-12-01 DIAGNOSIS — E876 Hypokalemia: Secondary | ICD-10-CM

## 2017-12-01 MED ORDER — POTASSIUM CHLORIDE ER 10 MEQ PO TBCR: EXTENDED_RELEASE_TABLET | ORAL | 0 refills | Status: DC

## 2017-12-01 NOTE — OR Nursing (Signed)
Potassium 3.0,  Reported to Dr. Gala Romney and Dr. Lenna Sciara. Neysa Bonito PA  Spoke with Dr. Jimmye Norman .  He is to supplement patient with potassium, I stat to be done on arrival to preop.

## 2017-12-01 NOTE — Telephone Encounter (Signed)
Spoke with the patient. Received call from anesthesia about potassium 3.0. Patient is on diuretic. Sent in KCl 40 mEq to take twice today and once in the morning. Anesthesia will check iStat potassium prior to procedure.  Patient is aware and verbalized understanding, will pick up Rx now. He is to call with any questions.

## 2017-12-01 NOTE — Telephone Encounter (Signed)
-----   Message from Daneil Dolin, MD sent at 11/30/2017  3:09 PM EDT -----   ----- Message ----- From: Buel Ream, [facility] [place] Sent: 11/30/2017   2:44 PM To: Daneil Dolin, MD

## 2017-12-02 ENCOUNTER — Ambulatory Visit (HOSPITAL_COMMUNITY): Payer: PPO | Admitting: Anesthesiology

## 2017-12-02 ENCOUNTER — Encounter (HOSPITAL_COMMUNITY)
Admission: RE | Disposition: A | Discharge status: Home / Self Care | Payer: Self-pay | Source: Ambulatory Visit | Attending: Internal Medicine

## 2017-12-02 ENCOUNTER — Ambulatory Visit (HOSPITAL_COMMUNITY)
Admission: RE | Admit: 2017-12-02 | Discharge: 2017-12-02 | Disposition: A | Discharge status: Home / Self Care | Payer: PPO | Source: Ambulatory Visit | Attending: Internal Medicine | Admitting: Internal Medicine

## 2017-12-02 ENCOUNTER — Encounter (HOSPITAL_COMMUNITY): Payer: Self-pay | Admitting: *Deleted

## 2017-12-02 DIAGNOSIS — Z91030 Bee allergy status: Secondary | ICD-10-CM | POA: Insufficient documentation

## 2017-12-02 DIAGNOSIS — K5730 Diverticulosis of large intestine without perforation or abscess without bleeding: Secondary | ICD-10-CM | POA: Insufficient documentation

## 2017-12-02 DIAGNOSIS — K5900 Constipation, unspecified: Secondary | ICD-10-CM | POA: Insufficient documentation

## 2017-12-02 DIAGNOSIS — E785 Hyperlipidemia, unspecified: Secondary | ICD-10-CM | POA: Diagnosis not present

## 2017-12-02 DIAGNOSIS — Z1211 Encounter for screening for malignant neoplasm of colon: Secondary | ICD-10-CM | POA: Insufficient documentation

## 2017-12-02 DIAGNOSIS — Z9049 Acquired absence of other specified parts of digestive tract: Secondary | ICD-10-CM | POA: Insufficient documentation

## 2017-12-02 DIAGNOSIS — K641 Second degree hemorrhoids: Secondary | ICD-10-CM | POA: Insufficient documentation

## 2017-12-02 DIAGNOSIS — I10 Essential (primary) hypertension: Secondary | ICD-10-CM | POA: Insufficient documentation

## 2017-12-02 DIAGNOSIS — D124 Benign neoplasm of descending colon: Secondary | ICD-10-CM

## 2017-12-02 DIAGNOSIS — Z1212 Encounter for screening for malignant neoplasm of rectum: Secondary | ICD-10-CM

## 2017-12-02 DIAGNOSIS — Z885 Allergy status to narcotic agent status: Secondary | ICD-10-CM | POA: Diagnosis not present

## 2017-12-02 DIAGNOSIS — Z7982 Long term (current) use of aspirin: Secondary | ICD-10-CM | POA: Insufficient documentation

## 2017-12-02 DIAGNOSIS — K644 Residual hemorrhoidal skin tags: Secondary | ICD-10-CM | POA: Diagnosis not present

## 2017-12-02 DIAGNOSIS — Z79899 Other long term (current) drug therapy: Secondary | ICD-10-CM | POA: Diagnosis not present

## 2017-12-02 DIAGNOSIS — K219 Gastro-esophageal reflux disease without esophagitis: Secondary | ICD-10-CM | POA: Diagnosis not present

## 2017-12-02 HISTORY — PX: COLONOSCOPY WITH PROPOFOL: SHX5780

## 2017-12-02 LAB — POCT I-STAT 4, (NA,K, GLUC, HGB,HCT)
Glucose, Bld: 88 mg/dL (ref 65–99)
HCT: 41 % (ref 39.0–52.0)
Hemoglobin: 13.9 g/dL (ref 13.0–17.0)
Potassium: 3.8 mmol/L (ref 3.5–5.1)
Sodium: 138 mmol/L (ref 135–145)

## 2017-12-02 SURGERY — COLONOSCOPY WITH PROPOFOL
Anesthesia: Monitor Anesthesia Care

## 2017-12-02 MED ORDER — LACTATED RINGERS IV SOLN
INTRAVENOUS | Status: DC
  Administered 2017-12-02: 1000 mL via INTRAVENOUS

## 2017-12-02 MED ORDER — PROMETHAZINE HCL 25 MG/ML IJ SOLN: 6.2500 mg | INTRAMUSCULAR | Status: DC | PRN

## 2017-12-02 MED ORDER — ACETAMINOPHEN 10 MG/ML IV SOLN: 1000.0000 mg | Freq: Once | INTRAVENOUS | Status: DC | PRN

## 2017-12-02 MED ORDER — PROPOFOL 10 MG/ML IV BOLUS
INTRAVENOUS | Status: AC
  Filled 2017-12-02: qty 40

## 2017-12-02 MED ORDER — CHLORHEXIDINE GLUCONATE CLOTH 2 % EX PADS: 6.0000 | MEDICATED_PAD | Freq: Once | CUTANEOUS | Status: DC

## 2017-12-02 MED ORDER — MIDAZOLAM HCL 2 MG/2ML IJ SOLN
INTRAMUSCULAR | Status: AC
  Filled 2017-12-02: qty 2

## 2017-12-02 MED ORDER — PROPOFOL 10 MG/ML IV BOLUS
INTRAVENOUS | Status: DC | PRN
  Administered 2017-12-02: 10 mg via INTRAVENOUS

## 2017-12-02 MED ORDER — PROPOFOL 500 MG/50ML IV EMUL
INTRAVENOUS | Status: DC | PRN
  Administered 2017-12-02: 150 ug/kg/min via INTRAVENOUS

## 2017-12-02 MED ORDER — MIDAZOLAM HCL 2 MG/2ML IJ SOLN: 0.5000 mg | Freq: Once | INTRAMUSCULAR | Status: DC | PRN

## 2017-12-02 NOTE — Anesthesia Preprocedure Evaluation (Signed)
Anesthesia Evaluation  Patient identified by MRN, date of birth, ID band Patient awake    Reviewed: Allergy & Precautions, H&P , NPO status , Patient's Chart, lab work & pertinent test results, reviewed documented beta blocker date and time   Airway Mallampati: III  TM Distance: >3 FB Neck ROM: full  Mouth opening: Limited Mouth Opening  Dental no notable dental hx. (+) Teeth Intact   Pulmonary neg pulmonary ROS,    Pulmonary exam normal breath sounds clear to auscultation       Cardiovascular Exercise Tolerance: Good hypertension, negative cardio ROS Normal cardiovascular exam Rhythm:regular Rate:Normal     Neuro/Psych negative neurological ROS  negative psych ROS   GI/Hepatic negative GI ROS, Neg liver ROS,   Endo/Other  negative endocrine ROS  Renal/GU negative Renal ROS  negative genitourinary   Musculoskeletal   Abdominal   Peds  Hematology negative hematology ROS (+)   Anesthesia Other Findings No clinical complaints  Reproductive/Obstetrics negative OB ROS                             Anesthesia Physical Anesthesia Plan  ASA: II  Anesthesia Plan: MAC   Post-op Pain Management:    Induction:   PONV Risk Score and Plan:   Airway Management Planned:   Additional Equipment:   Intra-op Plan:   Post-operative Plan:   Informed Consent: I have reviewed the patients History and Physical, chart, labs and discussed the procedure including the risks, benefits and alternatives for the proposed anesthesia with the patient or authorized representative who has indicated his/her understanding and acceptance.   Dental Advisory Given  Plan Discussed with: CRNA  Anesthesia Plan Comments:         Anesthesia Quick Evaluation

## 2017-12-02 NOTE — Discharge Instructions (Signed)
Colonoscopy Discharge Instructions  Read the instructions outlined below and refer to this sheet in the next few weeks. These discharge instructions provide you with general information on caring for yourself after you leave the hospital. Your doctor may also give you specific instructions. While your treatment has been planned according to the most current medical practices available, unavoidable complications occasionally occur. If you have any problems or questions after discharge, call Dr. Gala Romney at [phone]. ACTIVITY  You may resume your regular activity, but move at a slower pace for the next 24 hours.   Take frequent rest periods for the next 24 hours.   Walking will help get rid of the air and reduce the bloated feeling in your belly (abdomen).   No driving for 24 hours (because of the medicine (anesthesia) used during the test).    Do not sign any important legal documents or operate any machinery for 24 hours (because of the anesthesia used during the test).  NUTRITION  Drink plenty of fluids.   You may resume your normal diet as instructed by your doctor.   Begin with a light meal and progress to your normal diet. Heavy or fried foods are harder to digest and may make you feel sick to your stomach (nauseated).   Avoid alcoholic beverages for 24 hours or as instructed.  MEDICATIONS  You may resume your normal medications unless your doctor tells you otherwise.  WHAT YOU CAN EXPECT TODAY  Some feelings of bloating in the abdomen.   Passage of more gas than usual.   Spotting of blood in your stool or on the toilet paper.  IF YOU HAD POLYPS REMOVED DURING THE COLONOSCOPY:  No aspirin products for 7 days or as instructed.   No alcohol for 7 days or as instructed.   Eat a soft diet for the next 24 hours.  FINDING OUT THE RESULTS OF YOUR TEST Not all test results are available during your visit. If your test results are not back during the visit, make an appointment  with your caregiver to find out the results. Do not assume everything is normal if you have not heard from your caregiver or the medical facility. It is important for you to follow up on all of your test results.  SEEK IMMEDIATE MEDICAL ATTENTION IF:  You have more than a spotting of blood in your stool.   Your belly is swollen (abdominal distention).   You are nauseated or vomiting.   You have a temperature over 101.   You have abdominal pain or discomfort that is severe or gets worse throughout the day.    Colon polyp, diverticulosis and constipation information provided  1 polyp removed from your colon today.  In addition to Colace and Metamucil,  could try one capful of MiraLAX daily as needed for constipation  Further recommendations to follow pending review of pathology report   PATIENT INSTRUCTIONS POST-ANESTHESIA  IMMEDIATELY FOLLOWING SURGERY:  Do not drive or operate machinery for the first twenty four hours after surgery.  Do not make any important decisions for twenty four hours after surgery or while taking narcotic pain medications or sedatives.  If you develop intractable nausea and vomiting or a severe headache please notify your doctor immediately.  FOLLOW-UP:  Please make an appointment with your surgeon as instructed. You do not need to follow up with anesthesia unless specifically instructed to do so.  WOUND CARE INSTRUCTIONS (if applicable):  Keep a dry clean dressing on the anesthesia/puncture wound site  if there is drainage.  Once the wound has quit draining you may leave it open to air.  Generally you should leave the bandage intact for twenty four hours unless there is drainage.  If the epidural site drains for more than 36-48 hours please call the anesthesia department.  QUESTIONS?:  Please feel free to call your physician or the hospital operator if you have any questions, and they will be happy to assist you.        Colon Polyps Polyps are tissue  growths inside the body. Polyps can grow in many places, including the large intestine (colon). A polyp may be a round bump or a mushroom-shaped growth. You could have one polyp or several. Most colon polyps are noncancerous (benign). However, some colon polyps can become cancerous over time. What are the causes? The exact cause of colon polyps is not known. What increases the risk? This condition is more likely to develop in people who:  Have a family history of colon cancer or colon polyps.  Are older than 22 or older than 45 if they are African American.  Have inflammatory bowel disease, such as ulcerative colitis or Crohn disease.  Are overweight.  Smoke cigarettes.  Do not get enough exercise.  Drink too much alcohol.  Eat a diet that is: ? High in fat and red meat. ? Low in fiber.  Had childhood cancer that was treated with abdominal radiation.  What are the signs or symptoms? Most polyps do not cause symptoms. If you have symptoms, they may include:  Blood coming from your rectum when having a bowel movement.  Blood in your stool.The stool may look dark red or black.  A change in bowel habits, such as constipation or diarrhea.  How is this diagnosed? This condition is diagnosed with a colonoscopy. This is a procedure that uses a lighted, flexible scope to look at the inside of your colon. How is this treated? Treatment for this condition involves removing any polyps that are found. Those polyps will then be tested for cancer. If cancer is found, your health care provider will talk to you about options for colon cancer treatment. Follow these instructions at home: Diet  Eat plenty of fiber, such as fruits, vegetables, and whole grains.  Eat foods that are high in calcium and vitamin D, such as milk, cheese, yogurt, eggs, liver, fish, and broccoli.  Limit foods high in fat, red meats, and processed meats, such as hot dogs, sausage, bacon, and lunch  meats.  Maintain a healthy weight, or lose weight if recommended by your health care provider. General instructions  Do not smoke cigarettes.  Do not drink alcohol excessively.  Keep all follow-up visits as told by your health care provider. This is important. This includes keeping regularly scheduled colonoscopies. Talk to your health care provider about when you need a colonoscopy.  Exercise every day or as told by your health care provider. Contact a health care provider if:  You have new or worsening bleeding during a bowel movement.  You have new or increased blood in your stool.  You have a change in bowel habits.  You unexpectedly lose weight. This information is not intended to replace advice given to you by your health care provider. Make sure you discuss any questions you have with your health care provider. Document Released: 04/29/2004 Document Revised: 01/09/2016 Document Reviewed: 06/24/2015 Elsevier Interactive Patient Education  [employer].     Diverticulosis Diverticulosis is a condition that  develops when small pouches (diverticula) form in the wall of the large intestine (colon). The colon is where water is absorbed and stool is formed. The pouches form when the inside layer of the colon pushes through weak spots in the outer layers of the colon. You may have a few pouches or many of them. What are the causes? The cause of this condition is not known. What increases the risk? The following factors may make you more likely to develop this condition:  Being older than age 17. Your risk for this condition increases with age. Diverticulosis is rare among people younger than age 23. By age 80, many people have it.  Eating a low-fiber diet.  Having frequent constipation.  Being overweight.  Not getting enough exercise.  Smoking.  Taking over-the-counter pain medicines, like aspirin and ibuprofen.  Having a family history of diverticulosis.  What  are the signs or symptoms? In most people, there are no symptoms of this condition. If you do have symptoms, they may include:  Bloating.  Cramps in the abdomen.  Constipation or diarrhea.  Pain in the lower left side of the abdomen.  How is this diagnosed? This condition is most often diagnosed during an exam for other colon problems. Because diverticulosis usually has no symptoms, it often cannot be diagnosed independently. This condition may be diagnosed by:  Using a flexible scope to examine the colon (colonoscopy).  Taking an X-ray of the colon after dye has been put into the colon (barium enema).  Doing a CT scan.  How is this treated? You may not need treatment for this condition if you have never developed an infection related to diverticulosis. If you have had an infection before, treatment may include:  Eating a high-fiber diet. This may include eating more fruits, vegetables, and grains.  Taking a fiber supplement.  Taking a live bacteria supplement (probiotic).  Taking medicine to relax your colon.  Taking antibiotic medicines.  Follow these instructions at home:  Drink 6-8 glasses of water or more each day to prevent constipation.  Try not to strain when you have a bowel movement.  If you have had an infection before: ? Eat more fiber as directed by your health care provider or your diet and nutrition specialist (dietitian). ? Take a fiber supplement or probiotic, if your health care provider approves.  Take over-the-counter and prescription medicines only as told by your health care provider.  If you were prescribed an antibiotic, take it as told by your health care provider. Do not stop taking the antibiotic even if you start to feel better.  Keep all follow-up visits as told by your health care provider. This is important. Contact a health care provider if:  You have pain in your abdomen.  You have bloating.  You have cramps.  You have not had a  bowel movement in 3 days. Get help right away if:  Your pain gets worse.  Your bloating becomes very bad.  You have a fever or chills, and your symptoms suddenly get worse.  You vomit.  You have bowel movements that are bloody or black.  You have bleeding from your rectum. Summary  Diverticulosis is a condition that develops when small pouches (diverticula) form in the wall of the large intestine (colon).  You may have a few pouches or many of them.  This condition is most often diagnosed during an exam for other colon problems.  If you have had an infection related to diverticulosis, treatment  may include increasing the fiber in your diet, taking supplements, or taking medicines. This information is not intended to replace advice given to you by your health care provider. Make sure you discuss any questions you have with your health care provider. Document Released: 04/30/2004 Document Revised: 06/22/2016 Document Reviewed: 06/22/2016 Elsevier Interactive Patient Education  2017 [employer].    Constipation, Adult Constipation is when a person has fewer bowel movements in a week than normal, has difficulty having a bowel movement, or has stools that are dry, hard, or larger than normal. Constipation may be caused by an underlying condition. It may become worse with age if a person takes certain medicines and does not take in enough fluids. Follow these instructions at home: Eating and drinking   Eat foods that have a lot of fiber, such as fresh fruits and vegetables, whole grains, and beans.  Limit foods that are high in fat, low in fiber, or overly processed, such as french fries, hamburgers, cookies, candies, and soda.  Drink enough fluid to keep your urine clear or pale yellow. General instructions  Exercise regularly or as told by your health care provider.  Go to the restroom when you have the urge to go. Do not hold it in.  Take over-the-counter and prescription  medicines only as told by your health care provider. These include any fiber supplements.  Practice pelvic floor retraining exercises, such as deep breathing while relaxing the lower abdomen and pelvic floor relaxation during bowel movements.  Watch your condition for any changes.  Keep all follow-up visits as told by your health care provider. This is important. Contact a health care provider if:  You have pain that gets worse.  You have a fever.  You do not have a bowel movement after 4 days.  You vomit.  You are not hungry.  You lose weight.  You are bleeding from the anus.  You have thin, pencil-like stools. Get help right away if:  You have a fever and your symptoms suddenly get worse.  You leak stool or have blood in your stool.  Your abdomen is bloated.  You have severe pain in your abdomen.  You feel dizzy or you faint. This information is not intended to replace advice given to you by your health care provider. Make sure you discuss any questions you have with your health care provider. Document Released: 05/01/2004 Document Revised: 02/21/2016 Document Reviewed: 01/22/2016 Elsevier Interactive Patient Education  2018 [employer].

## 2017-12-02 NOTE — Op Note (Signed)
[facility] Patient Name: [name] Procedure Date: 12/02/2017 8:13 AM MRN: [id number] Date of Birth: 01-Sep-1950 Attending MD: Norvel Richards , MD CSN: [id number] Age: 67 Admit Type: Outpatient Procedure:                Colonoscopy Indications:              Screening for colorectal malignant neoplasm Providers:                Norvel Richards, MD, Hinton Rao, RN, Aram Candela Referring MD:             Edwinna Areola. Hall MD Medicines:                Propofol per Anesthesia Complications:            No immediate complications. Estimated Blood Loss:     Estimated blood loss was minimal. Procedure:                Pre-Anesthesia Assessment:                           - Prior to the procedure, a History and Physical                            was performed, and patient medications and                            allergies were reviewed. The patient's tolerance of                            previous anesthesia was also reviewed. The risks                            and benefits of the procedure and the sedation                            options and risks were discussed with the patient.                            All questions were answered, and informed consent                            was obtained. Prior Anticoagulants: The patient has                            taken no previous anticoagulant or antiplatelet                            agents. ASA Grade Assessment: II - A patient with                            mild systemic disease. After reviewing the risks  and benefits, the patient was deemed in                            satisfactory condition to undergo the procedure.                           After obtaining informed consent, the colonoscope                            was passed under direct vision. Throughout the                            procedure, the patient's blood pressure, pulse, and                             oxygen saturations were monitored continuously. The                            EC38-[id number] ([id number]) scope was introduced through                            the and advanced to the the cecum, identified by                            appendiceal orifice and ileocecal valve. The                            colonoscopy was performed without difficulty. The                            patient tolerated the procedure well. The quality                            of the bowel preparation was adequate. The                            ileocecal valve, appendiceal orifice, and rectum                            were photographed. The entire colon was well                            visualized. Scope In: 8:36:38 AM Scope Out: 8:53:06 AM Scope Withdrawal Time: 0 hours 9 minutes 53 seconds  Total Procedure Duration: 0 hours 16 minutes 28 seconds  Findings:      The perianal and digital rectal examinations were normal.      Non-bleeding external and internal hemorrhoids were found during       endoscopy. The hemorrhoids were moderate, medium-sized and Grade II       (internal hemorrhoids that prolapse but reduce spontaneously).      Scattered small and large-mouthed diverticula were found in the sigmoid       colon and descending colon.      A 7 mm polyp was found in the descending colon. The polyp was sessile.  The polyp was removed with a cold snare. Resection and retrieval were       complete. Estimated blood loss was minimal.      The exam was otherwise without abnormality on direct and retroflexion       views. Impression:               - Non-bleeding external and internal hemorrhoids.                           - Diverticulosis in the sigmoid colon and in the                            descending colon.                           - One 7 mm polyp in the descending colon, removed                            with a cold snare. Resected and retrieved.                           - The  examination was otherwise normal on direct                            and retroflexion views. Moderate Sedation:      Moderate (conscious) sedation was personally administered by an       anesthesia professional. The following parameters were monitored: oxygen       saturation, heart rate, blood pressure, respiratory rate, EKG, adequacy       of pulmonary ventilation, and response to care. Total physician       intraservice time was 24 minutes. Recommendation:           - Patient has a contact number available for                            emergencies. The signs and symptoms of potential                            delayed complications were discussed with the                            patient. Return to normal activities tomorrow.                            Written discharge instructions were provided to the                            patient.                           - Resume previous diet.                           - Continue present medications.                           -  Repeat colonoscopy date to be determined after                            pending pathology results are reviewed for                            surveillance based on pathology results.                           - Return to GI office (date not yet determined). Procedure Code(s):        --- Professional ---                           [phone], Colonoscopy, flexible; with removal of                            tumor(s), polyp(s), or other lesion(s) by snare                            technique Diagnosis Code(s):        --- Professional ---                           Z12.11, Encounter for screening for malignant                            neoplasm of colon                           K64.1, Second degree hemorrhoids                           D12.4, Benign neoplasm of descending colon                           K57.30, Diverticulosis of large intestine without                            perforation or abscess without  bleeding CPT copyright 2017 American [employer] Association. All rights reserved. The codes documented in this report are preliminary and upon coder review may  be revised to meet current compliance requirements. Cristopher Estimable. Sanai Frick, MD Norvel Richards, MD 12/02/2017 9:00:08 AM This report has been signed electronically. Number of Addenda: 0

## 2017-12-02 NOTE — Anesthesia Postprocedure Evaluation (Signed)
Anesthesia Post Note  Patient: [name]  Procedure(s) Performed: COLONOSCOPY WITH PROPOFOL (N/A )  Patient location during evaluation: PACU Anesthesia Type: MAC Level of consciousness: awake and alert and oriented Pain management: pain level controlled Vital Signs Assessment: post-procedure vital signs reviewed and stable Respiratory status: spontaneous breathing Cardiovascular status: blood pressure returned to baseline and stable Postop Assessment: no apparent nausea or vomiting Anesthetic complications: no     Last Vitals:  Vitals:   12/02/17 0730 12/02/17 0735  BP: 122/76   Pulse:    Resp: 15 17  Temp:    SpO2: 97% 99%    Last Pain:  Vitals:   12/02/17 0710  TempSrc: Oral  PainSc: 0-No pain                 Angelica Wix

## 2017-12-02 NOTE — Interval H&P Note (Signed)
History and Physical Interval Note:  12/02/2017 7:34 AM  [name]  has presented today for surgery, with the diagnosis of screening colonoscopy  The various methods of treatment have been discussed with the patient and family. After consideration of risks, benefits and other options for treatment, the patient has consented to  Procedure(s) with comments: COLONOSCOPY WITH PROPOFOL (N/A) - 8:30am as a surgical intervention .  The patient's history has been reviewed, patient examined, no change in status, stable for surgery.  I have reviewed the patient's chart and labs.  Questions were answered to the patient's satisfaction.     Robert Rourk  No change. Screening colonoscopy per plan.  The risks, benefits, limitations, alternatives and imponderables have been reviewed with the patient. Questions have been answered. All parties are agreeable.

## 2017-12-02 NOTE — Transfer of Care (Signed)
Immediate Anesthesia Transfer of Care Note  Patient: [name]  Procedure(s) Performed: COLONOSCOPY WITH PROPOFOL (N/A )  Patient Location: PACU  Anesthesia Type:MAC  Level of Consciousness: awake and alert   Airway & Oxygen Therapy: Patient Spontanous Breathing  Post-op Assessment: Report given to RN  Post vital signs: Reviewed and stable  Last Vitals:  Vitals Value Taken Time  BP 103/70 12/02/2017  9:00 AM  Temp    Pulse 58 12/02/2017  9:01 AM  Resp 20 12/02/2017  9:01 AM  SpO2 93 % 12/02/2017  9:01 AM  Vitals shown include unvalidated device data.  Last Pain:  Vitals:   12/02/17 0710  TempSrc: Oral  PainSc: 0-No pain      Patients Stated Pain Goal: 8 (25/00/37 0488)  Complications: No apparent anesthesia complications

## 2017-12-04 ENCOUNTER — Encounter: Payer: Self-pay | Admitting: Internal Medicine

## 2017-12-08 ENCOUNTER — Encounter (HOSPITAL_COMMUNITY): Payer: Self-pay | Admitting: Internal Medicine

## 2017-12-09 NOTE — Addendum Note (Signed)
Addendum  created 12/09/17 1817 by Vista Deck, CRNA   Intraprocedure Staff edited

## 2017-12-23 DIAGNOSIS — M180 Bilateral primary osteoarthritis of first carpometacarpal joints: Secondary | ICD-10-CM | POA: Diagnosis not present

## 2018-02-04 DIAGNOSIS — M4326 Fusion of spine, lumbar region: Secondary | ICD-10-CM | POA: Diagnosis not present

## 2018-02-04 DIAGNOSIS — M5106 Intervertebral disc disorders with myelopathy, lumbar region: Secondary | ICD-10-CM | POA: Diagnosis not present

## 2018-02-04 DIAGNOSIS — M545 Low back pain: Secondary | ICD-10-CM | POA: Diagnosis not present

## 2018-02-04 DIAGNOSIS — M4716 Other spondylosis with myelopathy, lumbar region: Secondary | ICD-10-CM | POA: Diagnosis not present

## 2018-02-08 ENCOUNTER — Encounter: Payer: Self-pay | Admitting: Nurse Practitioner

## 2018-02-08 ENCOUNTER — Ambulatory Visit (INDEPENDENT_AMBULATORY_CARE_PROVIDER_SITE_OTHER): Payer: PPO | Admitting: Nurse Practitioner

## 2018-02-08 DIAGNOSIS — K5900 Constipation, unspecified: Secondary | ICD-10-CM | POA: Diagnosis not present

## 2018-02-08 DIAGNOSIS — K649 Unspecified hemorrhoids: Secondary | ICD-10-CM

## 2018-02-08 NOTE — Patient Instructions (Addendum)
1. Continue taking Colace. 2. Add Metamucil every other day back into your bowel regimen. 3. Call us if this does not improve your constipation. 4. You can adjust the dosage of your Metamucil and/or Colace based on how you respond. 5. Follow-up in 6 months. 6. Call us if you have any questions or concerns.  At [facility] we value your feedback. You may receive a survey about your visit today. Please share your experience as we strive to create trusting relationships with our patients to provide genuine, compassionate, quality care.  It was great to see you today!  I hope you have a great summer!!

## 2018-02-08 NOTE — Progress Notes (Signed)
CC'D TO PCP °

## 2018-02-08 NOTE — Assessment & Plan Note (Signed)
Known hemorrhoids.  This is confirmed on recent colonoscopy which found internal and external nonbleeding hemorrhoids.  The patient does not have any rectal bleeding at this time.  Recommend better control of constipation to prevent hemorrhoid symptoms.  He is not requiring topical therapy at this time.  If he does in the future we can recommend topical therapy and if that is ineffective he may eventually benefit from hemorrhoid banding and/or hemorrhoid surgery.  Follow-up in 6 months.

## 2018-02-08 NOTE — Progress Notes (Signed)
Referring Provider: [name], MD Primary Care Physician:  [name], MD Primary GI:  Dr. Gala Romney  Chief Complaint  Patient presents with  . Constipation    BM is every 3 days, straining at times, no bleeding    HPI:   [name] is a 68 y.o. male who presents for follow-up on constipation.  The patient was last seen in our office 11/02/2017 for constipation, hemorrhoids. and to schedule a colonoscopy. His last colonoscopy in our system was 2011 in [place] with recommended 10 year repeat. At his last visit he noted his last colonoscopy was at [facility] and is about 6 months overdue. Has constipation and uses Metamucil and if he doesn't take this every other day he will not have a bowel movement. If he takes it he will have regular bowel movements but still with hard stools and straining. Constipation causes hemorrhoid flares. No other GI symptoms. Recommended colonoscopy, continue metamucil, add Colace stool softener. Follow-up in 3 months.   Colonoscopy completed 12/02/17 which found non-bleeding internal and external hemorrhoids, diverticulosis in the sigmoid and descending colon, a 7 mm polyp in the descending colon s/p removal, otherwise normal. Surgical pathology found the polyp to be tubular adenoma. Recommended continue current medications, repeat colonoscopy 5 years.  Today he states he's doing ok overall. Constipation is not doing well. Thought he was told to take stool softeners INSTEAD of Metamucil, AVS indicated recommend to take WITH Metamucil. Clarified with him. On Colace only, having a bowel movement about every 3 days. Denies abdominal pain, N/V, hematochezia, melena. Denies chest pain, dyspnea, dizziness, lightheadedness, syncope, near syncope. Denies any other upper or lower GI symptoms.   Past Medical History:  Diagnosis Date  . Anemia    COUPLE OF YRS AGO- NO PROBLEMS SINCE  . Complication of anesthesia    REMEMBERS [place] [facility]  . GERD  (gastroesophageal reflux disease)    RARE   . Hyperlipidemia   . Hypertension   . Pain    RIGHT SHOULDER - ROTATOR CUFF TEAR    Past Surgical History:  Procedure Laterality Date  . BACK SURGERY  2006   LOWER BACK FUSION - SCREWS AND RODS  . BACK SURGERY  06/2017  . CATARACT EXTRACTION W/PHACO Right 11/26/2015   Procedure: CATARACT EXTRACTION PHACO AND INTRAOCULAR LENS PLACEMENT (IOC);  Surgeon: [name], MD;  Location: [facility];  Service: Ophthalmology;  Laterality: Right;  CDE:6.97  . CATARACT EXTRACTION W/PHACO Left 12/10/2015   Procedure: CATARACT EXTRACTION PHACO AND INTRAOCULAR LENS PLACEMENT (IOC);  Surgeon: [name], MD;  Location: [facility];  Service: Ophthalmology;  Laterality: Left;  CDE:4.25  . CHOLECYSTECTOMY    . COLONOSCOPY WITH PROPOFOL N/A 12/02/2017   Procedure: COLONOSCOPY WITH PROPOFOL;  Surgeon: Daneil Dolin, MD;  [facility]: [facility];  Service: Endoscopy;  Laterality: N/A;  8:30am  . FINGERNAIL REMOVED RT INDEX FINGER - FOR CYST THAT WAS BEHIND THE NAIL    . SHOULDER OPEN ROTATOR CUFF REPAIR Right 09/01/2013   Procedure: RIGHT SHOULDER MINI OPEN ROTATOR CUFF REPAIR WITH SUBACROMIAL DECOMPRESSION;  Surgeon: [name], MD;  Location: [facility];  Service: Orthopedics;  Laterality: Right;  . TONSILLECTOMY     SURGERY AS A CHILD    Current Outpatient Medications  Medication Sig Dispense Refill  . aspirin EC 81 MG tablet Take 81 mg by mouth daily.    Marland Kitchen atorvastatin (LIPITOR) 20 MG tablet Take 20 mg by mouth daily with  breakfast.     . docusate sodium (COLACE) 100 MG capsule Take 100 mg by mouth daily.    Marland Kitchen ibuprofen (ADVIL,MOTRIN) 200 MG tablet Take 600 mg by mouth every 6 (six) hours as needed for headache or moderate pain.    Marland Kitchen losartan-hydrochlorothiazide (HYZAAR) 100-25 MG tablet Take 1 tablet by mouth daily.  1  . Omega-3 Fatty Acids (FISH OIL) 1200 MG CAPS Take 2,400 mg by mouth daily.     . pantoprazole (PROTONIX) 40 MG tablet Take 40 mg by mouth  daily.     No current facility-administered medications for this visit.     Allergies as of 02/08/2018 - Review Complete 02/08/2018  Allergen Reaction Noted  . Bee venom Itching 03/06/2016  . Codeine Itching 04/13/2013    Family History  Problem Relation Age of Onset  . Hypertension Mother   . Kidney failure Mother   . Diabetes Mother   . Colon cancer Neg Hx     Social History   Socioeconomic History  . Marital status: Married    Spouse name: Not on file  . Number of children: Not on file  . Years of education: Not on file  . Highest education level: Not on file  Occupational History  . Not on file  Social Needs  . Financial resource strain: Not on file  . Food insecurity:    Worry: Not on file    Inability: Not on file  . Transportation needs:    Medical: Not on file    Non-medical: Not on file  Tobacco Use  . Smoking status: Never Smoker  . Smokeless tobacco: Current User    Types: Snuff  Substance and Sexual Activity  . Alcohol use: No  . Drug use: No  . Sexual activity: Yes    Birth control/protection: None  Lifestyle  . Physical activity:    Days per week: Not on file    Minutes per session: Not on file  . Stress: Not on file  Relationships  . Social connections:    Talks on phone: Not on file    Gets together: Not on file    Attends religious service: Not on file    Active member of club or organization: Not on file    Attends meetings of clubs or organizations: Not on file    Relationship status: Not on file  Other Topics Concern  . Not on file  Social History Narrative  . Not on file    Review of Systems: General: Negative for anorexia, weight loss, fever, chills, fatigue, weakness. ENT: Negative for hoarseness, difficulty swallowing. CV: Negative for chest pain, angina, palpitations, peripheral edema.  Respiratory: Negative for dyspnea at rest, cough, sputum, wheezing.  GI: See history of present illness. MS: Admits chronic back pain and  now s/p back surgery 06/2017.  Endo: Negative for unusual weight change.    Physical Exam: BP (!) 155/96   Pulse 60   Temp (!) 97.1 F (36.2 C) (Oral)   Ht 5\' 9"  (1.753 m)   Wt 214 lb 3.2 oz (97.2 kg)   BMI 31.63 kg/m  General:   Alert and oriented. Pleasant and cooperative. Well-nourished and well-developed.  Eyes:  Without icterus, sclera clear and conjunctiva pink.  Ears:  Normal auditory acuity. Cardiovascular:  S1, S2 present without murmurs appreciated. Extremities without clubbing or edema. Respiratory:  Clear to auscultation bilaterally. No wheezes, rales, or rhonchi. No distress.  Gastrointestinal:  +BS, soft, non-tender and non-distended. No HSM noted. No  guarding or rebound. No masses appreciated.  Rectal:  Deferred  Musculoskalatal:  Symmetrical without gross deformities. Neurologic:  Alert and oriented x4;  grossly normal neurologically. Psych:  Alert and cooperative. Normal mood and affect. Heme/Lymph/Immune: No excessive bruising noted.    02/08/2018 9:27 AM   Disclaimer: This note was dictated with voice recognition software. Similar sounding words can inadvertently be transcribed and may not be corrected upon review.

## 2018-02-08 NOTE — Assessment & Plan Note (Signed)
Chronic history of constipation.  At his last visit he misunderstood his instructions to add Colace to his Metamucil regimen.  Instead, he substituted Colace for his Metamucil regimen.  Understandably, his constipation is not well controlled today.  I recommended he add Metamucil back as he was taking it (every other day).  Continue Colace.  Follow-up in 6 months.  Call if no improvement in constipation symptoms and we can recommend other options over the phone.  Follow-up in 6 months.

## 2018-02-10 DIAGNOSIS — M13841 Other specified arthritis, right hand: Secondary | ICD-10-CM | POA: Diagnosis not present

## 2018-02-10 DIAGNOSIS — M13842 Other specified arthritis, left hand: Secondary | ICD-10-CM | POA: Diagnosis not present

## 2018-02-23 DIAGNOSIS — K219 Gastro-esophageal reflux disease without esophagitis: Secondary | ICD-10-CM | POA: Diagnosis not present

## 2018-02-23 DIAGNOSIS — Z6831 Body mass index (BMI) 31.0-31.9, adult: Secondary | ICD-10-CM | POA: Diagnosis not present

## 2018-02-23 DIAGNOSIS — H93A9 Pulsatile tinnitus, unspecified ear: Secondary | ICD-10-CM | POA: Diagnosis not present

## 2018-02-23 DIAGNOSIS — Z6832 Body mass index (BMI) 32.0-32.9, adult: Secondary | ICD-10-CM | POA: Diagnosis not present

## 2018-02-23 DIAGNOSIS — M5136 Other intervertebral disc degeneration, lumbar region: Secondary | ICD-10-CM | POA: Diagnosis not present

## 2018-02-23 DIAGNOSIS — I10 Essential (primary) hypertension: Secondary | ICD-10-CM | POA: Diagnosis not present

## 2018-02-23 DIAGNOSIS — Z01818 Encounter for other preprocedural examination: Secondary | ICD-10-CM | POA: Diagnosis not present

## 2018-02-23 DIAGNOSIS — M545 Low back pain: Secondary | ICD-10-CM | POA: Diagnosis not present

## 2018-02-23 DIAGNOSIS — E782 Mixed hyperlipidemia: Secondary | ICD-10-CM | POA: Diagnosis not present

## 2018-02-28 DIAGNOSIS — Z0000 Encounter for general adult medical examination without abnormal findings: Secondary | ICD-10-CM | POA: Diagnosis not present

## 2018-02-28 DIAGNOSIS — I10 Essential (primary) hypertension: Secondary | ICD-10-CM | POA: Diagnosis not present

## 2018-02-28 DIAGNOSIS — M545 Low back pain: Secondary | ICD-10-CM | POA: Diagnosis not present

## 2018-02-28 DIAGNOSIS — M65849 Other synovitis and tenosynovitis, unspecified hand: Secondary | ICD-10-CM | POA: Diagnosis not present

## 2018-02-28 DIAGNOSIS — E782 Mixed hyperlipidemia: Secondary | ICD-10-CM | POA: Diagnosis not present

## 2018-02-28 DIAGNOSIS — Z6831 Body mass index (BMI) 31.0-31.9, adult: Secondary | ICD-10-CM | POA: Diagnosis not present

## 2018-02-28 DIAGNOSIS — K219 Gastro-esophageal reflux disease without esophagitis: Secondary | ICD-10-CM | POA: Diagnosis not present

## 2018-02-28 DIAGNOSIS — R252 Cramp and spasm: Secondary | ICD-10-CM | POA: Diagnosis not present

## 2018-03-03 DIAGNOSIS — E782 Mixed hyperlipidemia: Secondary | ICD-10-CM | POA: Diagnosis not present

## 2018-03-03 DIAGNOSIS — I10 Essential (primary) hypertension: Secondary | ICD-10-CM | POA: Diagnosis not present

## 2018-03-03 DIAGNOSIS — K219 Gastro-esophageal reflux disease without esophagitis: Secondary | ICD-10-CM | POA: Diagnosis not present

## 2018-05-12 DIAGNOSIS — M13842 Other specified arthritis, left hand: Secondary | ICD-10-CM | POA: Diagnosis not present

## 2018-05-12 DIAGNOSIS — M13841 Other specified arthritis, right hand: Secondary | ICD-10-CM | POA: Diagnosis not present

## 2018-05-16 DIAGNOSIS — D485 Neoplasm of uncertain behavior of skin: Secondary | ICD-10-CM | POA: Diagnosis not present

## 2018-05-16 DIAGNOSIS — D225 Melanocytic nevi of trunk: Secondary | ICD-10-CM | POA: Diagnosis not present

## 2018-05-16 DIAGNOSIS — Z1283 Encounter for screening for malignant neoplasm of skin: Secondary | ICD-10-CM | POA: Diagnosis not present

## 2018-05-31 DIAGNOSIS — M1812 Unilateral primary osteoarthritis of first carpometacarpal joint, left hand: Secondary | ICD-10-CM | POA: Diagnosis not present

## 2018-06-01 DIAGNOSIS — M13842 Other specified arthritis, left hand: Secondary | ICD-10-CM | POA: Diagnosis not present

## 2018-06-01 DIAGNOSIS — G8918 Other acute postprocedural pain: Secondary | ICD-10-CM | POA: Diagnosis not present

## 2018-06-01 DIAGNOSIS — M1812 Unilateral primary osteoarthritis of first carpometacarpal joint, left hand: Secondary | ICD-10-CM | POA: Diagnosis not present

## 2018-06-04 DIAGNOSIS — Z6832 Body mass index (BMI) 32.0-32.9, adult: Secondary | ICD-10-CM | POA: Diagnosis not present

## 2018-06-04 DIAGNOSIS — I10 Essential (primary) hypertension: Secondary | ICD-10-CM | POA: Diagnosis not present

## 2018-06-16 DIAGNOSIS — Z5189 Encounter for other specified aftercare: Secondary | ICD-10-CM | POA: Diagnosis not present

## 2018-06-16 DIAGNOSIS — M13842 Other specified arthritis, left hand: Secondary | ICD-10-CM | POA: Diagnosis not present

## 2018-06-17 HISTORY — PX: OTHER SURGICAL HISTORY: SHX169

## 2018-07-04 DIAGNOSIS — Z5189 Encounter for other specified aftercare: Secondary | ICD-10-CM | POA: Diagnosis not present

## 2018-07-04 DIAGNOSIS — M13842 Other specified arthritis, left hand: Secondary | ICD-10-CM | POA: Diagnosis not present

## 2018-07-12 DIAGNOSIS — M13842 Other specified arthritis, left hand: Secondary | ICD-10-CM | POA: Diagnosis not present

## 2018-07-12 DIAGNOSIS — M13841 Other specified arthritis, right hand: Secondary | ICD-10-CM | POA: Diagnosis not present

## 2018-07-13 ENCOUNTER — Encounter (HOSPITAL_COMMUNITY): Payer: Self-pay

## 2018-07-13 ENCOUNTER — Other Ambulatory Visit: Payer: Self-pay

## 2018-07-13 ENCOUNTER — Ambulatory Visit (HOSPITAL_COMMUNITY): Payer: PPO | Attending: Orthopedic Surgery

## 2018-07-13 DIAGNOSIS — R29898 Other symptoms and signs involving the musculoskeletal system: Secondary | ICD-10-CM | POA: Insufficient documentation

## 2018-07-13 DIAGNOSIS — R278 Other lack of coordination: Secondary | ICD-10-CM | POA: Diagnosis not present

## 2018-07-13 DIAGNOSIS — M25532 Pain in left wrist: Secondary | ICD-10-CM

## 2018-07-13 DIAGNOSIS — M25632 Stiffness of left wrist, not elsewhere classified: Secondary | ICD-10-CM | POA: Insufficient documentation

## 2018-07-13 NOTE — Patient Instructions (Signed)
Home Exercises Program Theraputty Exercises  Do the following exercises 2 times a day using your affected hand.  1. Roll putty into a ball.  2. Make into a pancake.  3. Roll putty into a roll.  4. Pinch along log with first finger and thumb.   5. Make into a ball.  6. Roll it back into a log.   7. Pinch using thumb and side of first finger.  8. Roll into a ball, then flatten into a pancake.  9. Roll back into a ball and gently squeeze 8 times.    Passive Thumb Composite Flexion  With your palm up, use your index finger to passively flex your thumb towards the base of your small finger.   10 times. 1 set     PROM - Arnold the subject's wrist or hand and then provide a gentle stretch to the thumb outward as shown.   10 times. 1 set     Passive Wrist Flexion  With your elbow in at your side, place your other hand along the dorsal hand of the targeted wrist. Place gentle pressure inward and down, to flex the targeted wrist.   10 times. 1 set     Wrist Flexor Stretch   Hold both arms out in front of you. Using the arm you want to stretch, point your fingers up towards the ceiling with your elbow straight. Using your other hand, pull your fingers back towards your wrist until you feel a stretch along your forearm. 10 times. 1 set        Use for the swelling. 10 minutes on. 10 minutes off.   Try to elevate the hand above your heart when you're sitting and not doing your exercises.

## 2018-07-13 NOTE — Therapy (Signed)
[facility] [place], [place], [address] Phone: [phone]   Fax:  [phone]  Occupational Therapy Evaluation  Patient Details  Name: [name] MRN: [id number] Date of Birth: [birth year] Referring Provider (OT): Dr. Iran Planas   Encounter Date: 07/13/2018  OT End of Session - 07/13/18 1254    Visit Number  1    Number of Visits  24    Date for OT Re-Evaluation  10/05/18   mini reassess: 08/10/18   Authorization Type  [employer] $15 copay    Authorization Time Period  no visit limit    OT Start Time  0955    OT Stop Time  1045    OT Time Calculation (min)  50 min    Activity Tolerance  Patient tolerated treatment well    Behavior During Therapy  [facility] for tasks assessed/performed       Past Medical History:  Diagnosis Date  . Anemia    COUPLE OF YRS AGO- NO PROBLEMS SINCE  . Complication of anesthesia    REMEMBERS [place] [facility]  . GERD (gastroesophageal reflux disease)    RARE   . Hyperlipidemia   . Hypertension   . Pain    RIGHT SHOULDER - ROTATOR CUFF TEAR    Past Surgical History:  Procedure Laterality Date  . BACK SURGERY  2006   LOWER BACK FUSION - SCREWS AND RODS  . BACK SURGERY  06/2017  . CATARACT EXTRACTION W/PHACO Right 11/26/2015   Procedure: CATARACT EXTRACTION PHACO AND INTRAOCULAR LENS PLACEMENT (IOC);  Surgeon: [name], MD;  Location: [facility];  Service: Ophthalmology;  Laterality: Right;  CDE:6.97  . CATARACT EXTRACTION W/PHACO Left 12/10/2015   Procedure: CATARACT EXTRACTION PHACO AND INTRAOCULAR LENS PLACEMENT (IOC);  Surgeon: [name], MD;  Location: [facility];  Service: Ophthalmology;  Laterality: Left;  CDE:4.25  . CHOLECYSTECTOMY    . COLONOSCOPY WITH PROPOFOL N/A 12/02/2017   Procedure: COLONOSCOPY WITH PROPOFOL;  Surgeon: Daneil Dolin, MD;  [facility];  Service: Endoscopy;  Laterality: N/A;  8:30am  . FINGERNAIL REMOVED RT INDEX  FINGER - FOR CYST THAT WAS BEHIND THE NAIL    . SHOULDER OPEN ROTATOR CUFF REPAIR Right 09/01/2013   Procedure: RIGHT SHOULDER MINI OPEN ROTATOR CUFF REPAIR WITH SUBACROMIAL DECOMPRESSION;  Surgeon: [name], MD;  Location[facility];  Service: Orthopedics;  Laterality: Right;  . TONSILLECTOMY     SURGERY AS A CHILD    There were no vitals filed for this visit.  Subjective Assessment - 07/13/18 1001    Subjective   S: I had arthritis really bad. I'm going to need the same surgery in the right hand also.     Pertinent History  Patient is a 67 y/o male S/P left thumb CMC arthroplasty which was performed on 06/01/18 due to severe arthritis. Patient was recently placed in a fabricated thumb spica splint on 07/12/18 which he is to wear at all time unless bathing and completing HEP. Dr. [name] had referred patient to occupational therapy for evaluation and treatment.     Patient Stated Goals  To be able to use my left hand normally.    Currently in Pain?  Yes    Pain Score  8     Pain Location  Finger (Comment which one)   thumb    Pain Orientation  Left    Pain Descriptors / Indicators  Sore;Aching    Pain Type  Acute pain    Pain Radiating Towards  N/A    Pain Onset  More than a month ago    Pain Frequency  Occasional    Aggravating Factors   movement    Pain Relieving Factors  rest,     Effect of Pain on Daily Activities  Unable to use his left hand for any daily tasks.    Multiple Pain Sites  No        [facility] OT Assessment - 07/13/18 1002      Assessment   Medical Diagnosis  S/P left CMC athroplasty    Referring Provider (OT)  Dr. Iran Planas    Onset Date/Surgical Date  06/01/18    Hand Dominance  Right    Next MD Visit  08/23/18    Prior Therapy  None for this condition      Precautions   Precautions  Other (comment)    Precaution Comments  Follow [facility] protocol: Gentle ROM of thumb and wrist, Week 8-10 (12/11-12/25): light strengthening.  ok to progress to  soft CMC brace at 10 weeks. No precautions at 12 weeks post op.    Required Braces or Orthoses  Other Brace/Splint    Other Brace/Splint  left thumb spica splint      Restrictions   Weight Bearing Restrictions  --    LUE Weight Bearing  --      Balance Screen   Has the patient fallen in the past 6 months  No      Home  Environment   Family/patient expects to be discharged to:  Private residence      Prior Function   Level of [place]  Retired    Leisure  Enjoys working around [employer].       ADL   ADL comments  Unable to utilize his left hand for any daily tasks at this time.       Mobility   Mobility Status  Independent      Written Expression   Dominant Hand  Right      Vision - History   Baseline Vision  No visual deficits      Cognition   Overall Cognitive Status  Within Functional Limits for tasks assessed      Observation/Other Assessments   Skin Integrity  2 healed incision with steri strips. 4.5 cm and 4 cm along CMC joint region of left hand.       Coordination   9 Hole Peg Test  Right;Left    Right 9 Hole Peg Test  19.2"    Left 9 Hole Peg Test  50.0"   with therapist providing set up     Edema   Edema  Wrist R: 19 cm, L: 20 cm. Circumference thumb CMC R: 13 cm, L: 14 cm      ROM / Strength   AROM / PROM / Strength  AROM;PROM;Strength      Palpation   Palpation comment  Moderate facial restrictions in left palm, thenar eminance, wrist and forearm      AROM   Overall AROM   Deficits    AROM Assessment Site  Wrist    Right/Left Wrist  Left    Left Wrist Extension  42 Degrees   right: 62   Left Wrist Flexion  30 Degrees   right: 76   Left Wrist Radial Deviation  2 Degrees   right: 20   Left Wrist Ulnar Deviation  26 Degrees   right: 42     PROM   Overall PROM   Deficits    PROM Assessment Site  Wrist    Right/Left Wrist  Left    Left Wrist Extension  44 Degrees    Left Wrist Flexion  46 Degrees    Left Wrist  Radial Deviation  8 Degrees    Left Wrist Ulnar Deviation  32 Degrees      Strength   Overall Strength  Deficits    Strength Assessment Site  Hand;Wrist    Right/Left Wrist  Left    Left Wrist Flexion  3-/5    Left Wrist Extension  3-/5    Left Wrist Radial Deviation  3-/5    Left Wrist Ulnar Deviation  3-/5    Right/Left hand  Left;Right    Right Hand Grip (lbs)  100    Right Hand Lateral Pinch  26 lbs    Right Hand 3 Point Pinch  16 lbs    Left Hand Grip (lbs)  19    Left Hand Lateral Pinch  0 lbs    Left Hand 3 Point Pinch  0 lbs                      OT Education - 07/13/18 1253    Education Details  yellow theraputty with instructions to gently complete exercises. No force. Wrist and thumb P/ROM and A/ROM    Person(s) Educated  Patient    Methods  Explanation;Demonstration;Verbal cues;Handout    Comprehension  Returned demonstration;Verbalized understanding       OT Short Term Goals - 07/13/18 1329      OT SHORT TERM GOAL #1   Title  Patient will be educated and independent with his HEP in order to facilitate his progress in therapy and increase the use of his left hand with simple daily tasks.     Time  6    Period  Weeks    Status  New    Target Date  08/24/18      OT SHORT TERM GOAL #2   Title  Patient will increase his fine motor coordination by completing the 9 hole peg test in 40 seconds or less without need for peg setup.     Time  6    Period  Weeks    Status  New      OT SHORT TERM GOAL #3   Title  Patient will increase his grip strength by 10# and his pinch streny by 5# in order to be able to hold onto lightweight items at home.     Time  6    Period  Weeks    Status  New      OT SHORT TERM GOAL #4   Title  Patient will report a pain level of 5/10 or less when using his left hand for daily tasks.     Time  6    Period  Weeks    Status  New      OT SHORT TERM GOAL #5   Title  patient will increase his left hand and wrist A/ROM to  [facility] in order to be able to grasp items such as the steering wheel and phone with his left hand.     Time  6    Period  Weeks    Status  New        OT Long Term Goals - 07/13/18 1528  OT LONG TERM GOAL #1   Title  Patient will return to highest level of independence while using his left hand for all daily tasks 75% of the time or more.     Time  12    Period  Weeks    Status  New    Target Date  10/05/18      OT LONG TERM GOAL #2   Title  Patient will increase fine motor coordination by completing the 9 hole peg test in 30 seconds or less in order to return to completing simple household tasks.     Time  12    Period  Weeks    Status  New      OT LONG TERM GOAL #3   Title  Patient will increase left hand grip strength by 15# and pinch strength by 10# in order to hold tools and items of weight without difficulty.     Time  12    Period  Weeks    Status  New      OT LONG TERM GOAL #4   Title  Patient will increase left wrist strength to 4+/5 in order to utilize his left hand to push up from a chair and hold items of 5# or greater.     Time  12    Period  Weeks    Status  New      OT LONG TERM GOAL #5   Title  Patient will decrease fascial restrictions to trace amount in his left hand in order to increase the functional mobility needed to complete daily tasks.     Time  12    Period  Weeks    Status  New      Long Term Additional Goals   Additional Long Term Goals  Yes            Plan - 07/13/18 1255    Clinical Impression Statement  A: Patient is a 67 y/o male S/P left thumb CMC arthoplasty causing increased pain, fascial retrictions, and decreased ROM and strength resulting in difficulty completing daily tasks using his LUE.     Occupational Profile and client history currently impacting functional performance  motivated to return to prior level of function. Strong social support at home.     Occupational performance deficits (Please refer to evaluation for  details):  ADL's;IADL's;Rest and Sleep;Work;Leisure    Rehab Potential  Excellent    Current Impairments/barriers affecting progress:  None noted    OT Frequency  2x / week    OT Duration  12 weeks    OT Treatment/Interventions  Self-care/ADL training;Therapeutic exercise;Ultrasound;Neuromuscular education;Manual Therapy;Splinting;Therapeutic activities;Cryotherapy;DME and/or AE instruction;Paraffin;Compression bandaging;Scar mobilization;Electrical Stimulation;Moist Heat;Contrast Bath;Passive range of motion;Patient/family education    Plan  P: Patient will benefit from skilled OT services to increase functional performance during ADL tasks in order to complete daily tasks using his LUE as his nondominant. Treatment Plan: Follow protocol. Myofascial release, passive stretching, edema management with use of kinsiotape, P/ROM, A/ROM, gentle strengthening of wrist, grip and pinch strengthening, fine motor coordination.     Clinical Decision Making  Several treatment options, min-mod task modification necessary    Consulted and Agree with Plan of Care  Patient       Patient will benefit from skilled therapeutic intervention in order to improve the following deficits and impairments:  Decreased scar mobility, Decreased strength, Decreased range of motion, Increased edema, Pain, Impaired UE functional use, Increased fascial restrictions, Decreased coordination  Visit Diagnosis:  Other symptoms and signs involving the musculoskeletal system - Plan: Ot plan of care cert/re-cert  Stiffness of left wrist, not elsewhere classified - Plan: Ot plan of care cert/re-cert  Pain in left wrist - Plan: Ot plan of care cert/re-cert  Other lack of coordination - Plan: Ot plan of care cert/re-cert    Problem List Patient Active Problem List   Diagnosis Date Noted  . Hemorrhoids 02/08/2018  . Constipation 11/02/2017  . Taking medication for chronic disease 11/02/2017  . Right rotator cuff tear 09/01/2013  .  Rotator cuff tear, right 09/01/2013   Ailene Ravel, OTR/L,CBIS  [phone]  07/13/2018, 3:44 PM  [place] [address] [place], [place], [address] Phone: [phone]   Fax:  [phone]  Name: [name] MRN: [id number] Date of Birth: February 24, 1951

## 2018-07-19 ENCOUNTER — Ambulatory Visit (HOSPITAL_COMMUNITY): Payer: PPO | Attending: Orthopedic Surgery

## 2018-07-19 DIAGNOSIS — R278 Other lack of coordination: Secondary | ICD-10-CM | POA: Diagnosis not present

## 2018-07-19 DIAGNOSIS — M25632 Stiffness of left wrist, not elsewhere classified: Secondary | ICD-10-CM | POA: Insufficient documentation

## 2018-07-19 DIAGNOSIS — M25532 Pain in left wrist: Secondary | ICD-10-CM | POA: Diagnosis not present

## 2018-07-19 DIAGNOSIS — R29898 Other symptoms and signs involving the musculoskeletal system: Secondary | ICD-10-CM | POA: Diagnosis not present

## 2018-07-19 NOTE — Therapy (Signed)
[place], [place], [address] Phone: [phone]   Fax:  [phone]  Occupational Therapy Treatment  Patient Details  Name: [name] MRN: [id number] Date of Birth: 01/06/1951 Referring Provider (OT): Dr. Iran Planas   Encounter Date: 07/19/2018  OT End of Session - 07/19/18 1455    Visit Number  2    Number of Visits  24    Date for OT Re-Evaluation  10/05/18   mini reassess: 08/10/18   Authorization Type  [employer] $15 copay    Authorization Time Period  no visit limit    OT Start Time  1350    OT Stop Time  1430    OT Time Calculation (min)  40 min    Activity Tolerance  Patient tolerated treatment well    Behavior During Therapy  [facility] for tasks assessed/performed       Past Medical History:  Diagnosis Date  . Anemia    COUPLE OF YRS AGO- NO PROBLEMS SINCE  . Complication of anesthesia    REMEMBERS [place] [facility]  . GERD (gastroesophageal reflux disease)    RARE   . Hyperlipidemia   . Hypertension   . Pain    RIGHT SHOULDER - ROTATOR CUFF TEAR    Past Surgical History:  Procedure Laterality Date  . BACK SURGERY  2006   LOWER BACK FUSION - SCREWS AND RODS  . BACK SURGERY  06/2017  . CATARACT EXTRACTION W/PHACO Right 11/26/2015   Procedure: CATARACT EXTRACTION PHACO AND INTRAOCULAR LENS PLACEMENT (IOC);  Surgeon: [name], MD;  Location: [facility];  Service: Ophthalmology;  Laterality: Right;  CDE:6.97  . CATARACT EXTRACTION W/PHACO Left 12/10/2015   Procedure: CATARACT EXTRACTION PHACO AND INTRAOCULAR LENS PLACEMENT (IOC);  Surgeon: [name], MD;  Location: [facility];  Service: Ophthalmology;  Laterality: Left;  CDE:4.25  . CHOLECYSTECTOMY    . COLONOSCOPY WITH PROPOFOL N/A 12/02/2017   Procedure: COLONOSCOPY WITH PROPOFOL;  Surgeon: Daneil Dolin, MD;  [facility];  Service: Endoscopy;  Laterality: N/A;  8:30am  . FINGERNAIL REMOVED RT INDEX FINGER  - FOR CYST THAT WAS BEHIND THE NAIL    . SHOULDER OPEN ROTATOR CUFF REPAIR Right 09/01/2013   Procedure: RIGHT SHOULDER MINI OPEN ROTATOR CUFF REPAIR WITH SUBACROMIAL DECOMPRESSION;  Surgeon: [name], MD;  Location: [facility];  Service: Orthopedics;  Laterality: Right;  . TONSILLECTOMY     SURGERY AS A CHILD    There were no vitals filed for this visit.  Subjective Assessment - 07/19/18 1416    Subjective   S: It's been feeling a lot better than it has been.     Currently in Pain?  No/denies         [facility] OT Assessment - 07/19/18 1416      Assessment   Medical Diagnosis  S/P left CMC athroplasty      Precautions   Precautions  Other (comment)    Precaution Comments  Follow [place] protocol: Gentle ROM of thumb and wrist, Week 8-10 (12/11-12/25): light strengthening.  ok to progress to soft CMC brace at 10 weeks. No precautions at 12 weeks post op.    Required Braces or Orthoses  Other Brace/Splint    Other Brace/Splint  left thumb spica splint         Katina Dung - 07/19/18 1424    Open a tight or new jar  Unable    Do heavy  household chores (wash walls, wash floors)  Unable    Carry a shopping bag or briefcase  No difficulty    Wash your back  Unable    Use a knife to cut food  Unable    Recreational activities in which you take some force or impact through your arm, shoulder, or hand (golf, hammering, tennis)  Unable    During the past week, to what extent has your arm, shoulder or hand problem interfered with your normal social activities with family, friends, neighbors, or groups?  Modererately    During the past week, to what extent has your arm, shoulder or hand problem limited your work or other regular daily activities  Slightly    Arm, shoulder, or hand pain.  Mild    Tingling (pins and needles) in your arm, shoulder, or hand  None    Difficulty Sleeping  No difficulty    DASH Score  54.55 %           OT Treatments/Exercises (OP) - 07/19/18  1417      Exercises   Exercises  Hand;Wrist      Additional Wrist Exercises   Sponges  11,18      Hand Exercises   MCPJ Flexion  PROM;10 reps    MCPJ Extension  PROM;10 reps    Opposition  PROM;10 reps    Other Hand Exercises  thumb taps on table; 10X      Manual Therapy   Manual Therapy  Edema management;Myofascial release    Manual therapy comments  manual therapy completed prior to exercises    Edema Management  Edema management techniques completed to left thumb, wrist, and forearm to decrease swelling and increase joint mobility.     Myofascial Release  Myofascial release and manual stretching completed to left thumb, wrist, and forearm region to decrease fascial restrictions and increase joint mobility in a pain free zone.       Fine Motor Coordination (Hand/Wrist)   Fine Motor Coordination  Grooved pegs    Grooved pegs  Pt picked up 3 pegs one at a time while transfering pegs from palm then back to finger tip working on in hand manipulation Pt then placed pegs in board with left hand. Pt removed pegs while attempting to hold all pegs in the palm of his hand.              OT Education - 07/19/18 1439    Education Details  splint fit asssessed as patient is showing increased edema. Thumb hole was largened and patient was instructed to wear the sockette inside out and to complete self edema massage to help decrease swelling. Reviewed goals with patient.     Person(s) Educated  Patient    Methods  Explanation;Demonstration    Comprehension  Verbalized understanding;Returned demonstration       OT Short Term Goals - 07/19/18 1456      OT SHORT TERM GOAL #1   Title  Patient will be educated and independent with his HEP in order to facilitate his progress in therapy and increase the use of his left hand with simple daily tasks.     Time  6    Period  Weeks    Status  On-going      OT SHORT TERM GOAL #2   Title  Patient will increase his fine motor coordination by  completing the 9 hole peg test in 40 seconds or less without need for peg setup.  Time  6    Period  Weeks    Status  On-going      OT SHORT TERM GOAL #3   Title  Patient will increase his grip strength by 10# and his pinch streny by 5# in order to be able to hold onto lightweight items at home.     Time  6    Period  Weeks    Status  On-going      OT SHORT TERM GOAL #4   Title  Patient will report a pain level of 5/10 or less when using his left hand for daily tasks.     Time  6    Period  Weeks    Status  On-going      OT SHORT TERM GOAL #5   Title  patient will increase his left hand and wrist A/ROM to [facility] in order to be able to grasp items such as the steering wheel and phone with his left hand.     Time  6    Period  Weeks    Status  On-going        OT Long Term Goals - 07/19/18 1456      OT LONG TERM GOAL #1   Title  Patient will return to highest level of independence while using his left hand for all daily tasks 75% of the time or more.     Time  12    Period  Weeks    Status  On-going      OT LONG TERM GOAL #2   Title  Patient will increase fine motor coordination by completing the 9 hole peg test in 30 seconds or less in order to return to completing simple household tasks.     Time  12    Period  Weeks    Status  On-going      OT LONG TERM GOAL #3   Title  Patient will increase left hand grip strength by 15# and pinch strength by 10# in order to hold tools and items of weight without difficulty.     Time  12    Period  Weeks    Status  On-going      OT LONG TERM GOAL #4   Title  Patient will increase left wrist strength to 4+/5 in order to utilize his left hand to push up from a chair and hold items of 5# or greater.     Time  12    Period  Weeks    Status  On-going      OT LONG TERM GOAL #5   Title  Patient will decrease fascial restrictions to trace amount in his left hand in order to increase the functional mobility needed to complete daily tasks.      Time  12    Period  Weeks    Status  On-going            Plan - 07/19/18 1456    Clinical Impression Statement  A: Initiated myofascial release, manual stretching, and gentle A/ROM exercises to increase joint mobility and functional use of left hand. Pt with increased edeam due to sockette and splint. Adjusted splint and made recommendations to wear sockette inside out to prevent seam from cutting into left thumb thenar eminance. VC for form and technique during session.     Plan  P: Continue with edema management techniques and passive stretching to left hand and wrist. Complete coordination task utilizing tweezers.  Consulted and Agree with Plan of Care  Patient       Patient will benefit from skilled therapeutic intervention in order to improve the following deficits and impairments:  Decreased scar mobility, Decreased strength, Decreased range of motion, Increased edema, Pain, Impaired UE functional use, Increased fascial restrictions, Decreased coordination  Visit Diagnosis: Other symptoms and signs involving the musculoskeletal system  Stiffness of left wrist, not elsewhere classified  Pain in left wrist  Other lack of coordination    Problem List Patient Active Problem List   Diagnosis Date Noted  . Hemorrhoids 02/08/2018  . Constipation 11/02/2017  . Taking medication for chronic disease 11/02/2017  . Right rotator cuff tear 09/01/2013  . Rotator cuff tear, right 09/01/2013   Ailene Ravel, OTR/L,CBIS  [phone]  07/19/2018, 3:09 PM  [place] [address] [place], [place], [address] Phone: [phone]   Fax:  [phone]  Name: [name] MRN: [id number] Date of Birth: 14-Nov-1950

## 2018-07-20 ENCOUNTER — Telehealth (HOSPITAL_COMMUNITY): Payer: Self-pay | Admitting: Internal Medicine

## 2018-07-20 NOTE — Telephone Encounter (Signed)
07/20/18  pt cx said he had something going on with his car and has to get that fixed

## 2018-07-21 ENCOUNTER — Ambulatory Visit (HOSPITAL_COMMUNITY): Payer: PPO

## 2018-07-26 ENCOUNTER — Ambulatory Visit (HOSPITAL_COMMUNITY): Payer: PPO | Admitting: Occupational Therapy

## 2018-07-26 ENCOUNTER — Encounter (HOSPITAL_COMMUNITY): Payer: Self-pay | Admitting: Occupational Therapy

## 2018-07-26 DIAGNOSIS — K219 Gastro-esophageal reflux disease without esophagitis: Secondary | ICD-10-CM | POA: Diagnosis not present

## 2018-07-26 DIAGNOSIS — M25632 Stiffness of left wrist, not elsewhere classified: Secondary | ICD-10-CM

## 2018-07-26 DIAGNOSIS — I10 Essential (primary) hypertension: Secondary | ICD-10-CM | POA: Diagnosis not present

## 2018-07-26 DIAGNOSIS — M25532 Pain in left wrist: Secondary | ICD-10-CM

## 2018-07-26 DIAGNOSIS — E782 Mixed hyperlipidemia: Secondary | ICD-10-CM | POA: Diagnosis not present

## 2018-07-26 DIAGNOSIS — R278 Other lack of coordination: Secondary | ICD-10-CM

## 2018-07-26 DIAGNOSIS — R51 Headache: Secondary | ICD-10-CM | POA: Diagnosis not present

## 2018-07-26 DIAGNOSIS — R29898 Other symptoms and signs involving the musculoskeletal system: Secondary | ICD-10-CM

## 2018-07-26 DIAGNOSIS — Z01818 Encounter for other preprocedural examination: Secondary | ICD-10-CM | POA: Diagnosis not present

## 2018-07-26 NOTE — Therapy (Signed)
[place], [place], [address] Phone: [phone]   Fax:  [phone]  Occupational Therapy Treatment  Patient Details  Name: [name] MRN: [id number] Date of Birth: Dec 30, 1950 Referring Provider (OT): Dr. Iran Planas   Encounter Date: 07/26/2018  OT End of Session - 07/26/18 1119    Visit Number  3    Number of Visits  24    Date for OT Re-Evaluation  10/05/18   mini reassess: 08/10/18   Authorization Type  [employer] $15 copay    Authorization Time Period  no visit limit    OT Start Time  1036    OT Stop Time  1116    OT Time Calculation (min)  40 min    Activity Tolerance  Patient tolerated treatment well    Behavior During Therapy  [facility] for tasks assessed/performed       Past Medical History:  Diagnosis Date  . Anemia    COUPLE OF YRS AGO- NO PROBLEMS SINCE  . Complication of anesthesia    REMEMBERS [place] [facility]  . GERD (gastroesophageal reflux disease)    RARE   . Hyperlipidemia   . Hypertension   . Pain    RIGHT SHOULDER - ROTATOR CUFF TEAR    Past Surgical History:  Procedure Laterality Date  . BACK SURGERY  2006   LOWER BACK FUSION - SCREWS AND RODS  . BACK SURGERY  06/2017  . CATARACT EXTRACTION W/PHACO Right 11/26/2015   Procedure: CATARACT EXTRACTION PHACO AND INTRAOCULAR LENS PLACEMENT (IOC);  Surgeon: [name], MD;  Location: [facility];  Service: Ophthalmology;  Laterality: Right;  CDE:6.97  . CATARACT EXTRACTION W/PHACO Left 12/10/2015   Procedure: CATARACT EXTRACTION PHACO AND INTRAOCULAR LENS PLACEMENT (IOC);  Surgeon: [name], MD;  Location: [facility];  Service: Ophthalmology;  Laterality: Left;  CDE:4.25  . CHOLECYSTECTOMY    . COLONOSCOPY WITH PROPOFOL N/A 12/02/2017   Procedure: COLONOSCOPY WITH PROPOFOL;  Surgeon: Daneil Dolin, MD;  [facility];  Service: Endoscopy;  Laterality: N/A;  8:30am  . FINGERNAIL REMOVED RT INDEX FINGER  - FOR CYST THAT WAS BEHIND THE NAIL    . SHOULDER OPEN ROTATOR CUFF REPAIR Right 09/01/2013   Procedure: RIGHT SHOULDER MINI OPEN ROTATOR CUFF REPAIR WITH SUBACROMIAL DECOMPRESSION;  Surgeon: [name], MD;  [facility];  Service: Orthopedics;  Laterality: Right;  . TONSILLECTOMY     SURGERY AS A CHILD    There were no vitals filed for this visit.  Subjective Assessment - 07/26/18 1036    Subjective   S: I think it gets a little better every day.     Currently in Pain?  No/denies         [facility] OT Assessment - 07/26/18 1036      Assessment   Medical Diagnosis  S/P left CMC athroplasty      Precautions   Precautions  Other (comment)    Precaution Comments  Follow [place] protocol: Gentle ROM of thumb and wrist, Week 8-10 (12/11-12/25): light strengthening.  ok to progress to soft CMC brace at 10 weeks. No precautions at 12 weeks post op.    Required Braces or Orthoses  Other Brace/Splint    Other Brace/Splint  left thumb spica splint               OT Treatments/Exercises (OP) - 07/26/18 1037      Exercises   Exercises  Hand;Wrist      Wrist Exercises   Wrist Flexion  PROM;5 reps;AROM;10 reps    Wrist Extension  PROM;5 reps;AROM;10 reps    Wrist Radial Deviation  PROM;5 reps;AROM;10 reps    Wrist Ulnar Deviation  PROM;5 reps;AROM;10 reps      Additional Wrist Exercises   Sponges  20, 21      Hand Exercises   MCPJ Flexion  PROM;10 reps    MCPJ Extension  PROM;10 reps    Thumb Opposition  Working to reach base of 5th digit MCP joint. Pt lacking approximately 3-4cm from touching base. 10X      Manual Therapy   Manual Therapy  Edema management;Myofascial release    Manual therapy comments  manual therapy completed prior to exercises    Edema Management  Edema management techniques completed to left thumb, wrist, and forearm to decrease swelling and increase joint mobility.     Myofascial Release  Myofascial release and manual stretching  completed to left thumb, wrist, and forearm region to decrease fascial restrictions and increase joint mobility in a pain free zone.       Fine Motor Coordination (Hand/Wrist)   Fine Motor Coordination  Small Pegboard;Manipulating coins;Stacking coins    Small Pegboard  Pt used tweezers to place pegs in pegboard working on using thumb and index fingers. Min difficulty    Manipulating coins  Pt held 10 coins in palm and worked on palm to fingertip translation using thumb. Mod difficulty, slightly increased time    Stacking coins  Pt held 10 coins in palm and worked on Product/process development scientist to bring to Patent examiner. Min/mod difficulty, slightly increased time               OT Short Term Goals - 07/19/18 1456      OT SHORT TERM GOAL #1   Title  Patient will be educated and independent with his HEP in order to facilitate his progress in therapy and increase the use of his left hand with simple daily tasks.     Time  6    Period  Weeks    Status  On-going      OT SHORT TERM GOAL #2   Title  Patient will increase his fine motor coordination by completing the 9 hole peg test in 40 seconds or less without need for peg setup.     Time  6    Period  Weeks    Status  On-going      OT SHORT TERM GOAL #3   Title  Patient will increase his grip strength by 10# and his pinch streny by 5# in order to be able to hold onto lightweight items at home.     Time  6    Period  Weeks    Status  On-going      OT SHORT TERM GOAL #4   Title  Patient will report a pain level of 5/10 or less when using his left hand for daily tasks.     Time  6    Period  Weeks    Status  On-going      OT SHORT TERM GOAL #5   Title  patient will increase his left hand and wrist A/ROM to [facility] in order to be able to grasp items such as the steering wheel and phone with his left hand.     Time  6    Period  Weeks    Status  On-going  OT Long Term Goals - 07/19/18 1456      OT LONG TERM GOAL #1    Title  Patient will return to highest level of independence while using his left hand for all daily tasks 75% of the time or more.     Time  12    Period  Weeks    Status  On-going      OT LONG TERM GOAL #2   Title  Patient will increase fine motor coordination by completing the 9 hole peg test in 30 seconds or less in order to return to completing simple household tasks.     Time  12    Period  Weeks    Status  On-going      OT LONG TERM GOAL #3   Title  Patient will increase left hand grip strength by 15# and pinch strength by 10# in order to hold tools and items of weight without difficulty.     Time  12    Period  Weeks    Status  On-going      OT LONG TERM GOAL #4   Title  Patient will increase left wrist strength to 4+/5 in order to utilize his left hand to push up from a chair and hold items of 5# or greater.     Time  12    Period  Weeks    Status  On-going      OT LONG TERM GOAL #5   Title  Patient will decrease fascial restrictions to trace amount in his left hand in order to increase the functional mobility needed to complete daily tasks.     Time  12    Period  Weeks    Status  On-going            Plan - 07/26/18 1119    Clinical Impression Statement  A: Continued with manual therapy to address edema at thumb and wrist. Continued with gentle A/ROM working to improve joint mobility, added wrist exercises today. Pt completed in-hand manipulation tasks working on improving thumb mobility. Verbal cuing for technique during tasks.     Plan  P: Continue with edema management as needed, add gentle pinch strengthening task with yellow clothespin       Patient will benefit from skilled therapeutic intervention in order to improve the following deficits and impairments:  Decreased scar mobility, Decreased strength, Decreased range of motion, Increased edema, Pain, Impaired UE functional use, Increased fascial restrictions, Decreased coordination  Visit Diagnosis: Other  symptoms and signs involving the musculoskeletal system  Stiffness of left wrist, not elsewhere classified  Pain in left wrist  Other lack of coordination    Problem List Patient Active Problem List   Diagnosis Date Noted  . Hemorrhoids 02/08/2018  . Constipation 11/02/2017  . Taking medication for chronic disease 11/02/2017  . Right rotator cuff tear 09/01/2013  . Rotator cuff tear, right 09/01/2013   Guadelupe Sabin, OTR/L  [phone] 07/26/2018, 11:22 AM  [place], [place], [address] Phone: [phone]   Fax:  [phone]  Name: [name] MRN: [id number] Date of Birth: [birth year]

## 2018-07-28 ENCOUNTER — Encounter (HOSPITAL_COMMUNITY): Payer: Self-pay | Admitting: Occupational Therapy

## 2018-07-28 ENCOUNTER — Ambulatory Visit (HOSPITAL_COMMUNITY): Payer: PPO | Admitting: Occupational Therapy

## 2018-07-28 DIAGNOSIS — Z0000 Encounter for general adult medical examination without abnormal findings: Secondary | ICD-10-CM | POA: Diagnosis not present

## 2018-07-28 DIAGNOSIS — M25532 Pain in left wrist: Secondary | ICD-10-CM

## 2018-07-28 DIAGNOSIS — M25632 Stiffness of left wrist, not elsewhere classified: Secondary | ICD-10-CM

## 2018-07-28 DIAGNOSIS — I10 Essential (primary) hypertension: Secondary | ICD-10-CM | POA: Diagnosis not present

## 2018-07-28 DIAGNOSIS — R29898 Other symptoms and signs involving the musculoskeletal system: Secondary | ICD-10-CM | POA: Diagnosis not present

## 2018-07-28 DIAGNOSIS — G4489 Other headache syndrome: Secondary | ICD-10-CM | POA: Diagnosis not present

## 2018-07-28 NOTE — Therapy (Signed)
[place], [place], [address] Phone: [phone]   Fax:  [phone]  Occupational Therapy Treatment  Patient Details  Name: [name] MRN: [id number] Date of Birth: 1951-07-05 Referring Provider (OT): Dr. Iran Planas   Encounter Date: 07/28/2018  OT End of Session - 07/28/18 1205    Visit Number  4    Number of Visits  24    Date for OT Re-Evaluation  10/05/18   mini reassess: 08/10/18   Authorization Type  [employer] $15 copay    Authorization Time Period  no visit limit    OT Start Time  0948    OT Stop Time  1028    OT Time Calculation (min)  40 min    Activity Tolerance  Patient tolerated treatment well    Behavior During Therapy  [facility] for tasks assessed/performed       Past Medical History:  Diagnosis Date  . Anemia    COUPLE OF YRS AGO- NO PROBLEMS SINCE  . Complication of anesthesia    REMEMBERS [place] [facility]  . GERD (gastroesophageal reflux disease)    RARE   . Hyperlipidemia   . Hypertension   . Pain    RIGHT SHOULDER - ROTATOR CUFF TEAR    Past Surgical History:  Procedure Laterality Date  . BACK SURGERY  2006   LOWER BACK FUSION - SCREWS AND RODS  . BACK SURGERY  06/2017  . CATARACT EXTRACTION W/PHACO Right 11/26/2015   Procedure: CATARACT EXTRACTION PHACO AND INTRAOCULAR LENS PLACEMENT (IOC);  Surgeon: [name], MD;  Location: [facility];  Service: Ophthalmology;  Laterality: Right;  CDE:6.97  . CATARACT EXTRACTION W/PHACO Left 12/10/2015   Procedure: CATARACT EXTRACTION PHACO AND INTRAOCULAR LENS PLACEMENT (IOC);  Surgeon: [name], MD;  Location: [facility];  Service: Ophthalmology;  Laterality: Left;  CDE:4.25  . CHOLECYSTECTOMY    . COLONOSCOPY WITH PROPOFOL N/A 12/02/2017   Procedure: COLONOSCOPY WITH PROPOFOL;  Surgeon: Daneil Dolin, MD;  [facility];  Service: Endoscopy;  Laterality: N/A;  8:30am  . FINGERNAIL REMOVED RT INDEX FINGER  - FOR CYST THAT WAS BEHIND THE NAIL    . SHOULDER OPEN ROTATOR CUFF REPAIR Right 09/01/2013   Procedure: RIGHT SHOULDER MINI OPEN ROTATOR CUFF REPAIR WITH SUBACROMIAL DECOMPRESSION;  Surgeon: [name], MD;  Location: [facility];  Service: Orthopedics;  Laterality: Right;  . TONSILLECTOMY     SURGERY AS A CHILD    There were no vitals filed for this visit.  Subjective Assessment - 07/28/18 0946    Subjective   S: I worked it a lot yesterday.     Currently in Pain?  No/denies         [facility] OT Assessment - 07/28/18 0946      Assessment   Medical Diagnosis  S/P left CMC athroplasty      Precautions   Precautions  Other (comment)    Precaution Comments  Follow [place] protocol: Gentle ROM of thumb and wrist, Week 8-10 (12/11-12/25): light strengthening.  ok to progress to soft CMC brace at 10 weeks. No precautions at 12 weeks post op.    Required Braces or Orthoses  Other Brace/Splint    Other Brace/Splint  left thumb spica splint               OT Treatments/Exercises (OP) - 07/28/18 1008      Exercises   Exercises  Hand;Wrist  Weighted Stretch Over Towel Roll   Wrist Flexion - Weighted Stretch  1 pound;60 seconds    Wrist Extension - Weighted Stretch  1 pound;60 seconds      Wrist Exercises   Wrist Flexion  PROM;5 reps;Strengthening;10 reps    Bar Weights/Barbell (Wrist Flexion)  1 lb    Wrist Extension  PROM;5 reps;Strengthening;10 reps    Bar Weights/Barbell (Wrist Extension)  1 lb    Wrist Radial Deviation  PROM;5 reps;Strengthening;10 reps    Bar Weights/Barbell (Radial Deviation)  1 lb    Wrist Ulnar Deviation  PROM;5 reps;Strengthening;10 reps    Bar Weights/Barbell (Ulnar Deviation)  1 lb      Additional Wrist Exercises   Sponges  Pt used red clothespin and 3 point pinch to stack 4 piles of 5 sponges, min difficulty and fatigue during task. Pt then used lateral pinch to place 20 sponges into bucket with min difficulty     Hand Gripper  with Medium Beads  all beads gripper at 35#      Hand Exercises   MCPJ Flexion  PROM;10 reps    MCPJ Extension  PROM;10 reps    Thumb Opposition  Working to reach base of 5th digit MCP joint. Pt lacking approximately 3-4cm from touching base. 10X      Manual Therapy   Manual Therapy  Edema management;Myofascial release    Manual therapy comments  manual therapy completed prior to exercises    Edema Management  Edema management techniques completed to left thumb, wrist, and forearm to decrease swelling and increase joint mobility.     Myofascial Release  Myofascial release and manual stretching completed to left thumb, wrist, and forearm region to decrease fascial restrictions and increase joint mobility in a pain free zone.                OT Short Term Goals - 07/19/18 1456      OT SHORT TERM GOAL #1   Title  Patient will be educated and independent with his HEP in order to facilitate his progress in therapy and increase the use of his left hand with simple daily tasks.     Time  6    Period  Weeks    Status  On-going      OT SHORT TERM GOAL #2   Title  Patient will increase his fine motor coordination by completing the 9 hole peg test in 40 seconds or less without need for peg setup.     Time  6    Period  Weeks    Status  On-going      OT SHORT TERM GOAL #3   Title  Patient will increase his grip strength by 10# and his pinch streny by 5# in order to be able to hold onto lightweight items at home.     Time  6    Period  Weeks    Status  On-going      OT SHORT TERM GOAL #4   Title  Patient will report a pain level of 5/10 or less when using his left hand for daily tasks.     Time  6    Period  Weeks    Status  On-going      OT SHORT TERM GOAL #5   Title  patient will increase his left hand and wrist A/ROM to [facility] in order to be able to grasp items such as the steering wheel and phone with his left hand.  Time  6    Period  Weeks    Status  On-going         OT Long Term Goals - 07/19/18 1456      OT LONG TERM GOAL #1   Title  Patient will return to highest level of independence while using his left hand for all daily tasks 75% of the time or more.     Time  12    Period  Weeks    Status  On-going      OT LONG TERM GOAL #2   Title  Patient will increase fine motor coordination by completing the 9 hole peg test in 30 seconds or less in order to return to completing simple household tasks.     Time  12    Period  Weeks    Status  On-going      OT LONG TERM GOAL #3   Title  Patient will increase left hand grip strength by 15# and pinch strength by 10# in order to hold tools and items of weight without difficulty.     Time  12    Period  Weeks    Status  On-going      OT LONG TERM GOAL #4   Title  Patient will increase left wrist strength to 4+/5 in order to utilize his left hand to push up from a chair and hold items of 5# or greater.     Time  12    Period  Weeks    Status  On-going      OT LONG TERM GOAL #5   Title  Patient will decrease fascial restrictions to trace amount in his left hand in order to increase the functional mobility needed to complete daily tasks.     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 Per protocol progressed to gentle strengthening today, adding 1# to wrist exercises and initiating hand gripper tasks and pinch tasks. Pt with moderate difficulty with gripper, unable to grip large beads due to too much power on one setting but did not have enough strength to increase gripper resistance. Verbal cuing for technique during exercises.     Plan  P: Continue with passive stretching working to improve ROM. Continue with grip and pinch strengthening       Patient will benefit from skilled therapeutic intervention in order to improve the following  deficits and impairments:  Decreased scar mobility, Decreased strength, Decreased range of motion, Increased edema, Pain, Impaired UE functional use, Increased fascial restrictions, Decreased coordination  Visit Diagnosis: Other symptoms and signs involving the musculoskeletal system  Stiffness of left wrist, not elsewhere classified  Pain in left wrist    Problem List Patient Active Problem List   Diagnosis Date Noted  . Hemorrhoids 02/08/2018  . Constipation 11/02/2017  . Taking medication for chronic disease 11/02/2017  . Right rotator cuff tear 09/01/2013  . Rotator cuff tear, right 09/01/2013   Guadelupe Sabin, OTR/L  [phone] 07/28/2018, 12:08 PM  [place] [address] [place], [place], [address] Phone: [phone]   Fax:  [phone]  Name: Estel Scholze MRN: [id number] Date of Birth: 1951-02-20

## 2018-08-01 DIAGNOSIS — G43909 Migraine, unspecified, not intractable, without status migrainosus: Secondary | ICD-10-CM | POA: Diagnosis not present

## 2018-08-01 DIAGNOSIS — J060 Acute laryngopharyngitis: Secondary | ICD-10-CM | POA: Diagnosis not present

## 2018-08-02 ENCOUNTER — Encounter (HOSPITAL_COMMUNITY): Payer: Self-pay | Admitting: Occupational Therapy

## 2018-08-02 ENCOUNTER — Ambulatory Visit (HOSPITAL_COMMUNITY): Payer: PPO | Admitting: Occupational Therapy

## 2018-08-02 DIAGNOSIS — R29898 Other symptoms and signs involving the musculoskeletal system: Secondary | ICD-10-CM | POA: Diagnosis not present

## 2018-08-02 DIAGNOSIS — M25532 Pain in left wrist: Secondary | ICD-10-CM

## 2018-08-02 DIAGNOSIS — M25632 Stiffness of left wrist, not elsewhere classified: Secondary | ICD-10-CM

## 2018-08-02 NOTE — Therapy (Signed)
[place], [place], [address] Phone: [phone]   Fax:  [phone]  Occupational Therapy Treatment  Patient Details  Name: [name] MRN: [id number] Date of Birth: Apr 16, 1951 Referring Provider (OT): Dr. Iran Planas   Encounter Date: 08/02/2018  OT End of Session - 08/02/18 1109    Visit Number  5    Number of Visits  24    Date for OT Re-Evaluation  10/05/18   mini reassess: 08/10/18   Authorization Type  [employer] $15 copay    Authorization Time Period  no visit limit    OT Start Time  1030    OT Stop Time  1112    OT Time Calculation (min)  42 min    Activity Tolerance  Patient tolerated treatment well    Behavior During Therapy  [facility] for tasks assessed/performed       Past Medical History:  Diagnosis Date  . Anemia    COUPLE OF YRS AGO- NO PROBLEMS SINCE  . Complication of anesthesia    REMEMBERS [place] [facility]  . GERD (gastroesophageal reflux disease)    RARE   . Hyperlipidemia   . Hypertension   . Pain    RIGHT SHOULDER - ROTATOR CUFF TEAR    Past Surgical History:  Procedure Laterality Date  . BACK SURGERY  2006   LOWER BACK FUSION - SCREWS AND RODS  . BACK SURGERY  06/2017  . CATARACT EXTRACTION W/PHACO Right 11/26/2015   Procedure: CATARACT EXTRACTION PHACO AND INTRAOCULAR LENS PLACEMENT (IOC);  Surgeon: [name], MD;  Location: [facility];  Service: Ophthalmology;  Laterality: Right;  CDE:6.97  . CATARACT EXTRACTION W/PHACO Left 12/10/2015   Procedure: CATARACT EXTRACTION PHACO AND INTRAOCULAR LENS PLACEMENT (IOC);  Surgeon: [name], MD;  Location: [facility];  Service: Ophthalmology;  Laterality: Left;  CDE:4.25  . CHOLECYSTECTOMY    . COLONOSCOPY WITH PROPOFOL N/A 12/02/2017   Procedure: COLONOSCOPY WITH PROPOFOL;  Surgeon: Daneil Dolin, MD;  [facility];  Service: Endoscopy;  Laterality: N/A;  8:30am  . FINGERNAIL REMOVED RT INDEX FINGER  - FOR CYST THAT WAS BEHIND THE NAIL    . SHOULDER OPEN ROTATOR CUFF REPAIR Right 09/01/2013   Procedure: RIGHT SHOULDER MINI OPEN ROTATOR CUFF REPAIR WITH SUBACROMIAL DECOMPRESSION;  Surgeon: [name], MD;  [facility];  Service: Orthopedics;  Laterality: Right;  . TONSILLECTOMY     SURGERY AS A CHILD    There were no vitals filed for this visit.  Subjective Assessment - 08/02/18 1029    Subjective   S: I'm mostly feeling tightness today.     Currently in Pain?  No/denies         [facility] OT Assessment - 08/02/18 1027      Assessment   Medical Diagnosis  S/P left CMC athroplasty      Precautions   Precautions  Other (comment)    Precaution Comments  Follow [place] protocol: Gentle ROM of thumb and wrist, Week 8-10 (12/11-12/25): light strengthening.  ok to progress to soft CMC brace at 10 weeks. No precautions at 12 weeks post op.               OT Treatments/Exercises (OP) - 08/02/18 1027      Exercises   Exercises  Hand;Wrist;Theraputty      Weighted Stretch Over Towel Roll   Wrist Flexion - Weighted Stretch  2 pounds;60 seconds  Wrist Extension - Weighted Stretch  2 pounds;60 seconds      Wrist Exercises   Wrist Flexion  PROM;5 reps;Strengthening;10 reps    Bar Weights/Barbell (Wrist Flexion)  1 lb    Wrist Extension  PROM;5 reps;Strengthening;10 reps    Bar Weights/Barbell (Wrist Extension)  1 lb    Wrist Radial Deviation  PROM;5 reps;Strengthening;10 reps    Bar Weights/Barbell (Radial Deviation)  1 lb    Wrist Ulnar Deviation  PROM;5 reps;Strengthening;10 reps    Bar Weights/Barbell (Ulnar Deviation)  1 lb    Other wrist exercises  Pt used pvc pipe to cut circles into red theraputty working on wrist flexion/extension, and grip strength      Additional Wrist Exercises   Hand Gripper with Large Beads  all beads gripper at 35#    Hand Gripper with Medium Beads  all beads gripper at 35#    Hand Gripper with Small Beads  all beads  gripper at 25#   horizontal     Hand Exercises   Thumb Opposition  Working to reach base of 5th digit MCP joint. Pt lacking approximately 3-4cm from touching base. 10X. P/ROM 10X, reaching pad of finger without pain      Theraputty   Theraputty - Flatten  red-standing working on wrist extension      Manual Therapy   Manual Therapy  Myofascial release    Manual therapy comments  manual therapy completed prior to exercises    Myofascial Release  Myofascial release and manual stretching completed to left thumb, wrist, and forearm region to decrease fascial restrictions and increase joint mobility in a pain free zone.                OT Short Term Goals - 07/19/18 1456      OT SHORT TERM GOAL #1   Title  Patient will be educated and independent with his HEP in order to facilitate his progress in therapy and increase the use of his left hand with simple daily tasks.     Time  6    Period  Weeks    Status  On-going      OT SHORT TERM GOAL #2   Title  Patient will increase his fine motor coordination by completing the 9 hole peg test in 40 seconds or less without need for peg setup.     Time  6    Period  Weeks    Status  On-going      OT SHORT TERM GOAL #3   Title  Patient will increase his grip strength by 10# and his pinch streny by 5# in order to be able to hold onto lightweight items at home.     Time  6    Period  Weeks    Status  On-going      OT SHORT TERM GOAL #4   Title  Patient will report a pain level of 5/10 or less when using his left hand for daily tasks.     Time  6    Period  Weeks    Status  On-going      OT SHORT TERM GOAL #5   Title  patient will increase his left hand and wrist A/ROM to [facility] in order to be able to grasp items such as the steering wheel and phone with his left hand.     Time  6    Period  Weeks    Status  On-going  OT Long Term Goals - 07/19/18 1456      OT LONG TERM GOAL #1   Title  Patient will return to highest level  of independence while using his left hand for all daily tasks 75% of the time or more.     Time  12    Period  Weeks    Status  On-going      OT LONG TERM GOAL #2   Title  Patient will increase fine motor coordination by completing the 9 hole peg test in 30 seconds or less in order to return to completing simple household tasks.     Time  12    Period  Weeks    Status  On-going      OT LONG TERM GOAL #3   Title  Patient will increase left hand grip strength by 15# and pinch strength by 10# in order to hold tools and items of weight without difficulty.     Time  12    Period  Weeks    Status  On-going      OT LONG TERM GOAL #4   Title  Patient will increase left wrist strength to 4+/5 in order to utilize his left hand to push up from a chair and hold items of 5# or greater.     Time  12    Period  Weeks    Status  On-going      OT LONG TERM GOAL #5   Title  Patient will decrease fascial restrictions to trace amount in his left hand in order to increase the functional mobility needed to complete daily tasks.     Time  12    Period  Weeks    Status  On-going            Plan - 08/02/18 1106    Clinical Impression Statement  A: Continued with gentle strengthening this session, pt able to increase weighted wrist stretch to 2#. Added large and small beads to hand gripper task, also adding red theraputty activity targeting wrist ROM. Mod difficulty with flattening putty due to discomfort during wrist extension. Verbal cuing for form and technique.     Plan  P: Continue working on improving ROM within pain tolerance, resume pinch strengthening task       Patient will benefit from skilled therapeutic intervention in order to improve the following deficits and impairments:  Decreased scar mobility, Decreased strength, Decreased range of motion, Increased edema, Pain, Impaired UE functional use, Increased fascial restrictions, Decreased coordination  Visit Diagnosis: Other symptoms  and signs involving the musculoskeletal system  Stiffness of left wrist, not elsewhere classified  Pain in left wrist    Problem List Patient Active Problem List   Diagnosis Date Noted  . Hemorrhoids 02/08/2018  . Constipation 11/02/2017  . Taking medication for chronic disease 11/02/2017  . Right rotator cuff tear 09/01/2013  . Rotator cuff tear, right 09/01/2013   Guadelupe Sabin, OTR/L  [phone] 08/02/2018, 11:13 AM  [place], [place], [address] Phone: [phone]   Fax:  [phone]  Name: Alaa Eyerman MRN: [id number] Date of Birth: 1951-01-26

## 2018-08-04 ENCOUNTER — Encounter (HOSPITAL_COMMUNITY): Payer: Self-pay

## 2018-08-04 ENCOUNTER — Ambulatory Visit (HOSPITAL_COMMUNITY): Payer: PPO

## 2018-08-04 DIAGNOSIS — R278 Other lack of coordination: Secondary | ICD-10-CM

## 2018-08-04 DIAGNOSIS — M25532 Pain in left wrist: Secondary | ICD-10-CM

## 2018-08-04 DIAGNOSIS — R29898 Other symptoms and signs involving the musculoskeletal system: Secondary | ICD-10-CM | POA: Diagnosis not present

## 2018-08-04 DIAGNOSIS — M25632 Stiffness of left wrist, not elsewhere classified: Secondary | ICD-10-CM

## 2018-08-04 NOTE — Therapy (Signed)
[place], [place], [address] Phone: [phone]   Fax:  [phone]  Occupational Therapy Treatment  Patient Details  Name: [name] MRN: [id number] Date of Birth: [birth year] Referring Provider (OT): Dr. Iran Planas   Encounter Date: 08/04/2018  OT End of Session - 08/04/18 1113    Visit Number  6    Number of Visits  24    Date for OT Re-Evaluation  10/05/18   mini reassess: 08/10/18   Authorization Type  [employer] $15 copay    Authorization Time Period  no visit limit    OT Start Time  1035    OT Stop Time  1115    OT Time Calculation (min)  40 min    Activity Tolerance  Patient tolerated treatment well    Behavior During Therapy  [facility] for tasks assessed/performed       Past Medical History:  Diagnosis Date  . Anemia    COUPLE OF YRS AGO- NO PROBLEMS SINCE  . Complication of anesthesia    REMEMBERS [place] UP  . GERD (gastroesophageal reflux disease)    RARE   . Hyperlipidemia   . Hypertension   . Pain    RIGHT SHOULDER - ROTATOR CUFF TEAR    Past Surgical History:  Procedure Laterality Date  . BACK SURGERY  2006   LOWER BACK FUSION - SCREWS AND RODS  . BACK SURGERY  06/2017  . CATARACT EXTRACTION W/PHACO Right 11/26/2015   Procedure: CATARACT EXTRACTION PHACO AND INTRAOCULAR LENS PLACEMENT (IOC);  Surgeon: [name], MD;  Location: [facility];  Service: Ophthalmology;  Laterality: Right;  CDE:6.97  . CATARACT EXTRACTION W/PHACO Left 12/10/2015   Procedure: CATARACT EXTRACTION PHACO AND INTRAOCULAR LENS PLACEMENT (IOC);  Surgeon: [name], MD;  Location: [facility];  Service: Ophthalmology;  Laterality: Left;  CDE:4.25  . CHOLECYSTECTOMY    . COLONOSCOPY WITH PROPOFOL N/A 12/02/2017   Procedure: COLONOSCOPY WITH PROPOFOL;  Surgeon: Daneil Dolin, MD;  [facility]: [facility];  Service: Endoscopy;  Laterality: N/A;  8:30am  . FINGERNAIL REMOVED RT INDEX FINGER  - FOR CYST THAT WAS BEHIND THE NAIL    . SHOULDER OPEN ROTATOR CUFF REPAIR Right 09/01/2013   Procedure: RIGHT SHOULDER MINI OPEN ROTATOR CUFF REPAIR WITH SUBACROMIAL DECOMPRESSION;  Surgeon: [name], MD;  Location: [facility];  Service: Orthopedics;  Laterality: Right;  . TONSILLECTOMY     SURGERY AS A CHILD    There were no vitals filed for this visit.  Subjective Assessment - 08/04/18 1055    Subjective   S: It just feels stiff. I have a new note in the palm of my hand here.     Currently in Pain?  No/denies         [facility] OT Assessment - 08/04/18 1056      Assessment   Medical Diagnosis  S/P left CMC athroplasty      Precautions   Precautions  Other (comment)    Precaution Comments  Follow [facility] protocol: Gentle ROM of thumb and wrist, Week 8-10 (12/11-12/25): light strengthening.  ok to progress to soft CMC brace at 10 weeks. No precautions at 12 weeks post op.               OT Treatments/Exercises (OP) - 08/04/18 1056      Exercises   Exercises  Hand;Wrist;Theraputty      Wrist Exercises   Wrist Flexion  PROM;5 reps;Strengthening;10 reps    Bar Weights/Barbell (Wrist Flexion)  1 lb    Wrist Extension  PROM;5 reps;Strengthening;10 reps    Bar Weights/Barbell (Wrist Extension)  1 lb    Wrist Radial Deviation  PROM;5 reps;Strengthening;10 reps    Bar Weights/Barbell (Radial Deviation)  1 lb    Wrist Ulnar Deviation  PROM;5 reps;Strengthening;10 reps    Bar Weights/Barbell (Ulnar Deviation)  1 lb      Additional Wrist Exercises   Sponges  pt utilized red clothespin and a 3 point pinch to pick up 30 sponges and place in container.       Theraputty   Theraputty - Flatten  red-standing working on wrist extension    Theraputty - Roll  red    Theraputty - Grip  red- supinated and prontated      Manual Therapy   Manual Therapy  Myofascial release;Muscle Energy Technique    Manual therapy comments  manual therapy completed prior to exercises     Myofascial Release  Myofascial release and manual stretching completed to left thumb, wrist, and forearm region to decrease fascial restrictions and increase joint mobility in a pain free zone.     Muscle Energy Technique  Muscle energy technique completed to wrist flexors and extensors to decrease tone and increase joint mobility.                OT Short Term Goals - 07/19/18 1456      OT SHORT TERM GOAL #1   Title  Patient will be educated and independent with his HEP in order to facilitate his progress in therapy and increase the use of his left hand with simple daily tasks.     Time  6    Period  Weeks    Status  On-going      OT SHORT TERM GOAL #2   Title  Patient will increase his fine motor coordination by completing the 9 hole peg test in 40 seconds or less without need for peg setup.     Time  6    Period  Weeks    Status  On-going      OT SHORT TERM GOAL #3   Title  Patient will increase his grip strength by 10# and his pinch streny by 5# in order to be able to hold onto lightweight items at home.     Time  6    Period  Weeks    Status  On-going      OT SHORT TERM GOAL #4   Title  Patient will report a pain level of 5/10 or less when using his left hand for daily tasks.     Time  6    Period  Weeks    Status  On-going      OT SHORT TERM GOAL #5   Title  patient will increase his left hand and wrist A/ROM to [facility] in order to be able to grasp items such as the steering wheel and phone with his left hand.     Time  6    Period  Weeks    Status  On-going        OT Long Term Goals - 07/19/18 1456      OT LONG TERM GOAL #1   Title  Patient will return to highest level of independence while using his left hand for all daily tasks 75% of the time or more.     Time  12  Period  Weeks    Status  On-going      OT LONG TERM GOAL #2   Title  Patient will increase fine motor coordination by completing the 9 hole peg test in 30 seconds or less in order to return  to completing simple household tasks.     Time  12    Period  Weeks    Status  On-going      OT LONG TERM GOAL #3   Title  Patient will increase left hand grip strength by 15# and pinch strength by 10# in order to hold tools and items of weight without difficulty.     Time  12    Period  Weeks    Status  On-going      OT LONG TERM GOAL #4   Title  Patient will increase left wrist strength to 4+/5 in order to utilize his left hand to push up from a chair and hold items of 5# or greater.     Time  12    Period  Weeks    Status  On-going      OT LONG TERM GOAL #5   Title  Patient will decrease fascial restrictions to trace amount in his left hand in order to increase the functional mobility needed to complete daily tasks.     Time  12    Period  Weeks    Status  On-going            Plan - 08/04/18 1114    Clinical Impression Statement  A: Pt reports concern over small fascial restrictions in palm of hand distal to 4th MCP joint. Will continue to monitor. Completed grip and pinch strengthening using red theraputty. Rest breaks taken as needed due to fatigue and pain level. VC for form and technique as needed.     Plan  P: Mini reassessment    Consulted and Agree with Plan of Care  Patient       Patient will benefit from skilled therapeutic intervention in order to improve the following deficits and impairments:  Decreased scar mobility, Decreased strength, Decreased range of motion, Increased edema, Pain, Impaired UE functional use, Increased fascial restrictions, Decreased coordination  Visit Diagnosis: Stiffness of left wrist, not elsewhere classified  Other symptoms and signs involving the musculoskeletal system  Pain in left wrist  Other lack of coordination    Problem List Patient Active Problem List   Diagnosis Date Noted  . Hemorrhoids 02/08/2018  . Constipation 11/02/2017  . Taking medication for chronic disease 11/02/2017  . Right rotator cuff tear  09/01/2013  . Rotator cuff tear, right 09/01/2013   Ailene Ravel, OTR/L,CBIS  [phone]  08/04/2018, 11:17 AM  [place], [place], [address] Phone: [phone]   Fax:  [phone]  Name: [name] MRN: [id number] Date of Birth: 07-24-1951

## 2018-08-08 ENCOUNTER — Ambulatory Visit (HOSPITAL_COMMUNITY): Payer: PPO

## 2018-08-08 ENCOUNTER — Telehealth (HOSPITAL_COMMUNITY): Payer: Self-pay

## 2018-08-08 DIAGNOSIS — I10 Essential (primary) hypertension: Secondary | ICD-10-CM | POA: Diagnosis not present

## 2018-08-08 DIAGNOSIS — E782 Mixed hyperlipidemia: Secondary | ICD-10-CM | POA: Diagnosis not present

## 2018-08-08 NOTE — Telephone Encounter (Signed)
He is not feeling well and ca'nt make it

## 2018-08-12 ENCOUNTER — Encounter (HOSPITAL_COMMUNITY): Payer: PPO

## 2018-08-13 DIAGNOSIS — Z8679 Personal history of other diseases of the circulatory system: Secondary | ICD-10-CM | POA: Diagnosis not present

## 2018-08-13 DIAGNOSIS — J22 Unspecified acute lower respiratory infection: Secondary | ICD-10-CM | POA: Diagnosis not present

## 2018-08-15 ENCOUNTER — Ambulatory Visit: Payer: PPO | Admitting: Nurse Practitioner

## 2018-08-16 ENCOUNTER — Ambulatory Visit (HOSPITAL_COMMUNITY): Payer: PPO

## 2018-08-18 ENCOUNTER — Encounter (HOSPITAL_COMMUNITY): Payer: PPO | Admitting: Occupational Therapy

## 2018-08-22 ENCOUNTER — Ambulatory Visit (HOSPITAL_COMMUNITY): Payer: PPO | Attending: Orthopedic Surgery | Admitting: Occupational Therapy

## 2018-08-22 ENCOUNTER — Encounter (HOSPITAL_COMMUNITY): Payer: Self-pay | Admitting: Occupational Therapy

## 2018-08-22 DIAGNOSIS — M25632 Stiffness of left wrist, not elsewhere classified: Secondary | ICD-10-CM | POA: Insufficient documentation

## 2018-08-22 DIAGNOSIS — R29898 Other symptoms and signs involving the musculoskeletal system: Secondary | ICD-10-CM | POA: Diagnosis not present

## 2018-08-22 NOTE — Therapy (Signed)
[place], [place], [address] Phone: [phone]   Fax:  [phone]  Occupational Therapy Reassessment, Treatment, Discharge Summary   Patient Details  Name: [name] MRN: [id number] Date of Birth: 1950-10-11 Referring Provider (OT): Dr. Iran Planas  Progress Note Reporting Period 07/13/2018  to 08/22/2018  See note below for Objective Data and Assessment of Progress/Goals.       Encounter Date: 08/22/2018  OT End of Session - 08/22/18 1028    Visit Number  7    Number of Visits  24    Date for OT Re-Evaluation  10/05/18   mini reassess: 08/10/18   Authorization Type  [employer] $15 copay    Authorization Time Period  no visit limit    OT Start Time  0948    OT Stop Time  1016    OT Time Calculation (min)  28 min    Activity Tolerance  Patient tolerated treatment well    Behavior During Therapy  [facility] for tasks assessed/performed       Past Medical History:  Diagnosis Date  . Anemia    COUPLE OF YRS AGO- NO PROBLEMS SINCE  . Complication of anesthesia    REMEMBERS [place] UP  . GERD (gastroesophageal reflux disease)    RARE   . Hyperlipidemia   . Hypertension   . Pain    RIGHT SHOULDER - ROTATOR CUFF TEAR    Past Surgical History:  Procedure Laterality Date  . BACK SURGERY  2006   LOWER BACK FUSION - SCREWS AND RODS  . BACK SURGERY  06/2017  . CATARACT EXTRACTION W/PHACO Right 11/26/2015   Procedure: CATARACT EXTRACTION PHACO AND INTRAOCULAR LENS PLACEMENT (IOC);  Surgeon: [name], MD;  Location: [facility];  Service: Ophthalmology;  Laterality: Right;  CDE:6.97  . CATARACT EXTRACTION W/PHACO Left 12/10/2015   Procedure: CATARACT EXTRACTION PHACO AND INTRAOCULAR LENS PLACEMENT (IOC);  Surgeon: [name], MD;  Location: [facility];  Service: Ophthalmology;  Laterality: Left;  CDE:4.25  . CHOLECYSTECTOMY    . COLONOSCOPY WITH PROPOFOL N/A 12/02/2017   Procedure:  COLONOSCOPY WITH PROPOFOL;  Surgeon: Daneil Dolin, MD;  [facility];  Service: Endoscopy;  Laterality: N/A;  8:30am  . FINGERNAIL REMOVED RT INDEX FINGER - FOR CYST THAT WAS BEHIND THE NAIL    . SHOULDER OPEN ROTATOR CUFF REPAIR Right 09/01/2013   Procedure: RIGHT SHOULDER MINI OPEN ROTATOR CUFF REPAIR WITH SUBACROMIAL DECOMPRESSION;  Surgeon: [name], MD;  Location[facility];  Service: Orthopedics;  Laterality: Right;  . TONSILLECTOMY     SURGERY AS A CHILD    There were no vitals filed for this visit.  Subjective Assessment - 08/22/18 0947    Subjective   S: I'm doing better I think.     Currently in Pain?  No/denies         [facility] OT Assessment - 08/22/18 0946      Assessment   Medical Diagnosis  S/P left CMC athroplasty      Precautions   Precautions  Other (comment)    Precaution Comments  Follow [facility] protocol: Gentle ROM of thumb and wrist, Week 8-10 (12/11-12/25): light strengthening.  ok to progress to soft CMC brace at 10 weeks. No precautions at 12 weeks post op.      Coordination   Left 9 Hole Peg Test  22.07"   50.0" previous     AROM  Left Wrist Extension  68 Degrees   42 previous   Left Wrist Flexion  50 Degrees   30 previous   Left Wrist Radial Deviation  20 Degrees   2 previous   Left Wrist Ulnar Deviation  30 Degrees   26 previous     PROM   Left Wrist Extension  72 Degrees   44 previous   Left Wrist Flexion  52 Degrees   46 previous   Left Wrist Radial Deviation  25 Degrees   8 previous   Left Wrist Ulnar Deviation  32 Degrees   same as previous     Strength   Left Wrist Flexion  4-/5   3-/5 previous   Left Wrist Extension  4+/5   3-/5 previous   Left Wrist Radial Deviation  4/5   3-/5 previous   Left Wrist Ulnar Deviation  5/5   3-/5 previous   Left Hand Grip (lbs)  68   19 previous   Left Hand Lateral Pinch  12 lbs   0 previous   Left Hand 3 Point Pinch  11 lbs   0 previous               OT Treatments/Exercises (OP) - 08/22/18 1025      ADLs   ADL Comments  Educated pt on use of hand/wrist/thumb during ADLs. 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 Pt with difficulty during twisting, pulling, rotational movements. Discussed various tasks for practice including tool use, carrying tasks, etc. Pt verbalized understanding. Also discussed and educated on stiffness experienced in the mornings and improvement with ROM and use. Pt verbalized understanding.              OT Education - 08/22/18 1019    Education Details  wrist strengthening, updated theraputty to green, thumb mobility exercises    Person(s) Educated  Patient    Methods  Explanation;Demonstration;Handout    Comprehension  Verbalized understanding;Returned demonstration       OT Short Term Goals - 08/22/18 1028      OT SHORT TERM GOAL #1   Title  Patient will be educated and independent with his HEP in order to facilitate his progress in therapy and increase the use of his left hand with simple daily tasks.     Time  6    Period  Weeks    Status  Achieved      OT SHORT TERM GOAL #2   Title  Patient will increase his fine motor coordination by completing the 9 hole peg test in 40 seconds or less without need for peg setup.     Time  6    Period  Weeks    Status  Achieved      OT SHORT TERM GOAL #3   Title  Patient will increase his grip strength by 10# and his pinch streny by 5# in order to be able to hold onto lightweight items at home.     Time  6    Period  Weeks    Status  Achieved      OT SHORT TERM GOAL #4   Title  Patient will report a pain level of 5/10 or less when using his left hand for daily tasks.     Time  6    Period  Weeks    Status  Achieved      OT SHORT TERM GOAL #5   Title  patient will increase his  left hand and wrist A/ROM to [facility] in order to be able to grasp items such as the steering wheel and phone with his left hand.      Time  6    Period  Weeks    Status  Achieved        OT Long Term Goals - 08/22/18 1028      OT LONG TERM GOAL #1   Title  Patient will return to highest level of independence while using his left hand for all daily tasks 75% of the time or more.     Time  12    Period  Weeks    Status  Achieved      OT LONG TERM GOAL #2   Title  Patient will increase fine motor coordination by completing the 9 hole peg test in 30 seconds or less in order to return to completing simple household tasks.     Time  12    Period  Weeks    Status  Achieved      OT LONG TERM GOAL #3   Title  Patient will increase left hand grip strength by 15# and pinch strength by 10# in order to hold tools and items of weight without difficulty.     Time  12    Period  Weeks    Status  Achieved      OT LONG TERM GOAL #4   Title  Patient will increase left wrist strength to 4+/5 in order to utilize his left hand to push up from a chair and hold items of 5# or greater.     Time  12    Period  Weeks    Status  Achieved      OT LONG TERM GOAL #5   Title  Patient will decrease fascial restrictions to trace amount in his left hand in order to increase the functional mobility needed to complete daily tasks.     Time  12    Period  Weeks    Status  Not Met            Plan - 08/22/18 1029    Clinical Impression Statement  A: Reassessment completed this session, pt has met all goals with the exception of one LTG. Pt has made excellent progress during therapy and is now only experiencing minor soreness and stiffness in the mornings, which improves with use of hand. 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 Pt also has 2 small muscle knots in palm of hand causing some discomfort-pt will discuss with MD. Provided measurements for MD appt. Pt is agreeable to discharge with HEP for continued strengthening and improvement of thumb ROM.      Plan  P: Discharge pt.        Patient will benefit from skilled therapeutic intervention in order to improve the following deficits and impairments:  Decreased scar mobility, Decreased strength, Decreased range of motion, Increased edema, Pain, Impaired UE functional use, Increased fascial restrictions, Decreased coordination  Visit Diagnosis: Stiffness of left wrist, not elsewhere classified  Other symptoms and signs involving the musculoskeletal system    Problem List Patient Active Problem List   Diagnosis Date Noted  . Hemorrhoids 02/08/2018  . Constipation 11/02/2017  . Taking medication for chronic disease 11/02/2017  . Right rotator cuff tear 09/01/2013  . Rotator cuff tear, right 09/01/2013   Guadelupe Sabin, OTR/L  [phone] 08/22/2018,  10:54 AM  [place], [place], [address] Phone: [phone]   Fax:  [phone]  Name: [name] MRN: [id number] Date of Birth: 1950-09-16   OCCUPATIONAL THERAPY DISCHARGE SUMMARY  Visits from Start of Care: 7  Current functional level related to goals / functional outcomes: See above. 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 Pt has met all goals with exception of 1 LTG and is pleased with current functional level.    Remaining deficits: Occasional soreness and stiffness with rotational movements. Stiffness in the mornings.    Education / Equipment: HEP for strengthening Plan: Patient agrees to discharge.  Patient goals were met. Patient is being discharged due to meeting the stated rehab goals.  ?????

## 2018-08-22 NOTE — Patient Instructions (Signed)
Strengthening Exercises  1) WRIST EXTENSION CURLS - TABLE  Hold a small free weight, rest your forearm on a table and bend your wrist up and down with your palm face down as shown.      2) WRIST FLEXION CURLS - TABLE  Hold a small free weight, rest your forearm on a table and bend your wrist up and down with your palm face up as shown.     3) FREE WEIGHT RADIAL/ULNAR DEVIATION - TABLE  Hold a small free weight, rest your forearm on a table and bend your wrist up and down with your palm facing towards the side as shown.     4) Pronation  Forearm supported on table with wrist in neutral position. Using a weight, roll wrist so that palm faces downward. Hold for 2 seconds and return to starting position.     5) Supination  Forearm supported on table with wrist in neutral position. Using a weight, roll wrist so that palm is now facing upward. Hold for 2 seconds and return to starting position.      *Complete exercises using __5__ pound weight, __15__times each, __1-2__times per day*   Thumb Exercises:   1) Hold items in palm of hand (coins, nuts/bolts, pegs, etc.) and practice moving from palm to fingertips using thumb.   2) Practice trying to touch pad of pinky finger (near palm) with thumb.

## 2018-08-23 ENCOUNTER — Ambulatory Visit: Payer: PPO | Admitting: Neurology

## 2018-08-23 ENCOUNTER — Encounter: Payer: Self-pay | Admitting: *Deleted

## 2018-08-23 ENCOUNTER — Encounter: Payer: Self-pay | Admitting: Neurology

## 2018-08-23 ENCOUNTER — Telehealth: Payer: Self-pay | Admitting: Neurology

## 2018-08-23 VITALS — BP 131/84 | HR 68 | Ht 69.0 in | Wt 211.0 lb

## 2018-08-23 DIAGNOSIS — R0683 Snoring: Secondary | ICD-10-CM

## 2018-08-23 DIAGNOSIS — H5711 Ocular pain, right eye: Secondary | ICD-10-CM

## 2018-08-23 DIAGNOSIS — H539 Unspecified visual disturbance: Secondary | ICD-10-CM | POA: Diagnosis not present

## 2018-08-23 DIAGNOSIS — G441 Vascular headache, not elsewhere classified: Secondary | ICD-10-CM

## 2018-08-23 DIAGNOSIS — R519 Headache, unspecified: Secondary | ICD-10-CM | POA: Insufficient documentation

## 2018-08-23 DIAGNOSIS — R51 Headache with orthostatic component, not elsewhere classified: Secondary | ICD-10-CM

## 2018-08-23 DIAGNOSIS — M13842 Other specified arthritis, left hand: Secondary | ICD-10-CM | POA: Diagnosis not present

## 2018-08-23 DIAGNOSIS — H5461 Unqualified visual loss, right eye, normal vision left eye: Secondary | ICD-10-CM

## 2018-08-23 DIAGNOSIS — Z5189 Encounter for other specified aftercare: Secondary | ICD-10-CM | POA: Diagnosis not present

## 2018-08-23 DIAGNOSIS — M13841 Other specified arthritis, right hand: Secondary | ICD-10-CM | POA: Diagnosis not present

## 2018-08-23 MED ORDER — METHYLPREDNISOLONE 4 MG PO TBPK: ORAL_TABLET | ORAL | 0 refills | Status: DC

## 2018-08-23 NOTE — Progress Notes (Addendum)
Nerve block w/o steroid: Pt signed consent  0.5% Bupivocaine 1.5 mL LOT: [id number] EXP: 01/16/2020 NDC: 2878-6767-20  2% Lidocaine 1.5 mL LOT: [id number] EXP: 03/18/2019 NDC: 9470-9628-36  NO CHARGE for this nerve block

## 2018-08-23 NOTE — Progress Notes (Addendum)
[facility]    Provider:  Dr Jaynee Cantu Referring Provider: [name], MD Primary Care Physician:  Darrell Squibb, MD  CC:  [name]  HPI:  [name] is a 68 y.o. male here as requested by [name] for headache. Headache started 8 years ago. He has been to the [facility] and had a series of nerve blocks which hurt very much. His migraines went away but came back and he had the series a again a few years later. He tried Topamax and he became better and stopped for 2 years and then the headaches returned now behind the right eye. He has been treated for sinus infection. He ended up in [place] in the urgent care this past holiday in [place]. Mostly on the right behind the eye. It is pounding feeling. Mild light sensitivity. No nausea. No pain on movement. No dizziness. Right above the eyebrow. Hurts to touch it. The pain is continuous all day. Waxes ans wanes but can be very painful 8-9/10. Touching it makes it worse. Tylenol helps a little. No rashes or vision changes. This is similar to how headaches have been in the past except was worse in the past. +blurry vision right eye, + morning headache, + worsening with valsalva and supine. No other focal neurologic deficits, associated symptoms, inciting events or modifiable factors.  Reviewed notes, labs and imaging from outside physicians, which showed:  MRI 2010 personally reviewed imaging and agree: 1.  No evidence of acute ischemia. 2.  Non specific subcortical white matter changes supratentorially. Differential considerations are changes related to the ischemic gliosis due to small vessel disease due to hypertension and/or diabetes versus a demyelinating process or vasculitis. 3.  Moderate pansinusitis , with a probable mucous retention cyst in the right maxillary sinus.  Review of Systems: Patient complains of symptoms per HPI as well as the following symptoms: blurred vision, SOB, headache, snoring.  Pertinent negatives and positives per HPI. All others negative.   Social History   Socioeconomic History  . Marital status: Married    Spouse name: Not on file  . Number of children: 3  . Years of education: 40  . Highest education level: High school graduate  Occupational History  . Not on file  Social Needs  . Financial resource strain: Not on file  . Food insecurity:    Worry: Not on file    Inability: Not on file  . Transportation needs:    Medical: Not on file    Non-medical: Not on file  Tobacco Use  . Smoking status: Never Smoker  . Smokeless tobacco: Former Systems developer    Types: Snuff  Substance and Sexual Activity  . Alcohol use: Yes    Comment: may have a beer or glass of whiskey occasionally  . Drug use: No  . Sexual activity: Yes    Birth control/protection: None  Lifestyle  . Physical activity:    Days per week: Not on file    Minutes per session: Not on file  . Stress: Not on file  Relationships  . Social connections:    Talks on phone: Not on file    Gets together: Not on file    Attends religious service: Not on file    Active member of club or organization: Not on file    Attends meetings of clubs or organizations: Not on file    Relationship status: Not on file  . Intimate partner violence:    Fear of current or  ex partner: Not on file    Emotionally abused: Not on file    Physically abused: Not on file    Forced sexual activity: Not on file  Other Topics Concern  . Not on file  Social History Narrative   Lives at home with his wife [name]   Right handed   Caffeine: 2 cups daily, coffee. Maybe 1 soda a day.    Family History  Problem Relation Age of Onset  . Hypertension Mother   . Cantu failure Mother   . Diabetes Mother   . High blood pressure Father   . High blood pressure Sister   . Cancer Brother        brain tumor  . High blood pressure Brother   . Other Other   . High blood pressure Brother   . Migraines Daughter   .  Migraines Grandson   . Migraines Granddaughter   . Migraines Granddaughter   . Colon cancer Neg Hx     Past Medical History:  Diagnosis Date  . Anemia    COUPLE OF YRS AGO- NO PROBLEMS SINCE  . Complication of anesthesia    REMEMBERS [place] UP  . GERD (gastroesophageal reflux disease)    RARE   . Hyperlipidemia   . Hypertension   . Migraine   . Pain    RIGHT SHOULDER - ROTATOR CUFF TEAR; denies further pain 08/23/2018.    Past Surgical History:  Procedure Laterality Date  . BACK SURGERY  2006   LOWER BACK FUSION - SCREWS AND RODS  . BACK SURGERY  06/2017  . CATARACT EXTRACTION W/PHACO Right 11/26/2015   Procedure: CATARACT EXTRACTION PHACO AND INTRAOCULAR LENS PLACEMENT (IOC);  Surgeon: Darrell Guys, MD;  Location: [facility];  Service: Ophthalmology;  Laterality: Right;  CDE:6.97  . CATARACT EXTRACTION W/PHACO Left 12/10/2015   Procedure: CATARACT EXTRACTION PHACO AND INTRAOCULAR LENS PLACEMENT (IOC);  Surgeon: Darrell Guys, MD;  Location: [facility];  Service: Ophthalmology;  Laterality: Left;  CDE:4.25  . CHOLECYSTECTOMY    . COLONOSCOPY WITH PROPOFOL N/A 12/02/2017   Procedure: COLONOSCOPY WITH PROPOFOL;  Surgeon: Darrell Dolin, MD;  Location: [facility];  Service: Endoscopy;  Laterality: N/A;  8:30am  . FINGERNAIL REMOVED RT INDEX FINGER - FOR CYST THAT WAS BEHIND THE NAIL    . left thumb surgery Left 06/2018   Darrell. Caralyn Cantu  . LUMBAR SPINE SURGERY  08/11/2017   disc repair, Darrell. Lavella Cantu  . SHOULDER OPEN ROTATOR CUFF REPAIR Right 09/01/2013   Procedure: RIGHT SHOULDER MINI OPEN ROTATOR CUFF REPAIR WITH SUBACROMIAL DECOMPRESSION;  Surgeon: Darrell Hai, MD;  Location: [facility];  Service: Orthopedics;  Laterality: Right;  . TONSILLECTOMY     SURGERY AS A CHILD    Current Outpatient Medications  Medication Sig Dispense Refill  . aspirin EC 81 MG tablet Take 81 mg by mouth daily.    Marland Kitchen atorvastatin (LIPITOR) 20 MG tablet Take 20 mg by mouth daily with  breakfast.     . docusate sodium (COLACE) 100 MG capsule Take 100 mg by mouth daily.    Marland Kitchen losartan-hydrochlorothiazide (HYZAAR) 100-25 MG tablet Take 1 tablet by mouth daily.  1  . Omega-3 Fatty Acids (FISH OIL) 1200 MG CAPS Take 2,400 mg by mouth daily.     . pantoprazole (PROTONIX) 40 MG tablet Take 40 mg by mouth every other day.     . valsartan (DIOVAN) 320 MG tablet Take 320 mg by mouth daily.    Marland Kitchen  methylPREDNISolone (MEDROL DOSEPAK) 4 MG TBPK tablet Take pills together in the morning with breakfast daily for 6 days. 21 tablet 0   No current facility-administered medications for this visit.     Allergies as of 08/23/2018 - Review Complete 08/23/2018  Allergen Reaction Noted  . Bee venom Itching 03/06/2016  . Codeine Itching 04/13/2013    Vitals: BP 131/84 (BP Location: Right Arm, Patient Position: Sitting)   Pulse 68   Ht 5\' 9"  (1.753 m)   Wt 211 lb (95.7 kg)   BMI 31.16 kg/m  Last Weight:  Wt Readings from Last 1 Encounters:  08/23/18 211 lb (95.7 kg)   Last Height:   Ht Readings from Last 1 Encounters:  08/23/18 5\' 9"  (1.753 m)     Physical exam: Exam: Gen: NAD, conversant, well nourised, obese, well groomed                     CV: RRR, no MRG. No Carotid Bruits. No peripheral edema, warm, nontender Eyes: Conjunctivae clear without exudates or hemorrhage  Neuro: Detailed Neurologic Exam  Speech:    Speech is normal; fluent and spontaneous with normal comprehension.  Cognition:    The patient is oriented to person, place, and time;     recent and remote memory intact;     language fluent;     normal attention, concentration,     fund of knowledge Cranial Nerves:    The pupils are equal, round, and reactive to light. The fundi are normal and spontaneous venous pulsations are present. Visual fields are full to finger confrontation. Extraocular movements are intact. Trigeminal sensation is intact and the muscles of mastication are normal. The face is symmetric.  The palate elevates in the midline. Hearing intact. Voice is normal. Shoulder shrug is normal. The tongue has normal motion without fasciculations.   Coordination:    Normal finger to nose and heel to shin. Normal rapid alternating movements.   Gait:    Heel-toe and tandem gait are normal.   Motor Observation:    No asymmetry, no atrophy, and no involuntary movements noted. Tone:    Normal muscle tone.    Posture:    Posture is normal. normal erect    Strength:    Strength is V/V in the upper and lower limbs.      Sensation: intact to LT     Reflex Exam:  DTR's:    Deep tendon reflexes in the upper and lower extremities are normal bilaterally.   Toes:    The toes are downgoing bilaterally.   Clonus:    Clonus is absent.     Assessment/Plan:  24 68 year old make with recurrent right orbital headaches. Migraines vs progressed sinusitis and maxillary cyst(seen on MRI 2010) vs supraorbital neuralgia.  (Addendum 08/29/18: Patient's brain is unremarkable, looks normal for age. But patient continues to have pan-sinusitis which can cause headaches especially with the inflammation in his ethmoid sinuses as well as has mucous retention cysts in the maxillary sinuses. The sinus issues have been going since last MR however so unclear how much is contributing to headache. I would do 2 things 1. Refer to ENT to see if his sinuses are contributing to his headaches (we can refer to Darrell. Ernesto Darrell if he does not have an ENT) 2. We referred him for sleep study. He has suspected OSA for years and if this is the case (ESS 13) then nothing will treat headaches until we fix the sleep  apnea. I would get that completed 3. If he does the above to no avail we need to consider headache and migraine medications.)  MRI of the brain w/wo contrast: Patient with right orbital pain, vision changes of the right eye, positional headache, morning headache need to evaluate for space-occupying mass or lesion.    Sleep evaluation: refer sleep team. Snoring (has to sleep in another room due to snoring, morning headaches, has suspected OSA for years) ESS 13 Start 6 day steroid taper If MRI shows enlarging mucous cyst or worsening sinusitis will refer to Darrell. Daphane Shepherd Nerve blocks - may need to repeat please call me (no charge) Labs F/u in 8 weeks but may be able to cancel this appointment if cause found and resolved with labs above.  Orders Placed This Encounter  Procedures  . MR BRAIN W WO CONTRAST  . Basic Metabolic Panel  . Sedimentation rate  . C-reactive protein  . Ambulatory referral to Sleep Studies   Meds ordered this encounter  Medications  . methylPREDNISolone (MEDROL DOSEPAK) 4 MG TBPK tablet    Sig: Take pills together in the morning with breakfast daily for 6 days.    Dispense:  21 tablet    Refill:  0    Sarina Ill, MD  [facility] [address] [place], [place] [address]  Phone [phone] Fax [phone]

## 2018-08-23 NOTE — Telephone Encounter (Signed)
Health team order sent to GI l[facility] for pt to be aware. Left GI phone number of [phone] and to give them a call if he has not heard in the next 2-3 business days.

## 2018-08-23 NOTE — Patient Instructions (Signed)
MRI of the brain Labs today Nerve block: Email me if you would like to come back for another Sleep evaluation for sleep apnea : sleep team will call you Follow up in 6 weeks however may be able to cancel this appointment if other cause found Start 6 day steroid taper May need ENT eval (Dr. Ernesto Rutherford) will see what MRI shows  Supraorbital neuralgia vs migraines vs right Maxillary sinus disease  Methylprednisolone tablets What is this medicine? METHYLPREDNISOLONE (meth ill pred NISS oh lone) is a corticosteroid. It is commonly used to treat inflammation of the skin, joints, lungs, and other organs. Common conditions treated include asthma, allergies, and arthritis. It is also used for other conditions, such as blood disorders and diseases of the adrenal glands. This medicine may be used for other purposes; ask your health care provider or pharmacist if you have questions. COMMON BRAND NAME(S): Medrol, Medrol Dosepak What should I tell my health care provider before I take this medicine? They need to know if you have any of these conditions: -Cushing's syndrome -eye disease, vision problems -diabetes -glaucoma -heart disease -high blood pressure -infection (especially a virus infection such as chickenpox, cold sores, or herpes) -liver disease -mental illness -myasthenia gravis -osteoporosis -recently received or scheduled to receive a vaccine -seizures -stomach or intestine problems -thyroid disease -an unusual or allergic reaction to lactose, methylprednisolone, other medicines, foods, dyes, or preservatives -pregnant or trying to get pregnant -breast-feeding How should I use this medicine? Take this medicine by mouth with a glass of water. Follow the directions on the prescription label. Take this medicine with food. If you are taking this medicine once a day, take it in the morning. Do not take it more often than directed. Do not suddenly stop taking your medicine because you may  develop a severe reaction. Your doctor will tell you how much medicine to take. If your doctor wants you to stop the medicine, the dose may be slowly lowered over time to avoid any side effects. Talk to your pediatrician regarding the use of this medicine in children. Special care may be needed. Overdosage: If you think you have taken too much of this medicine contact a poison control center or emergency room at once. NOTE: This medicine is only for you. Do not share this medicine with others. What if I miss a dose? If you miss a dose, take it as soon as you can. If it is almost time for your next dose, talk to your doctor or health care professional. You may need to miss a dose or take an extra dose. Do not take double or extra doses without advice. What may interact with this medicine? Do not take this medicine with any of the following medications: -alefacept -echinacea -live virus vaccines -metyrapone -mifepristone This medicine may also interact with the following medications: -amphotericin B -aspirin and aspirin-like medicines -certain antibiotics like erythromycin, clarithromycin, troleandomycin -certain medicines for diabetes -certain medicines for fungal infections like ketoconazole -certain medicines for seizures like carbamazepine, phenobarbital, phenytoin -certain medicines that treat or prevent blood clots like warfarin -cholestyramine -cyclosporine -digoxin -diuretics -male hormones, like estrogens and birth control pills -isoniazid -NSAIDs, medicines for pain inflammation, like ibuprofen or naproxen -other medicines for myasthenia gravis -rifampin -vaccines This list may not describe all possible interactions. Give your health care provider a list of all the medicines, herbs, non-prescription drugs, or dietary supplements you use. Also tell them if you smoke, drink alcohol, or use illegal drugs. Some items may interact with  your medicine. What should I watch for while  using this medicine? Tell your doctor or healthcare professional if your symptoms do not start to get better or if they get worse. Do not stop taking except on your doctor's advice. You may develop a severe reaction. Your doctor will tell you how much medicine to take. This medicine may increase your risk of getting an infection. Tell your doctor or health care professional if you are around anyone with measles or chickenpox, or if you develop sores or blisters that do not heal properly. This medicine may affect blood sugar levels. If you have diabetes, check with your doctor or health care professional before you change your diet or the dose of your diabetic medicine. Tell your doctor or health care professional right away if you have any change in your eyesight. Using this medicine for a long time may increase your risk of low bone mass. Talk to your doctor about bone health. What side effects may I notice from receiving this medicine? Side effects that you should report to your doctor or health care professional as soon as possible: -allergic reactions like skin rash, itching or hives, swelling of the face, lips, or tongue -bloody or tarry stools -changes in vision -hallucination, loss of contact with reality -muscle cramps -muscle pain -palpitations -signs and symptoms of high blood sugar such as dizziness; dry mouth; dry skin; fruity breath; nausea; stomach pain; increased hunger or thirst; increased urination -signs and symptoms of infection like fever or chills; cough; sore throat; pain or trouble passing urine -trouble passing urine or change in the amount of urine Side effects that usually do not require medical attention (report to your doctor or health care professional if they continue or are bothersome): -changes in emotions or mood -constipation -diarrhea -excessive hair growth on the face or body -headache -nausea, vomiting -trouble sleeping -weight gain This list may not  describe all possible side effects. Call your doctor for medical advice about side effects. You may report side effects to FDA at 1-800-FDA-1088. Where should I keep my medicine? Keep out of the reach of children. Store at room temperature between 20 and 25 degrees C (68 and 77 degrees F). Throw away any unused medicine after the expiration date. NOTE: This sheet is a summary. It may not cover all possible information. If you have questions about this medicine, talk to your doctor, pharmacist, or health care provider.  2019 Elsevier/Gold Standard (2015-10-10 15:53:30)

## 2018-08-24 ENCOUNTER — Telehealth: Payer: Self-pay | Admitting: *Deleted

## 2018-08-24 LAB — BASIC METABOLIC PANEL
BUN/Creatinine Ratio: 20 (ref 10–24)
BUN: 25 mg/dL (ref 8–27)
CO2: 24 mmol/L (ref 20–29)
Calcium: 9.8 mg/dL (ref 8.6–10.2)
Chloride: 98 mmol/L (ref 96–106)
Creatinine, Ser: 1.23 mg/dL (ref 0.76–1.27)
GFR calc Af Amer: 70 mL/min/{1.73_m2} (ref >59)
GFR calc non Af Amer: 60 mL/min/{1.73_m2} (ref >59)
Glucose: 88 mg/dL (ref 65–99)
Potassium: 4.7 mmol/L (ref 3.5–5.2)
Sodium: 140 mmol/L (ref 134–144)

## 2018-08-24 LAB — SEDIMENTATION RATE: Sed Rate: 2 mm/hr (ref 0–30)

## 2018-08-24 LAB — C-REACTIVE PROTEIN: CRP: 2 mg/L (ref 0–10)

## 2018-08-24 NOTE — Telephone Encounter (Signed)
Spoke with patient. Advised him that his labs are normal. He verbalized understanding. He stated that his MRI appt is tomorrow. He is aware that we will call him with the results and if needed can bring him into office to review results. Also, depending on results he may need to be referred to ENT (per Dr. Jaynee Eagles). In addition, pt stated that he started his steroid dose pack. He was encouraged to give that some time to help however if he would like another nerve block he can call.  He stated that in the past he has had a hard time with online portals so he is not interested in setting up his mychart account. Pt encouraged that they are user friendly however he is certainly not required to use it.Pt verbalized understanding and appreciation.

## 2018-08-24 NOTE — Telephone Encounter (Signed)
-----   Message from Melvenia Beam, MD sent at 08/24/2018  9:36 AM EST ----- Labs normal thanks

## 2018-08-25 ENCOUNTER — Ambulatory Visit
Admission: RE | Admit: 2018-08-25 | Discharge: 2018-08-25 | Disposition: A | Discharge status: Home / Self Care | Payer: PPO | Source: Ambulatory Visit | Attending: Neurology | Admitting: Neurology

## 2018-08-25 ENCOUNTER — Encounter (HOSPITAL_COMMUNITY): Payer: PPO

## 2018-08-25 DIAGNOSIS — R51 Headache with orthostatic component, not elsewhere classified: Secondary | ICD-10-CM

## 2018-08-25 DIAGNOSIS — H5711 Ocular pain, right eye: Secondary | ICD-10-CM

## 2018-08-25 DIAGNOSIS — G441 Vascular headache, not elsewhere classified: Secondary | ICD-10-CM

## 2018-08-25 DIAGNOSIS — H5461 Unqualified visual loss, right eye, normal vision left eye: Secondary | ICD-10-CM

## 2018-08-25 DIAGNOSIS — R519 Headache, unspecified: Secondary | ICD-10-CM

## 2018-08-25 DIAGNOSIS — H539 Unspecified visual disturbance: Secondary | ICD-10-CM

## 2018-08-25 MED ORDER — GADOBENATE DIMEGLUMINE 529 MG/ML IV SOLN
20.0000 mL | Freq: Once | INTRAVENOUS | Status: AC | PRN
  Administered 2018-08-25: 20 mL via INTRAVENOUS

## 2018-08-29 ENCOUNTER — Telehealth: Payer: Self-pay | Admitting: Neurology

## 2018-08-29 DIAGNOSIS — J329 Chronic sinusitis, unspecified: Secondary | ICD-10-CM

## 2018-08-29 NOTE — Telephone Encounter (Signed)
[place], Patient's brain is unremarkable, looks normal for age. But patient continues to have pan-sinusitis which can cause headaches especially with the inflammation in his ethmoid sinuses as well as has mucous retention cysts in the maxillary sinuses. The sinus issues have been going since last MR however so unclear how much is contributing to headache. I would do 2 things  1. Refer to ENT to see if his sinuses are contributing to his headaches (we can refer to Dr. Ernesto Rutherford if he does not have an ENT) 2. We referred him for sleep study. He has suspected OSA for years and if this is the case (ESS 13) then nothing will treat headaches until we fix the sleep apnea. I would get that completed 3. If he does the above to no avail we need to consider headache and migraine medications.  Let me know if he has any questions thanks.    IMPRESSION: This MRI of the brain with and without contrast shows the following: 1.    Some scattered T2/flair hyperintense foci consistent with mild chronic microvascular ischemic change, mildly progressed when compared to the 2010 MRI. 2.    Chronic frontal, ethmoid and left sphenoid sinusitis. 3.    There is a normal enhancement pattern and there are no acute findings.

## 2018-08-29 NOTE — Telephone Encounter (Signed)
Patient has appointment with Dr. Brett Fairy in February. I'm not sure that gives [place] enough time to get a sleep study before following up with me. I would cancel his march appointment with me and see what the sleep evaluation and ENT referral shows first. thanks

## 2018-08-30 ENCOUNTER — Encounter (HOSPITAL_COMMUNITY): Payer: PPO

## 2018-08-30 NOTE — Telephone Encounter (Signed)
I have spoke to the patient and [name] is aware of his MRI results.  [name] is agreeable to move forward with both the ENT and sleep referrals.  His appointment in March with Dr. Jaynee Eagles has been canceled until [name] has completed further evaluations with the other specialists.

## 2018-08-30 NOTE — Telephone Encounter (Signed)
He is agreeable to move forward with both the ENT and sleep referrals.  His appointment in March with Dr. Jaynee Eagles has been canceled until he has completed further evaluations with the other specialists.

## 2018-08-30 NOTE — Addendum Note (Signed)
Addended by: Noberto Retort C on: 08/30/2018 01:27 PM   Modules accepted: Orders

## 2018-09-01 ENCOUNTER — Other Ambulatory Visit: Payer: Self-pay | Admitting: *Deleted

## 2018-09-01 ENCOUNTER — Encounter (HOSPITAL_COMMUNITY): Payer: PPO

## 2018-09-01 DIAGNOSIS — J321 Chronic frontal sinusitis: Secondary | ICD-10-CM | POA: Diagnosis not present

## 2018-09-01 DIAGNOSIS — J342 Deviated nasal septum: Secondary | ICD-10-CM | POA: Diagnosis not present

## 2018-09-01 DIAGNOSIS — J322 Chronic ethmoidal sinusitis: Secondary | ICD-10-CM | POA: Diagnosis not present

## 2018-09-01 DIAGNOSIS — J410 Simple chronic bronchitis: Secondary | ICD-10-CM | POA: Diagnosis not present

## 2018-09-01 DIAGNOSIS — J040 Acute laryngitis: Secondary | ICD-10-CM | POA: Diagnosis not present

## 2018-09-01 DIAGNOSIS — J320 Chronic maxillary sinusitis: Secondary | ICD-10-CM | POA: Diagnosis not present

## 2018-09-01 MED ORDER — TOPIRAMATE 50 MG PO TABS: 50.0000 mg | ORAL_TABLET | Freq: Every day | ORAL | 3 refills | Status: DC

## 2018-09-01 NOTE — Progress Notes (Signed)
Topiramate 50 mg QHS ordered per v.o. Dr. Jaynee Eagles.

## 2018-09-01 NOTE — Progress Notes (Signed)
error 

## 2018-09-05 ENCOUNTER — Other Ambulatory Visit: Payer: Self-pay | Admitting: Otolaryngology

## 2018-09-05 DIAGNOSIS — J320 Chronic maxillary sinusitis: Secondary | ICD-10-CM

## 2018-09-05 DIAGNOSIS — J321 Chronic frontal sinusitis: Secondary | ICD-10-CM

## 2018-09-05 DIAGNOSIS — J322 Chronic ethmoidal sinusitis: Secondary | ICD-10-CM

## 2018-09-06 ENCOUNTER — Encounter (HOSPITAL_COMMUNITY): Payer: PPO | Admitting: Occupational Therapy

## 2018-09-08 ENCOUNTER — Ambulatory Visit (INDEPENDENT_AMBULATORY_CARE_PROVIDER_SITE_OTHER): Payer: PPO | Admitting: Neurology

## 2018-09-08 ENCOUNTER — Encounter: Payer: Self-pay | Admitting: Neurology

## 2018-09-08 ENCOUNTER — Other Ambulatory Visit: Payer: Self-pay | Admitting: Neurology

## 2018-09-08 ENCOUNTER — Encounter (HOSPITAL_COMMUNITY): Payer: PPO

## 2018-09-08 VITALS — BP 114/84 | HR 96 | Ht 69.0 in | Wt 210.0 lb

## 2018-09-08 DIAGNOSIS — G4701 Insomnia due to medical condition: Secondary | ICD-10-CM

## 2018-09-08 DIAGNOSIS — R69 Illness, unspecified: Secondary | ICD-10-CM | POA: Diagnosis not present

## 2018-09-08 DIAGNOSIS — G4719 Other hypersomnia: Secondary | ICD-10-CM

## 2018-09-08 DIAGNOSIS — G4761 Periodic limb movement disorder: Secondary | ICD-10-CM | POA: Diagnosis not present

## 2018-09-08 DIAGNOSIS — R351 Nocturia: Secondary | ICD-10-CM | POA: Diagnosis not present

## 2018-09-08 DIAGNOSIS — S46011S Strain of muscle(s) and tendon(s) of the rotator cuff of right shoulder, sequela: Secondary | ICD-10-CM

## 2018-09-08 DIAGNOSIS — R51 Headache: Secondary | ICD-10-CM | POA: Diagnosis not present

## 2018-09-08 DIAGNOSIS — R0683 Snoring: Secondary | ICD-10-CM | POA: Diagnosis not present

## 2018-09-08 DIAGNOSIS — R519 Headache, unspecified: Secondary | ICD-10-CM

## 2018-09-08 MED ORDER — TOPIRAMATE 100 MG PO TABS
100.0000 mg | ORAL_TABLET | Freq: Every day | ORAL | 4 refills | Status: DC
Start: 2018-09-08 — End: 2018-10-24

## 2018-09-08 NOTE — Progress Notes (Signed)
SLEEP MEDICINE CLINIC   Provider:  Larey Seat, MD   Primary Care Physician:  Jaynee Eagles, MD   Referring Provider: [name], MD    Chief Complaint  Patient presents with  . New Patient (Initial Visit)    pt alone, rm 11, pt here today referral from Dr Jaynee Eagles for sleep consult. pt snores in sleep, pt states lately he has been loosing a lot of sleep. patient states that he has constant headaches that dont go away.     HPI:  [name] is a 68 y.o. male , seen here as in a referral from Dr. Jaynee Eagles who wrote an excellent summary- she arranged to have a sleep consult for him, addressing sleep related headaches. My further thoughts were noted below dr Ferdinand Lango notes.   Provider:  Dr Jaynee Eagles CC:  Headahe HPI:  [name] is a 68 y.o. male here as requested by Dr. Nevada Crane for headache. Headache started 8 years ago. He has been to the [facility] and had a series of nerve blocks which hurt very much. His migraines went away but came back and he had the series a again a few years later. He tried Topamax and he became better and stopped for 2 years and then the headaches returned now behind the right eye. He has been treated for sinus infection. He ended up in [place] in the urgent care this past holiday in [place]. Mostly on the right behind the eye. It is pounding feeling. Mild light sensitivity. No nausea. No pain on movement. No dizziness. Right above the eyebrow. Hurts to touch it. The pain is continuous all day. Waxes ans wanes but can be very painful 8-9/10. Touching it makes it worse. Tylenol helps a little. No rashes or vision changes. This is similar to how headaches have been in the past except was worse in the past. +blurry vision right eye, + morning headache, + worsening with valsalva and supine. No other focal neurologic deficits, associated symptoms, inciting events or modifiable factors. MRI 2010 personally reviewed imaging and agree: 1.  No evidence of acute  ischemia. 2.  Non specific subcortical white matter changes supratentorially. Differential considerations are changes related to the ischemic gliosis due to small vessel disease due to hypertension and/or diabetes versus a demyelinating process or vasculitis. 3.  Moderate pansinusitis, with a probable mucous retention cystin the right maxillary sinus." end of quote.    [name] is an avid motorcyclist, and has been evaluated by Dr. Collins Scotland for causes of his headaches.  As quoted him the imaging study there is some maxillary pansinusitis, and he also reports that he just does not get enough sleep.  Sleep deprivation can certainly lower his threshold to have migraines or other headaches.    Sleep habits: he sleeps better on his right side He is known to snore loudly, he can wake up with a headache behind the eyes, a pressure sensation of me a pulsating or pounding feeling.  Mostly it seems to be the right eye that has a sensation.  He also has experienced a runny nose, dizziness daytime sleepiness and restless legs.  His arms and legs  go numb, they do often at night, this wakes him up. He has racing thoughts, not able to shut down. Left arm and hand will be tingling . He resumes another sleep position and snores loudly in supine.  He can't go back to sleep and watched TV in bed at 3-4 AM> his wife has left  the bedroom- she needs to go to work at 5 AM>    He endorsed the Epworth sleepiness score at a very high count of 18 out of 24 points, and the fatigue severity at 46 out of 63 points.  Geriatric depression score was also endorsed at a significant range of 7 points out of 15.  Sleep habits are as follows: dinner time 6 Pm and bedtime at 9-10 PM.  He may have fallen asleep on the couch already- He is known to snore loudly when sleeping there, he can wake up with a headache behind the eyes, a pressure sensation of me a pulsating or pounding feeling.  Mostly it seems to be the right eye that has a  sensation. He likes to sleep on his right- but reports his brain doesn't let him sleep.  He is always asleep at 10.30 -  He also has experienced a runny nose, dizziness daytime sleepiness and restless legs.  His arms and legs  go numb, they do often at night, this wakes him up. He has racing thoughts, not able to shut down. Has 2 nocturias each night.  Usually wakes at 3.30 for nocturia - and often stays awake until 5 AM. His Left arm and hand will be tingling .his mind is racing . He resumes another sleep position and snores loudly in supine.  He can't go back to sleep and watched TV in bed at 3-4 AM> his wife has left the bedroom- she needs to go to work at 5.30 AM>  He rises at 5 AM.   Sleep medical history ; snoring with weight gain. Started 10 -8 years ago.  Insomnia started 8 years ago- He  Gained weight, denied anxiety or depression.   Family sleep history: "nobody had apnea"-  HTN, CKD, ESRD, brother with brain tumor.    Social history: worked second  and third shift before 2002.   from [place]- married, wife still working, 3 adult children. Non smoker- 2 beers a weekend. Caffeine , rare - drinks iced tea 2-3 a week.   He works outside- does garden and house work, not longer gainfully employed, retired. 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   Review of Systems: Out of a complete 14 system review, the patient complains of only the following symptoms, and all other reviewed systems are negative. He also has experienced a runny nose, dizziness daytime sleepiness and restless legs.  His arms and legs  go numb, they do often at night, this wakes him up. He has racing thoughts, not able to shut down. Has 2 nocturias each night.     Epworth Sleepiness : 18 , Fatigue severity score 46 , depression score 7/ 15    Social History   Socioeconomic History  . Marital status: Married    Spouse name: Not on file  . Number of children: 3  . Years of education: 57  . Highest education level:  High school graduate  Occupational History  . Not on file  Social Needs  . Financial resource strain: Not on file  . Food insecurity:    Worry: Not on file    Inability: Not on file  . Transportation needs:    Medical: Not on file    Non-medical: Not on file  Tobacco Use  . Smoking status: Never Smoker  . Smokeless tobacco: Former Systems developer    Types: Snuff  Substance and Sexual Activity  . Alcohol use: Yes    Comment: may have a beer or glass of  whiskey occasionally  . Drug use: No  . Sexual activity: Yes    Birth control/protection: None  Lifestyle  . Physical activity:    Days per week: Not on file    Minutes per session: Not on file  . Stress: Not on file  Relationships  . Social connections:    Talks on phone: Not on file    Gets together: Not on file    Attends religious service: Not on file    Active member of club or organization: Not on file    Attends meetings of clubs or organizations: Not on file    Relationship status: Not on file  . Intimate partner violence:    Fear of current or ex partner: Not on file    Emotionally abused: Not on file    Physically abused: Not on file    Forced sexual activity: Not on file  Other Topics Concern  . Not on file  Social History Narrative   Lives at home with his wife Cray Monnin   Right handed   Caffeine: 2 cups daily, coffee. Maybe 1 soda a day.    Family History  Problem Relation Age of Onset  . Hypertension Mother   . Kidney failure Mother   . Diabetes Mother   . High blood pressure Father   . High blood pressure Sister   . Cancer Brother        brain tumor  . High blood pressure Brother   . Other Other   . High blood pressure Brother   . Migraines Daughter   . Migraines Grandson   . Migraines Granddaughter   . Migraines Granddaughter   . Colon cancer Neg Hx     Past Medical History:  Diagnosis Date  . Anemia    COUPLE OF YRS AGO- NO PROBLEMS SINCE  . Complication of anesthesia    REMEMBERS [place] UP  . GERD (gastroesophageal reflux disease)    RARE   . Hyperlipidemia   . Hypertension   . Migraine   . Pain    RIGHT SHOULDER - ROTATOR CUFF TEAR; denies further pain 08/23/2018.    Past Surgical History:  Procedure Laterality Date  . BACK SURGERY  2006   LOWER BACK FUSION - SCREWS AND RODS  . BACK SURGERY  06/2017  . CATARACT EXTRACTION W/PHACO Right 11/26/2015   Procedure: CATARACT EXTRACTION PHACO AND INTRAOCULAR LENS PLACEMENT (IOC);  Surgeon: [name], MD;  Location: [facility];  Service: Ophthalmology;  Laterality: Right;  CDE:6.97  . CATARACT EXTRACTION W/PHACO Left 12/10/2015   Procedure: CATARACT EXTRACTION PHACO AND INTRAOCULAR LENS PLACEMENT (IOC);  Surgeon: [name], MD;  Location: [facility];  Service: Ophthalmology;  Laterality: Left;  CDE:4.25  . CHOLECYSTECTOMY    . COLONOSCOPY WITH PROPOFOL N/A 12/02/2017   Procedure: COLONOSCOPY WITH PROPOFOL;  Surgeon: Daneil Dolin, MD;  Location: [facility];  Service: Endoscopy;  Laterality: N/A;  8:30am  . FINGERNAIL REMOVED RT INDEX FINGER - FOR CYST THAT WAS BEHIND THE NAIL    . left thumb surgery Left 06/2018   Dr. [name]  . LUMBAR SPINE SURGERY  08/11/2017   disc repair, Dr. [name]  . SHOULDER OPEN ROTATOR CUFF REPAIR Right 09/01/2013   Procedure: RIGHT SHOULDER MINI OPEN ROTATOR CUFF REPAIR WITH SUBACROMIAL DECOMPRESSION;  Surgeon: [name], MD;  Location: [facility];  Service: Orthopedics;  Laterality: Right;  . TONSILLECTOMY     SURGERY AS A CHILD  Current Outpatient Medications  Medication Sig Dispense Refill  . aspirin EC 81 MG tablet Take 81 mg by mouth daily.    Marland Kitchen atorvastatin (LIPITOR) 20 MG tablet Take 20 mg by mouth daily with breakfast.     . docusate sodium (COLACE) 100 MG capsule Take 100 mg by mouth daily.    Marland Kitchen doxycycline (VIBRA-TABS) 100 MG tablet Take 100 mg by mouth 2 (two) times daily.    . fexofenadine (ALLEGRA) 180 MG tablet Take 180 mg by mouth daily. 1/2 tablet in  am and 1/2 tablet in evening.    . Omega-3 Fatty Acids (FISH OIL) 1200 MG CAPS Take 2,400 mg by mouth daily.     . pantoprazole (PROTONIX) 40 MG tablet Take 40 mg by mouth every other day.     . propranolol (INDERAL) 10 MG tablet Take 10 mg by mouth 2 (two) times daily.    Marland Kitchen saccharomyces boulardii (FLORASTOR) 250 MG capsule Take by mouth.    . topiramate (TOPAMAX) 50 MG tablet Take 1 tablet (50 mg total) by mouth at bedtime. (Patient taking differently: Take 100 mg by mouth at bedtime. ) 30 tablet 3  . valsartan (DIOVAN) 320 MG tablet Take 320 mg by mouth daily.     No current facility-administered medications for this visit.     Allergies as of 09/08/2018 - Review Complete 09/08/2018  Allergen Reaction Noted  . Bee venom Itching 03/06/2016  . Codeine Itching 04/13/2013    Vitals: BP 114/84   Pulse 96   Ht 5\' 9"  (1.753 m)   Wt 210 lb (95.3 kg)   BMI 31.01 kg/m  Last Weight:  Wt Readings from Last 1 Encounters:  09/08/18 210 lb (95.3 kg)   IEP:PIRJ mass index is 31.01 kg/m.     Last Height:   Ht Readings from Last 1 Encounters:  09/08/18 5\' 9"  (1.753 m)    Physical exam:  General: The patient is awake, alert and appears not in acute distress. The patient is well groomed. Head: Normocephalic, atraumatic. Neck is supple. Mallampati 4.   neck circumference:16.25 . Nasal airflow is patent , TCardiovascular:  Regular rate and rhythm, without  murmurs or carotid bruit, and without distended neck veins. Respiratory: Lungs are clear to auscultation. Skin:  Without evidence of edema, or rash Trunk: BMI is 31. The patient's posture is relaxed   Neurologic exam : The patient is awake and alert, oriented to place and time.  Speech is fluent,  Slowed - without dysarthria, dysphonia or aphasia.  Mood and affect are appropriate.  Cranial nerves: Pupils are equal and briskly reactive to light.  Extraocular movements  in vertical and horizontal planes intact and without nystagmus.  Visual fields by finger perimetry are intact. Hearing impaired. .  Facial sensation intact to fine touch.  Facial motor strength is symmetric and tongue and uvula move midline. Shoulder shrug was symmetrical.   Motor exam:  Normal tone, muscle bulk and symmetric strength in all extremities.  Sensory:  Fine touch, pinprick and vibration were normal.  Proprioception tested in the upper extremities was normal. He reports numbness when sleeping , being at rest, its not an irresistible urge to move -  Coordination:Finger-to-nose maneuver normal without evidence of ataxia, dysmetria or tremor.  Gait and station: Patient walks without assistive device.  Stance is stable and normal. No falls.   Deep tendon reflexes: in the  upper and lower extremities are attenuated- absent .    Assessment:  After physical and neurologic  examination, review of laboratory studies,  Personal review of imaging studies, reports of other /same  Imaging studies, results of polysomnography and / or neurophysiology testing and pre-existing records as far as provided in visit., my assessment is   1) insomnia with racing thoughts , mind is busy- and there is a high score endorsed on geriatric depression scale.   Very poor sleep hygiene, too. Tv in the bedroom.   His sensory symptoms are also contributing to insomnia.   Excessive daytime sleepiness- 18 points, narcoleptic level.   2) snoring loudly, witnessed by bikers and wife. May have apneas.   3)absent DTR and ascending numbness. Long time present- check for CIDP?     The patient was advised of the nature of the diagnosed disorder , the treatment options and the  risks for general health and wellness arising from not treating the condition.   I spent more than 50 minutes of face to face time with the patient.  Greater than 50% of time was spent in counseling and coordination of care. We have discussed the diagnosis and differential and I answered the patient's  questions.    Plan:  Treatment plan and additional workup :  We need an attended sleep study - looking at his positional changes , snoring and OSA/ CSA possibility. I also want to know what leads to his arousals.  Sleep hygiene - all electronics off 3 minutes before bedtime, shower and get into bed, read in bed, no electronics.  Cool, quiet and dark bedroom.   EMG and NCV - per Dr. Wyvonne Lenz, MD 0/16/0109, 32:35 AM  Certified in Neurology by ABPN Certified in [place] by [facility] [address] [place], [place] [address]

## 2018-09-12 ENCOUNTER — Ambulatory Visit
Admission: RE | Admit: 2018-09-12 | Discharge: 2018-09-12 | Disposition: A | Discharge status: Home / Self Care | Payer: PPO | Source: Ambulatory Visit | Attending: Otolaryngology | Admitting: Otolaryngology

## 2018-09-12 DIAGNOSIS — J320 Chronic maxillary sinusitis: Secondary | ICD-10-CM

## 2018-09-12 DIAGNOSIS — J040 Acute laryngitis: Secondary | ICD-10-CM | POA: Diagnosis not present

## 2018-09-12 DIAGNOSIS — J329 Chronic sinusitis, unspecified: Secondary | ICD-10-CM | POA: Diagnosis not present

## 2018-09-12 DIAGNOSIS — J321 Chronic frontal sinusitis: Secondary | ICD-10-CM | POA: Diagnosis not present

## 2018-09-12 DIAGNOSIS — J322 Chronic ethmoidal sinusitis: Secondary | ICD-10-CM | POA: Diagnosis not present

## 2018-09-13 ENCOUNTER — Encounter (HOSPITAL_COMMUNITY): Payer: PPO

## 2018-09-13 ENCOUNTER — Encounter (HOSPITAL_COMMUNITY): Payer: Self-pay

## 2018-09-15 ENCOUNTER — Ambulatory Visit (HOSPITAL_COMMUNITY): Payer: PPO

## 2018-09-19 ENCOUNTER — Telehealth: Payer: Self-pay | Admitting: Neurology

## 2018-09-19 DIAGNOSIS — M1811 Unilateral primary osteoarthritis of first carpometacarpal joint, right hand: Secondary | ICD-10-CM | POA: Diagnosis not present

## 2018-09-19 DIAGNOSIS — M13841 Other specified arthritis, right hand: Secondary | ICD-10-CM | POA: Diagnosis not present

## 2018-09-19 DIAGNOSIS — G8918 Other acute postprocedural pain: Secondary | ICD-10-CM | POA: Diagnosis not present

## 2018-09-19 NOTE — Telephone Encounter (Signed)
Per Vita Barley pt returned Megan's call

## 2018-09-20 ENCOUNTER — Encounter (HOSPITAL_COMMUNITY): Payer: PPO

## 2018-09-22 ENCOUNTER — Encounter (HOSPITAL_COMMUNITY): Payer: PPO

## 2018-09-30 ENCOUNTER — Encounter: Payer: Self-pay | Admitting: Neurology

## 2018-10-03 ENCOUNTER — Ambulatory Visit (INDEPENDENT_AMBULATORY_CARE_PROVIDER_SITE_OTHER): Payer: PPO | Admitting: Neurology

## 2018-10-03 DIAGNOSIS — R351 Nocturia: Secondary | ICD-10-CM

## 2018-10-03 DIAGNOSIS — G4719 Other hypersomnia: Secondary | ICD-10-CM

## 2018-10-03 DIAGNOSIS — R69 Illness, unspecified: Secondary | ICD-10-CM

## 2018-10-03 DIAGNOSIS — G4761 Periodic limb movement disorder: Secondary | ICD-10-CM

## 2018-10-03 DIAGNOSIS — G4710 Hypersomnia, unspecified: Secondary | ICD-10-CM

## 2018-10-03 DIAGNOSIS — R519 Headache, unspecified: Secondary | ICD-10-CM

## 2018-10-03 DIAGNOSIS — G4701 Insomnia due to medical condition: Secondary | ICD-10-CM

## 2018-10-03 DIAGNOSIS — M13841 Other specified arthritis, right hand: Secondary | ICD-10-CM | POA: Diagnosis not present

## 2018-10-03 DIAGNOSIS — R0683 Snoring: Secondary | ICD-10-CM

## 2018-10-03 DIAGNOSIS — R51 Headache: Secondary | ICD-10-CM

## 2018-10-03 DIAGNOSIS — S46011S Strain of muscle(s) and tendon(s) of the rotator cuff of right shoulder, sequela: Secondary | ICD-10-CM

## 2018-10-03 DIAGNOSIS — Z5189 Encounter for other specified aftercare: Secondary | ICD-10-CM | POA: Diagnosis not present

## 2018-10-04 ENCOUNTER — Institutional Professional Consult (permissible substitution): Payer: PPO | Admitting: Neurology

## 2018-10-11 ENCOUNTER — Telehealth: Payer: Self-pay | Admitting: Neurology

## 2018-10-11 DIAGNOSIS — R0683 Snoring: Secondary | ICD-10-CM | POA: Insufficient documentation

## 2018-10-11 NOTE — Procedures (Signed)
PATIENT'S NAME:  [name], [name] DOB:      Dec 23, 1950      MR#:    [id number]     DATE OF RECORDING: 10/03/2018  AL REFERRING M.D.:  Sarina Ill. MD  Study Performed:   Baseline Polysomnogram HISTORY:  [name] is a 68 y.o. male seen for Dr Jaynee Eagles 09-08-2018.   Mr. Chauvin is an avid motorcyclist, and has been evaluated by Dr. Jaynee Eagles for causes of his headaches.  He has constant headaches, snores and "loses sleep over racing thoughts", is excessively daytime sleepy.  As quoted from the imaging study: there was maxillary pan-sinusitis, but he also reports that he just does not get enough sleep.  Sleep deprivation can certainly lower his threshold to have migraines or other headaches.    He endorsed the Epworth sleepiness score at a very high count of 18 out of 24 points, and the fatigue severity at 46 out of 63 points. Geriatric depression score was also endorsed at a significant range of 7 points out of 15.The patient's weight 209 pounds with a height of 69 (inches), resulting in a BMI of 31.1 kg/m2. The patient's neck circumference measured 16 inches.  CURRENT MEDICATIONS: Aspirin, Lipitor, Colace, Allegra, Protonix, Inderal, Topamax, Diovan   PROCEDURE:  This is a multichannel digital polysomnogram utilizing the Somnostar 11.2 system.  Electrodes and sensors were applied and monitored per AASM Specifications.   EEG, EOG, Chin and Limb EMG, were sampled at 200 Hz.  ECG, Snore and Nasal Pressure, Thermal Airflow, Respiratory Effort, CPAP Flow and Pressure, Oximetry was sampled at 50 Hz. Digital video and audio were recorded.      BASELINE STUDY: Lights Out was at 22:41 and Lights On at 04:59.  Total recording time (TRT) was 378.5 minutes, with a total sleep time (TST) of 289.5 minutes.   The patient's sleep latency was 19 minutes.  REM latency was 97.5 minutes.  The sleep efficiency was 76.5 %.     SLEEP ARCHITECTURE: WASO (Wake after sleep onset) was 70 minutes.  There were 36 minutes in Stage  N1, 189.5 minutes Stage N2, 31 minutes Stage N3 and 33 minutes in Stage REM.  The percentage of Stage N1 was 12.4%, Stage N2 was 65.5%, Stage N3 was 10.7% and Stage R (REM sleep) was 11.4%.   RESPIRATORY ANALYSIS:  There were a total of 15 respiratory events:  0 obstructive apneas, 1 central apnea and 3 mixed apneas with a total of 4 apneas and an apnea index (AI) of .8 /hour. There were 11 hypopneas with a hypopnea index of 2.3 /hour. The patient also had 8 respiratory event related arousals (RERAs).     The total APNEA/HYPOPNEA INDEX (AHI) was 3.1 /hour and the total RESPIRATORY DISTURBANCE INDEX was 4.8 /hour.  10 events occurred in REM sleep and 9 events in NREM. The REM AHI was 18.2 /hour, versus a non-REM AHI of 1.2. The patient spent 52 minutes of total sleep time in the supine position and 238 minutes in non-supine. The supine AHI was 10.4/h versus a non-supine AHI of 1.6/h.  OXYGEN SATURATION & C02:  The Wake baseline 02 saturation was 96%, with the lowest being 87%. Time spent below 89% saturation equaled 2 minutes. AROUSALS : The arousals were noted as: 40 were spontaneous, 0 were associated with PLMs, and 10 were associated with respiratory events. The patient had a total of 0 Periodic Limb Movements.    Audio and video analysis did not show any abnormal or unusual movements,  behaviors, phonations or vocalizations. The patient took one bathroom break. Loud and mild Snoring was noted, position dependent.. EKG was in keeping with normal sinus rhythm (NSR).    Post-study, the patient indicated that sleep was the same as usual.    IMPRESSION: 1. Primary Snoring, no significant apnea.  2. No hypoxemia. 3. Insomnia with extremely fragmented sleep due to spontaneous arousals, which can be pain related or non -physiological.       RECOMMENDATIONS: NO physiological reason for the arousals was found.  Insomnia treatment by behavioral health, cognitive behavior therapy.     I certify  that I have reviewed the entire raw data recording prior to the issuance of this report in accordance with the Standards of Accreditation of the American Academy of Sleep Medicine (AASM)    Larey Seat, MD   10-11-2018 Diplomat, American Board of Psychiatry and Neurology  Diplomat, American Board of [place] Director, [place] [facility] [employer]

## 2018-10-11 NOTE — Telephone Encounter (Signed)
Called the patient the reviewed his sleep study with him. Advised that based off the study there was nothing in the sleep study that indicated a sleep disorder. I advised the patient that apnea was minimal 3.1 AHI, his oxygen level was fine and there was normal EKG and no restlessness in extremities. Advised the patient to avoid sleeping on his back cause apnea did increase when laying on supine. Pt verbalized understanding. Advised the patient that Dr Jaynee Eagles will be informed of the sleep study. Pt verbalized understanding. Pt had no questions at this time but was encouraged to call back if questions arise.

## 2018-10-17 DIAGNOSIS — M13841 Other specified arthritis, right hand: Secondary | ICD-10-CM | POA: Diagnosis not present

## 2018-10-17 DIAGNOSIS — Z5189 Encounter for other specified aftercare: Secondary | ICD-10-CM | POA: Diagnosis not present

## 2018-10-18 ENCOUNTER — Ambulatory Visit: Payer: PPO | Admitting: Neurology

## 2018-10-24 ENCOUNTER — Ambulatory Visit: Payer: PPO | Admitting: Nurse Practitioner

## 2018-10-24 ENCOUNTER — Encounter: Payer: Self-pay | Admitting: Nurse Practitioner

## 2018-10-24 VITALS — BP 117/84 | HR 95 | Temp 97.2°F | Ht 69.0 in | Wt 207.0 lb

## 2018-10-24 DIAGNOSIS — K219 Gastro-esophageal reflux disease without esophagitis: Secondary | ICD-10-CM | POA: Diagnosis not present

## 2018-10-24 DIAGNOSIS — K5900 Constipation, unspecified: Secondary | ICD-10-CM

## 2018-10-24 NOTE — Patient Instructions (Addendum)
Your health issues we discussed today were:   Constipation: 1. Continue taking stool softener once daily 2. You can use Metamucil as needed for breakthrough constipation 3. Call us if you have any severe or worsening symptoms or severe or worsening rectal bleeding  GERD (heartburn): 1. Continue taking your heartburn medicine (Protonix 40 mg every other day) 2. Avoid triggers that worsen your symptoms.  You can use Tums as needed for breakthrough heartburn 3. Call us if you have any severe or worsening symptoms  Overall I recommend:  1. Follow-up as needed 2. Call us if you have any questions or concerns.  At [facility] we value your feedback. You may receive a survey about your visit today. Please share your experience as we strive to create trusting relationships with our patients to provide genuine, compassionate, quality care.  We appreciate your understanding and patience as we review any laboratory studies, imaging, and other diagnostic tests that are ordered as we care for you. Our office policy is 5 business days for review of these results, and any emergent or urgent results are addressed in a timely manner for your best interest. If you do not hear from our office in 1 week, please contact us.   We also encourage the use of MyChart, which contains your medical information for your review as well. If you are not enrolled in this feature, an access code is on this after visit summary for your convenience. Thank you for allowing [place] to be involved in your care.  It was great to see you today!  I hope you have a great day!!

## 2018-10-24 NOTE — Progress Notes (Signed)
Referring Provider: [name], MD Primary Care Physician:  [name], MD Primary GI:  Dr. Gala Romney  Chief Complaint  Patient presents with  . Constipation    doing ok    HPI:   [name] is a 68 y.o. male who presents for follow-up on constipation.  The patient was last seen in our office 02/08/2018 for the same as well as hemorrhoids.  Noted chronic constipation typically uses Metamucil which he requires every day or he will not have a bowel movement.  Even with Metamucil has hard stools.  Colonoscopy up-to-date 12/02/2017 which found nonbleeding internal and external hemorrhoids, diverticular disease, and polyps found to be tubular adenoma with recommended repeat in 5 years (2024).  At his last visit constipation was not doing well, thought he was to take stool softeners instead of Metamucil but we showed him the AVS which recommended stool softeners with Metamucil.  This was clarified.  On Colace only, having a bowel movement every 3 days.  No other GI symptoms.  Recommended continue Colace, add Metamucil every other day back into the bowel regimen, call if any problems or if no improvement.  Follow-up in 6 months.  No further communication from the patient and presumed effectiveness.  Today he states he's doing ok. Recently had surgery in his right thumb joint due to arthritis; gets his cast off this week. Constipation doing ok on stool softeners, no longer needs Metamucil. Has a bowel movement daily to twice daily. Stools typically consistent with Bristol 4, occasionally like Bristol 3. Has had a couple episodes of hematochezia when his stools were very hard and felt "like the stool was cutting me, and I saw blood when I wiped." Last episode was 2 months ago, typically happens 3-4 times a year. GERD doing well on PPI, rare breakthrough only with known triggers. Denies abdominal pain, N/V. Denies chest pain, dyspnea, dizziness, lightheadedness, syncope, near syncope. Denies any  other upper or lower GI symptoms.  Past Medical History:  Diagnosis Date  . Anemia    COUPLE OF YRS AGO- NO PROBLEMS SINCE  . Complication of anesthesia    REMEMBERS [place] [facility]  . GERD (gastroesophageal reflux disease)    RARE   . Hyperlipidemia   . Hypertension   . Migraine   . Pain    RIGHT SHOULDER - ROTATOR CUFF TEAR; denies further pain 08/23/2018.    Past Surgical History:  Procedure Laterality Date  . BACK SURGERY  2006   LOWER BACK FUSION - SCREWS AND RODS  . BACK SURGERY  06/2017  . CATARACT EXTRACTION W/PHACO Right 11/26/2015   Procedure: CATARACT EXTRACTION PHACO AND INTRAOCULAR LENS PLACEMENT (IOC);  Surgeon: [name], MD;  Location: [facility];  Service: Ophthalmology;  Laterality: Right;  CDE:6.97  . CATARACT EXTRACTION W/PHACO Left 12/10/2015   Procedure: CATARACT EXTRACTION PHACO AND INTRAOCULAR LENS PLACEMENT (IOC);  Surgeon: [name], MD;  Location: [facility];  Service: Ophthalmology;  Laterality: Left;  CDE:4.25  . CHOLECYSTECTOMY    . COLONOSCOPY WITH PROPOFOL N/A 12/02/2017   Procedure: COLONOSCOPY WITH PROPOFOL;  Surgeon: Daneil Dolin, MD;  [facility]: [facility];  Service: Endoscopy;  Laterality: N/A;  8:30am  . FINGERNAIL REMOVED RT INDEX FINGER - FOR CYST THAT WAS BEHIND THE NAIL    . left thumb surgery Left 06/2018   Dr. [name]  . LUMBAR SPINE SURGERY  08/11/2017   disc repair, Dr. [name]  . SHOULDER OPEN ROTATOR CUFF REPAIR  Right 09/01/2013   Procedure: RIGHT SHOULDER MINI OPEN ROTATOR CUFF REPAIR WITH SUBACROMIAL DECOMPRESSION;  Surgeon: [name], MD;  Location: [facility];  Service: Orthopedics;  Laterality: Right;  . TONSILLECTOMY     SURGERY AS A CHILD    Current Outpatient Medications  Medication Sig Dispense Refill  . aspirin EC 81 MG tablet Take 81 mg by mouth daily.    Marland Kitchen atorvastatin (LIPITOR) 20 MG tablet Take 20 mg by mouth daily with breakfast.     . docusate sodium (COLACE) 100 MG capsule Take 100 mg by  mouth daily.    . Omega-3 Fatty Acids (FISH OIL) 1200 MG CAPS Take 2,400 mg by mouth daily.     . pantoprazole (PROTONIX) 40 MG tablet Take 40 mg by mouth every other day.     . propranolol (INDERAL) 10 MG tablet Take 10 mg by mouth 2 (two) times daily.    . valsartan (DIOVAN) 320 MG tablet Take 320 mg by mouth daily.     No current facility-administered medications for this visit.     Allergies as of 10/24/2018 - Review Complete 10/24/2018  Allergen Reaction Noted  . Bee venom Itching 03/06/2016  . Codeine Itching 04/13/2013    Family History  Problem Relation Age of Onset  . Hypertension Mother   . Kidney failure Mother   . Diabetes Mother   . High blood pressure Father   . High blood pressure Sister   . Cancer Brother        brain tumor  . High blood pressure Brother   . Other Other   . High blood pressure Brother   . Migraines Daughter   . Migraines Grandson   . Migraines Granddaughter   . Migraines Granddaughter   . Colon cancer Neg Hx     Social History   Socioeconomic History  . Marital status: Married    Spouse name: Not on file  . Number of children: 3  . Years of education: 40  . Highest education level: High school graduate  Occupational History  . Not on file  Social Needs  . Financial resource strain: Not on file  . Food insecurity:    Worry: Not on file    Inability: Not on file  . Transportation needs:    Medical: Not on file    Non-medical: Not on file  Tobacco Use  . Smoking status: Never Smoker  . Smokeless tobacco: Former Systems developer    Types: Snuff  Substance and Sexual Activity  . Alcohol use: Yes    Comment: may have a beer or glass of whiskey occasionally  . Drug use: No  . Sexual activity: Yes    Birth control/protection: None  Lifestyle  . Physical activity:    Days per week: Not on file    Minutes per session: Not on file  . Stress: Not on file  Relationships  . Social connections:    Talks on phone: Not on file    Gets  together: Not on file    Attends religious service: Not on file    Active member of club or organization: Not on file    Attends meetings of clubs or organizations: Not on file    Relationship status: Not on file  Other Topics Concern  . Not on file  Social History Narrative   Lives at home with his wife Marrio Scribner   Right handed   Caffeine: 2 cups daily, coffee. Maybe 1 soda a day.  Review of Systems: General: Negative for anorexia, weight loss, fever, chills, fatigue, weakness. ENT: Negative for hoarseness, difficulty swallowing CV: Negative for chest pain, angina, palpitations, peripheral edema.  Respiratory: Negative for dyspnea at rest, cough, sputum, wheezing.  GI: See history of present illness. Endo: Negative for unusual weight change.  Heme: Negative for bruising or bleeding. Allergy: Negative for rash or hives.   Physical Exam: BP 117/84   Pulse 95   Temp (!) 97.2 F (36.2 C) (Oral)   Ht 5\' 9"  (1.753 m)   Wt 207 lb (93.9 kg)   BMI 30.57 kg/m  General:   Alert and oriented. Pleasant and cooperative. Well-nourished and well-developed.  Eyes:  Without icterus, sclera clear and conjunctiva pink.  Ears:  Normal auditory acuity. Cardiovascular:  S1, S2 present without murmurs appreciated. Extremities without clubbing or edema. Respiratory:  Clear to auscultation bilaterally. No wheezes, rales, or rhonchi. No distress.  Gastrointestinal:  +BS, soft, non-tender and non-distended. No HSM noted. No guarding or rebound. No masses appreciated.  Rectal:  Deferred  Musculoskalatal:  Symmetrical without gross deformities. Hard cast in place RUE from mid-forearm to mid-digits. Neurologic:  Alert and oriented x4;  grossly normal neurologically. Psych:  Alert and cooperative. Normal mood and affect. Heme/Lymph/Immune: No excessive bruising noted.    10/24/2018 12:21 PM   Disclaimer: This note was dictated with voice recognition software. Similar sounding words can  inadvertently be transcribed and may not be corrected upon review.

## 2018-10-24 NOTE — Assessment & Plan Note (Signed)
Constipation significantly improved.  Currently taking stool softener once daily.  Rare breakthrough constipation.  Rare rectal bleeding, typically a couple times a year.  Has not happened in 3 months.  Colonoscopy up-to-date and recommended repeat in 5 years with notation made of nonbleeding internal and external hemorrhoids.  Recommend he use Metamucil as needed in addition to Colace daily.  Follow-up as needed.

## 2018-10-24 NOTE — Assessment & Plan Note (Signed)
Chronic GERD, currently doing well on PPI.  Recommend continue current medications and follow-up as needed.

## 2018-10-25 NOTE — Progress Notes (Signed)
CC'D TO PCP °

## 2018-10-28 ENCOUNTER — Ambulatory Visit (HOSPITAL_COMMUNITY): Payer: PPO | Admitting: Specialist

## 2018-10-28 ENCOUNTER — Ambulatory Visit (HOSPITAL_COMMUNITY): Payer: PPO | Attending: Orthopedic Surgery | Admitting: Specialist

## 2018-10-28 ENCOUNTER — Encounter (HOSPITAL_COMMUNITY): Payer: Self-pay | Admitting: Specialist

## 2018-10-28 ENCOUNTER — Encounter (HOSPITAL_COMMUNITY): Payer: Self-pay

## 2018-10-28 ENCOUNTER — Other Ambulatory Visit: Payer: Self-pay

## 2018-10-28 DIAGNOSIS — M25641 Stiffness of right hand, not elsewhere classified: Secondary | ICD-10-CM | POA: Diagnosis not present

## 2018-10-28 DIAGNOSIS — M25531 Pain in right wrist: Secondary | ICD-10-CM | POA: Diagnosis not present

## 2018-10-28 DIAGNOSIS — M25631 Stiffness of right wrist, not elsewhere classified: Secondary | ICD-10-CM | POA: Insufficient documentation

## 2018-10-28 DIAGNOSIS — R278 Other lack of coordination: Secondary | ICD-10-CM

## 2018-10-28 DIAGNOSIS — M13841 Other specified arthritis, right hand: Secondary | ICD-10-CM | POA: Diagnosis not present

## 2018-10-28 DIAGNOSIS — M79641 Pain in right hand: Secondary | ICD-10-CM | POA: Diagnosis not present

## 2018-10-28 DIAGNOSIS — R29898 Other symptoms and signs involving the musculoskeletal system: Secondary | ICD-10-CM | POA: Diagnosis not present

## 2018-10-28 DIAGNOSIS — Z5189 Encounter for other specified aftercare: Secondary | ICD-10-CM | POA: Diagnosis not present

## 2018-10-28 DIAGNOSIS — M79644 Pain in right finger(s): Secondary | ICD-10-CM | POA: Diagnosis not present

## 2018-10-28 NOTE — Therapy (Signed)
[place], [place], [address] Phone: [phone]   Fax:  [phone]  Occupational Therapy Evaluation  Patient Details  Name: [name] MRN: [id number] Date of Birth: [birth year] Referring Provider (OT): Dr. Iran Planas   Encounter Date: 10/28/2018  OT End of Session - 10/28/18 1702    Visit Number  1    Number of Visits  16    Date for OT Re-Evaluation  12/23/18   4/10 mini reassessment   Authorization Type  [employer] $15 copay    Authorization Time Period  no visit limit    OT Start Time  1530    OT Stop Time  1615    OT Time Calculation (min)  45 min    Activity Tolerance  Patient tolerated treatment well    Behavior During Therapy  [facility] for tasks assessed/performed       Past Medical History:  Diagnosis Date  . Anemia    COUPLE OF YRS AGO- NO PROBLEMS SINCE  . Complication of anesthesia    REMEMBERS [place] [facility]  . GERD (gastroesophageal reflux disease)    RARE   . Hyperlipidemia   . Hypertension   . Migraine   . Pain    RIGHT SHOULDER - ROTATOR CUFF TEAR; denies further pain 08/23/2018.    Past Surgical History:  Procedure Laterality Date  . BACK SURGERY  2006   LOWER BACK FUSION - SCREWS AND RODS  . BACK SURGERY  06/2017  . CATARACT EXTRACTION W/PHACO Right 11/26/2015   Procedure: CATARACT EXTRACTION PHACO AND INTRAOCULAR LENS PLACEMENT (IOC);  Surgeon: [name], MD;  Location: [facility];  Service: Ophthalmology;  Laterality: Right;  CDE:6.97  . CATARACT EXTRACTION W/PHACO Left 12/10/2015   Procedure: CATARACT EXTRACTION PHACO AND INTRAOCULAR LENS PLACEMENT (IOC);  Surgeon: [name], MD;  Location: [facility];  Service: Ophthalmology;  Laterality: Left;  CDE:4.25  . CHOLECYSTECTOMY    . COLONOSCOPY WITH PROPOFOL N/A 12/02/2017   Procedure: COLONOSCOPY WITH PROPOFOL;  Surgeon: Daneil Dolin, MD;  [facility];  Service: Endoscopy;  Laterality: N/A;   8:30am  . FINGERNAIL REMOVED RT INDEX FINGER - FOR CYST THAT WAS BEHIND THE NAIL    . left thumb surgery Left 06/2018   Dr. [name]  . LUMBAR SPINE SURGERY  08/11/2017   disc repair, Dr. [name]  . SHOULDER OPEN ROTATOR CUFF REPAIR Right 09/01/2013   Procedure: RIGHT SHOULDER MINI OPEN ROTATOR CUFF REPAIR WITH SUBACROMIAL DECOMPRESSION;  Surgeon: [name], MD;  [facility];  Service: Orthopedics;  Laterality: Right;  . TONSILLECTOMY     SURGERY AS A CHILD    There were no vitals filed for this visit.  Subjective Assessment - 10/28/18 1657    Subjective   S:  I had the splint made today, the straps are bothering me a bit.     Pertinent History  Patient is a 68 y/o male S/P right thumb CMC arthroplasty which was performed on 09/16/18 due to severe arthritis. Patient was recently placed in a fabricated thumb spica splint on 10/28/18 which he is to wear at all time unless bathing and completing HEP. Dr. [name] had referred patient to occupational therapy for evaluation and treatment, following the [facility] arthroplasty [place] Hand Protocol.  Patient was recently seen in this clinic for left thumb [facility] arthroplasty    Patient Stated Goals  To be able to use my righ hand normally.  Currently in Pain?  Yes    Pain Score  5     Pain Location  Finger (Comment which one)   thumb   Pain Orientation  Right    Pain Descriptors / Indicators  Aching    Pain Type  Acute pain        [facility] OT Assessment - 10/28/18 0001      Assessment   Medical Diagnosis  S/P right [facility] athroplasty    Referring Provider (OT)  Dr. Iran Planas    Onset Date/Surgical Date  09/15/18    Hand Dominance  Right    Prior Therapy  in december for his left thumb      Restrictions   Weight Bearing Restrictions  No      Balance Screen   Has the patient fallen in the past 6 months  No      Home  Environment   Family/patient expects to be discharged to:  Private residence      Prior Function   Level of  Independence  Independent    Vocation  Retired    Leisure  Enjoys working around [employer].       ADL   ADL comments  Unable to utilize his right hand for any daily tasks at this time.       Mobility   Mobility Status  Independent      Written Expression   Dominant Hand  Right      Vision - History   Baseline Vision  No visual deficits      Cognition   Overall Cognitive Status  Within Functional Limits for tasks assessed      Observation/Other Assessments   Skin Integrity  2 healed incision with steri strips. 4.5 cm and 4 cm along CMC joint region of right hand.       Sensation   Light Touch  Appears Intact      Coordination   9 Hole Peg Test  Right;Left    Right 9 Hole Peg Test  1'01" therapist handing pegs to patient    Left 9 Hole Peg Test  21.69"    Other  webspace: right 5 cm left 6 cm      Edema   Edema  no significant edema present       ROM / Strength   AROM / PROM / Strength  AROM;PROM;Strength      Palpation   Palpation comment  Moderate facial restrictions in right palm, thenar eminance, wrist and forearm      AROM   AROM Assessment Site  Wrist    Right/Left Wrist  Right    Left Wrist Extension  45 Degrees    Left Wrist Flexion  30 Degrees    Left Wrist Radial Deviation  15 Degrees    Left Wrist Ulnar Deviation  10 Degrees      PROM   PROM Assessment Site  Wrist    Right/Left Wrist  Right    Left Wrist Extension  45 Degrees    Left Wrist Flexion  32 Degrees    Left Wrist Radial Deviation  15 Degrees    Left Wrist Ulnar Deviation  10 Degrees      Strength   Overall Strength Comments  not assessed due to recent surgery    Strength Assessment Site  Wrist    Right/Left Wrist  Right      Right Hand AROM   R Thumb MCP 0-60  12 Degrees  R Thumb IP 0-80  40 Degrees    R Thumb Opposition to Index  --   can oppose to long finger, lacks 3.5 cm to small finger    R Index  MCP 0-90  78 Degrees    R Index PIP 0-100  84 Degrees    R Index DIP 0-70  50  Degrees    R Long  MCP 0-90  78 Degrees    R Long PIP 0-100  84 Degrees    R Long DIP 0-70  50 Degrees    R Ring  MCP 0-90  78 Degrees    R Ring PIP 0-100  82 Degrees    R Ring DIP 0-70  50 Degrees    R Little  MCP 0-90  80 Degrees    R Little PIP 0-100  80 Degrees    R Little DIP 0-70  50 Degrees               OT Treatments/Exercises (OP) - 10/28/18 0001      Manual Therapy   Manual Therapy  Myofascial release    Manual therapy comments  manual therapy completed prior to exercises    Myofascial Release  Myofascial release and manual stretching completed to right thumb, wrist, and forearm region to decrease fascial restrictions and increase joint mobility in a pain free zone.             OT Education - 10/28/18 1702    Education Details  a/rom wrist, hand, thumb    Person(s) Educated  Patient    Methods  Explanation;Demonstration;Handout    Comprehension  Verbalized understanding;Returned demonstration       OT Short Term Goals - 10/28/18 1709      OT SHORT TERM GOAL #1   Title  Patient will be educated and independent with his HEP in order to facilitate his progress in therapy and increase the use of his right hand with simple daily tasks.     Time  4    Period  Weeks    Status  New    Target Date  11/25/18      OT SHORT TERM GOAL #2   Title  Patient will increase his right hand fine motor coordination by completing the 9 hole peg test in 40 seconds or less without need for peg setup.     Time  4    Period  Weeks    Status  New      OT SHORT TERM GOAL #3   Title  Patient will increase his right grip strength by 10# and his pinch strength by 5# in order to be able to hold onto lightweight items at home.     Time  4    Period  Weeks    Status  New      OT SHORT TERM GOAL #4   Title  Patient will report a pain level of 5/10 or less when using his right hand for daily tasks.     Time  4    Period  Weeks    Status  New      OT SHORT TERM GOAL #5    Title  patient will increase his right hand and wrist A/ROM to [facility] in order to be able to grasp items such as the steering wheel and phone with his right hand.     Time  4    Period  Weeks    Status  New  OT Long Term Goals - 10/28/18 1710      OT LONG TERM GOAL #1   Title  Patient will return to highest level of independence while using his right  hand as dominant with all daily tasks.    Time  8    Period  Weeks    Status  New    Target Date  12/23/18      OT LONG TERM GOAL #2   Title  Patient will increase fine motor coordination by completing the 9 hole peg test in 25 seconds or less with his right in order to return to completing simple household tasks.     Time  8    Period  Weeks    Status  New      OT LONG TERM GOAL #3   Title  Patient will increase right hand grip strength by 15# and pinch strength by 10# in order to hold tools and items of weight without difficulty.     Time  8    Period  Weeks    Status  New      OT LONG TERM GOAL #4   Title  Patient will increase right wrist strength to 4+/5 in order to utilize his right hand to push up from a chair and hold items of 5# or greater.     Time  8    Period  Weeks    Status  New      OT LONG TERM GOAL #5   Title  Patient will decrease fascial restrictions to trace amount in his right hand in order to increase the functional mobility needed to complete daily tasks.     Time  8    Period  Weeks    Status  New      Long Term Additional Goals   Additional Long Term Goals  Yes      OT LONG TERM GOAL #6   Title  Patient will decrease pain to 3/10 or less in his right hand when completing functional activities.     Time  8    Period  Weeks    Status  New            Plan - 10/28/18 1703    Clinical Impression Statement  A:  Patient is a 68 year old male with past medical history significant for left thumb CMC sx 2019, 2 back surgeries, right rotator cuff repair in 2015, and most recently right CMC  arthroplasty.  Patient is right hand dominant and is not able to use his dominant hand with any functional activiites.      OT Occupational Profile and History  Detailed Assessment- Review of Records and additional review of physical, cognitive, psychosocial history related to current functional performance    Occupational Profile and client history currently impacting functional performance  motivated to return to prior level of function. Strong social support at home.     Occupational performance deficits (Please refer to evaluation for details):  ADL's;IADL's;Rest and Sleep;Work;Leisure    Body Structure / Function / Physical Skills  ADL;Dexterity;Muscle spasms;ROM;Strength;Scar mobility;IADL;FMC;Coordination;Pain;UE functional use;Proprioception;GMC;Fascial restriction    Rehab Potential  Good    Clinical Decision Making  Several treatment options, min-mod task modification necessary    Comorbidities Affecting Occupational Performance:  May have comorbidities impacting occupational performance    Comorbidities impacting occupational performance description:  recent left [place] jt replacement    Modification or Assistance to Complete Evaluation   Min-Moderate modification of  tasks or assist with assess necessary to complete eval    OT Frequency  2x / week    OT Duration  8 weeks    OT Treatment/Interventions  Self-care/ADL training;Cryotherapy;Therapeutic exercise;DME and/or AE instruction;Electrical Stimulation;Ultrasound;Neuromuscular education;Manual Therapy;Scar mobilization;Splinting;Therapeutic activities;Passive range of motion;Energy conservation;Iontophoresis;Moist Heat    Plan  P:  Skilled OT intervention to decrease pain and fascial restrictions and improve pain free mobiilty in his dominant right hand in order to return to use of right hand as dominant with all B/IADLS and leisure activities.     OT Home Exercise Plan  10/28/18:  wrist and hand a/rom     Consulted and Agree with Plan of  Care  Patient       Patient will benefit from skilled therapeutic intervention in order to improve the following deficits and impairments:  Body Structure / Function / Physical Skills  Visit Diagnosis: Pain in right wrist - Plan: Ot plan of care cert/re-cert  Pain in right hand - Plan: Ot plan of care cert/re-cert  Stiffness of right hand, not elsewhere classified - Plan: Ot plan of care cert/re-cert  Stiffness of right wrist, not elsewhere classified - Plan: Ot plan of care cert/re-cert  Other symptoms and signs involving the musculoskeletal system - Plan: Ot plan of care cert/re-cert  Other lack of coordination - Plan: Ot plan of care cert/re-cert    Problem List Patient Active Problem List   Diagnosis Date Noted  . GERD (gastroesophageal reflux disease) 10/24/2018  . Snoring 10/11/2018  . Right temporal headache 08/23/2018  . Hemorrhoids 02/08/2018  . Constipation 11/02/2017  . Taking medication for chronic disease 11/02/2017  . Right rotator cuff tear 09/01/2013  . Rotator cuff tear, right 09/01/2013    Vangie Bicker, [place], OTR/L [phone]  10/28/2018, 5:31 PM  [place], [place], [address] Phone: [phone]   Fax:  [phone]  Name: [name] MRN: [id number] Date of Birth: 08/30/1950

## 2018-10-28 NOTE — Patient Instructions (Signed)
AROM: DIP Flexion / Extension   Pinch middle knuckle of ________ finger of right hand to prevent bending. Bend end knuckle until stretch is felt. Hold ____ seconds. Relax. Straighten finger as far as possible. Repeat ____ times per set. Do ____ sets per session. Do ____ sessions per day.  Copyright  VHI. All rights reserved.    AROM: PIP Flexion / Extension   Pinch bottom knuckle of ________ finger of right hand to prevent bending. Actively bend middle knuckle until stretch is felt. Hold ____ seconds. Relax. Straighten finger as far as possible. Repeat ____ times per set. Do ____ sets per session. Do ____ sessions per day.  Copyright  VHI. All rights reserved.   AROM: Finger Flexion / Extension   Actively bend fingers of right hand. Start with knuckles furthest from palm, and slowly make a fist. Hold ____ seconds. Relax. Then straighten fingers as far as possible. Repeat ____ times per set. Do ____ sets per session. Do ____ sessions per day.  Copyright  VHI. All rights reserved.   Paper Crumpling Exercise   Begin with right palm down on piece of paper. Maintaining contact between surface and heel of hand, crumple paper into a ball. Repeat ____ times per set. Do ____ sets per session. Do ____ sessions per day.  Copyright  VHI. All rights reserved.     Towel Roll Squeeze   With right forearm resting on surface, gently squeeze towel. Repeat ____ times per set. Do ____ sets per session. Do ____ sessions per day.  Copyright  VHI. All rights reserved.   Abduction / Adduction (Active)    With hand flat on table, spread all fingers apart, then bring them together as close as possible. Repeat ____ times. Do ____ sessions per day.  Copyright  VHI. All rights reserved.  AROM: Thumb Abduction / Adduction   Actively pull right thumb away from palm as far as possible. Hold ____ seconds. Then bring thumb back to touch fingers. Try not to bend fingers toward thumb. Repeat  ____ times per set. Do ____ sets per session. Do ____ sessions per day.  Copyright  VHI. All rights reserved.  AROM: Wrist Flexion / Extension  Actively bend right wrist forward then back as far as possible. Repeat ____ times per set. Do ____ sets per session. Do ____ sessions per day.  Copyright  VHI. All rights reserved.  AROM: Wrist Radial / Ulnar Deviation   Gently bend left wrist from side to side as far as possible. Repeat ____ times per set. Do ____ sets per session. Do ____ sessions per day.  Copyright  VHI. All rights reserved.  AROM: Forearm Pronation / Supination   With right arm in handshake position, slowly rotate palm down until stretch is felt. Relax. Then rotate palm up until stretch is felt. Repeat ____ times per set. Do ____ sets per session. Do ____ sessions per day.  Copyright  VHI. All rights reserved.

## 2018-11-01 ENCOUNTER — Encounter (HOSPITAL_COMMUNITY): Payer: Self-pay

## 2018-11-01 ENCOUNTER — Other Ambulatory Visit: Payer: Self-pay

## 2018-11-01 ENCOUNTER — Ambulatory Visit (HOSPITAL_COMMUNITY): Payer: PPO

## 2018-11-01 DIAGNOSIS — M79641 Pain in right hand: Secondary | ICD-10-CM

## 2018-11-01 DIAGNOSIS — M25631 Stiffness of right wrist, not elsewhere classified: Secondary | ICD-10-CM

## 2018-11-01 DIAGNOSIS — R29898 Other symptoms and signs involving the musculoskeletal system: Secondary | ICD-10-CM

## 2018-11-01 DIAGNOSIS — M25531 Pain in right wrist: Secondary | ICD-10-CM

## 2018-11-01 DIAGNOSIS — M25641 Stiffness of right hand, not elsewhere classified: Secondary | ICD-10-CM

## 2018-11-01 NOTE — Therapy (Signed)
[facility] [address] [place], [place], [address] Phone: [phone]   Fax:  [phone]  Occupational Therapy Treatment  Patient Details  Name: [name] MRN: [id number] Date of Birth: 04-Dec-1950 Referring Provider (OT): Dr. Iran Planas   Encounter Date: 11/01/2018  OT End of Session - 11/01/18 0953    Visit Number  2    Number of Visits  16    Date for OT Re-Evaluation  12/23/18   4/10 mini reassessment   Authorization Type  [employer] $15 copay    Authorization Time Period  no visit limit    OT Start Time  0903    OT Stop Time  0945    OT Time Calculation (min)  42 min    Activity Tolerance  Patient tolerated treatment well    Behavior During Therapy  [facility] for tasks assessed/performed       Past Medical History:  Diagnosis Date  . Anemia    COUPLE OF YRS AGO- NO PROBLEMS SINCE  . Complication of anesthesia    REMEMBERS [facility]  . GERD (gastroesophageal reflux disease)    RARE   . Hyperlipidemia   . Hypertension   . Migraine   . Pain    RIGHT SHOULDER - ROTATOR CUFF TEAR; denies further pain 08/23/2018.    Past Surgical History:  Procedure Laterality Date  . BACK SURGERY  2006   LOWER BACK FUSION - SCREWS AND RODS  . BACK SURGERY  06/2017  . CATARACT EXTRACTION W/PHACO Right 11/26/2015   Procedure: CATARACT EXTRACTION PHACO AND INTRAOCULAR LENS PLACEMENT (IOC);  Surgeon: [name], MD;  Location: [facility];  Service: Ophthalmology;  Laterality: Right;  CDE:6.97  . CATARACT EXTRACTION W/PHACO Left 12/10/2015   Procedure: CATARACT EXTRACTION PHACO AND INTRAOCULAR LENS PLACEMENT (IOC);  Surgeon: [name], MD;  Location: [facility];  Service: Ophthalmology;  Laterality: Left;  CDE:4.25  . CHOLECYSTECTOMY    . COLONOSCOPY WITH PROPOFOL N/A 12/02/2017   Procedure: COLONOSCOPY WITH PROPOFOL;  Surgeon: Daneil Dolin, MD;  Location: [facility];  Service: Endoscopy;  Laterality: N/A;   8:30am  . FINGERNAIL REMOVED RT INDEX FINGER - FOR CYST THAT WAS BEHIND THE NAIL    . left thumb surgery Left 06/2018   Dr. [name]  . LUMBAR SPINE SURGERY  08/11/2017   disc repair, Dr. [name]  . SHOULDER OPEN ROTATOR CUFF REPAIR Right 09/01/2013   Procedure: RIGHT SHOULDER MINI OPEN ROTATOR CUFF REPAIR WITH SUBACROMIAL DECOMPRESSION;  Surgeon: [name], MD;  Location: [facility];  Service: Orthopedics;  Laterality: Right;  . TONSILLECTOMY     SURGERY AS A CHILD    There were no vitals filed for this visit.  Subjective Assessment - 11/01/18 0933    Subjective   S: The side of my brace is rubbing on the incision.     Currently in Pain?  Yes    Pain Score  7     Pain Location  Wrist    Pain Orientation  Right    Pain Descriptors / Indicators  Aching    Pain Type  Surgical pain    Pain Radiating Towards  N/A    Pain Onset  More than a month ago    Pain Frequency  Occasional    Aggravating Factors   Movement    Pain Relieving Factors  rest, ice    Effect of Pain on Daily Activities  unable to use his right  hand for any daily tasks at this time.    Multiple Pain Sites  No         [facility] OT Assessment - 11/01/18 0934      Assessment   Medical Diagnosis  S/P right CMC athroplasty      Precautions   Precautions  Other (comment)    Precaution Comments  Follow [place] protocol: Gentle ROM of thumb and wrist, Week 8-10 (12/11-12/25): light strengthening.  ok to progress to soft CMC brace at 10 weeks. No precautions at 12 weeks post op.         Katina Dung - 11/01/18 4431    Open a tight or new jar  Unable    Do heavy household chores (wash walls, wash floors)  Unable    Carry a shopping bag or briefcase  Mild difficulty    Wash your back  Unable    Use a knife to cut food  Unable    Recreational activities in which you take some force or impact through your arm, shoulder, or hand (golf, hammering, tennis)  Unable    During the past week, to what extent has  your arm, shoulder or hand problem interfered with your normal social activities with family, friends, neighbors, or groups?  Not at all    During the past week, to what extent has your arm, shoulder or hand problem limited your work or other regular daily activities  Modererately    Arm, shoulder, or hand pain.  Moderate    Tingling (pins and needles) in your arm, shoulder, or hand  None    Difficulty Sleeping  No difficulty    DASH Score  56.82 %           OT Treatments/Exercises (OP) - 11/01/18 0935      Exercises   Exercises  Hand;Wrist      Wrist Exercises   Wrist Flexion  PROM;5 reps;AROM;10 reps    Wrist Extension  PROM;5 reps;AROM;10 reps    Wrist Radial Deviation  PROM;5 reps;AROM;10 reps    Wrist Ulnar Deviation  PROM;5 reps;AROM;10 reps      Additional Wrist Exercises   Sponges  15,15      Hand Exercises   MCPJ Flexion  PROM;10 reps    MCPJ Extension  PROM;10 reps    DIPJ Flexion  PROM;10 reps    DIPJ Extension  PROM;10 reps    Opposition  AROM;10 reps   thumb towards base of small digit (MCP joint)     Manual Therapy   Manual Therapy  Myofascial release;Edema management    Manual therapy comments  manual therapy completed prior to exercises    Edema Management  Edema management techniques completed to right thumb, wrist, and forearm to decrease swelling and increase joint mobility.     Myofascial Release  Myofascial release and manual stretching completed to right thumb, wrist, and forearm region to decrease fascial restrictions and increase joint mobility in a pain free zone.              OT Education - 11/01/18 0941    Education Details  Reviewed goals.     Person(s) Educated  Patient    Methods  Explanation    Comprehension  Verbalized understanding       OT Short Term Goals - 11/01/18 0956      OT SHORT TERM GOAL #1   Title  Patient will be educated and independent with his HEP in order to facilitate  his progress in therapy and increase the  use of his right hand with simple daily tasks.     Time  4    Period  Weeks    Status  On-going    Target Date  11/25/18      OT SHORT TERM GOAL #2   Title  Patient will increase his right hand fine motor coordination by completing the 9 hole peg test in 40 seconds or less without need for peg setup.     Time  4    Period  Weeks    Status  On-going      OT SHORT TERM GOAL #3   Title  Patient will increase his right grip strength by 10# and his pinch strength by 5# in order to be able to hold onto lightweight items at home.     Time  4    Period  Weeks    Status  On-going      OT SHORT TERM GOAL #4   Title  Patient will report a pain level of 5/10 or less when using his right hand for daily tasks.     Time  4    Period  Weeks    Status  On-going      OT SHORT TERM GOAL #5   Title  patient will increase his right hand and wrist A/ROM to [facility] in order to be able to grasp items such as the steering wheel and phone with his right hand.     Time  4    Period  Weeks    Status  On-going        OT Long Term Goals - 11/01/18 0957      OT LONG TERM GOAL #1   Title  Patient will return to highest level of independence while using his right  hand as dominant with all daily tasks.    Time  8    Period  Weeks    Status  On-going      OT LONG TERM GOAL #2   Title  Patient will increase fine motor coordination by completing the 9 hole peg test in 25 seconds or less with his right in order to return to completing simple household tasks.     Time  8    Period  Weeks    Status  On-going      OT LONG TERM GOAL #3   Title  Patient will increase right hand grip strength by 15# and pinch strength by 10# in order to hold tools and items of weight without difficulty.     Time  8    Period  Weeks    Status  On-going      OT LONG TERM GOAL #4   Title  Patient will increase right wrist strength to 4+/5 in order to utilize his right hand to push up from a chair and hold items of 5# or greater.      Time  8    Period  Weeks    Status  On-going      OT LONG TERM GOAL #5   Title  Patient will decrease fascial restrictions to trace amount in his right hand in order to increase the functional mobility needed to complete daily tasks.     Time  8    Period  Weeks    Status  On-going      OT LONG TERM GOAL #6   Title  Patient will decrease pain  to 3/10 or less in his right hand when completing functional activities.     Time  8    Period  Weeks    Status  On-going            Plan - 11/01/18 0953    Clinical Impression Statement  A: Initiated myofascial release and edema management techniques this date as patient presents with increased edema in thumb region. Splint adjusted to decrease the amount of pressure placed on incision. Patient with limited ROM and mobility due to edema and recent surgery.    Body Structure / Function / Physical Skills  ADL;Dexterity;Muscle spasms;ROM;Strength;Scar mobility;IADL;FMC;Coordination;Pain;UE functional use;Proprioception;GMC;Fascial restriction    Plan  P: Follow protocol. Complete myofascial release and edema management technique prior to gentle passive stretching. Continue with gentle ROM activities. Attempt coordination task with deck of cards.     Consulted and Agree with Plan of Care  Patient       Patient will benefit from skilled therapeutic intervention in order to improve the following deficits and impairments:  Body Structure / Function / Physical Skills  Visit Diagnosis: Pain in right wrist  Pain in right hand  Stiffness of right hand, not elsewhere classified  Stiffness of right wrist, not elsewhere classified  Other symptoms and signs involving the musculoskeletal system    Problem List Patient Active Problem List   Diagnosis Date Noted  . GERD (gastroesophageal reflux disease) 10/24/2018  . Snoring 10/11/2018  . Right temporal headache 08/23/2018  . Hemorrhoids 02/08/2018  . Constipation 11/02/2017  . Taking  medication for chronic disease 11/02/2017  . Right rotator cuff tear 09/01/2013  . Rotator cuff tear, right 09/01/2013   Ailene Ravel, OTR/L,CBIS  [phone]  11/01/2018, 9:59 AM  [facility] [address] [place], [place], [address] Phone: [phone]   Fax:  [phone]  Name: [name] MRN: [id number] Date of Birth: Jul 05, 1951

## 2018-11-04 ENCOUNTER — Ambulatory Visit (HOSPITAL_COMMUNITY): Payer: PPO | Admitting: Occupational Therapy

## 2018-11-10 ENCOUNTER — Ambulatory Visit (HOSPITAL_COMMUNITY): Payer: PPO | Admitting: Occupational Therapy

## 2018-11-16 ENCOUNTER — Encounter (HOSPITAL_COMMUNITY): Payer: PPO | Admitting: Occupational Therapy

## 2018-11-17 ENCOUNTER — Telehealth (HOSPITAL_COMMUNITY): Payer: Self-pay | Admitting: Occupational Therapy

## 2018-11-17 NOTE — Telephone Encounter (Signed)
Patient was contacted today regarding the temporary reduction of OP rehab services due to concerns for community transmission of Covid-19.   Left message for pt to return call to discuss wrist and hand functioning and for updated HEP if needed.    Guadelupe Sabin, OTR/L  [phone] 11/17/2018

## 2018-11-18 ENCOUNTER — Encounter (HOSPITAL_COMMUNITY): Payer: PPO

## 2018-11-22 ENCOUNTER — Encounter (HOSPITAL_COMMUNITY): Payer: PPO

## 2018-11-24 ENCOUNTER — Encounter (HOSPITAL_COMMUNITY): Payer: PPO | Admitting: Occupational Therapy

## 2018-11-24 DIAGNOSIS — K219 Gastro-esophageal reflux disease without esophagitis: Secondary | ICD-10-CM | POA: Diagnosis not present

## 2018-11-24 DIAGNOSIS — Z4789 Encounter for other orthopedic aftercare: Secondary | ICD-10-CM | POA: Diagnosis not present

## 2018-11-24 DIAGNOSIS — Z8619 Personal history of other infectious and parasitic diseases: Secondary | ICD-10-CM | POA: Diagnosis not present

## 2018-11-24 DIAGNOSIS — Z87891 Personal history of nicotine dependence: Secondary | ICD-10-CM | POA: Diagnosis not present

## 2018-11-24 DIAGNOSIS — Z6838 Body mass index (BMI) 38.0-38.9, adult: Secondary | ICD-10-CM | POA: Diagnosis not present

## 2018-11-24 DIAGNOSIS — Z79899 Other long term (current) drug therapy: Secondary | ICD-10-CM | POA: Diagnosis not present

## 2018-11-24 DIAGNOSIS — Z923 Personal history of irradiation: Secondary | ICD-10-CM | POA: Diagnosis not present

## 2018-11-24 DIAGNOSIS — C61 Malignant neoplasm of prostate: Secondary | ICD-10-CM | POA: Diagnosis not present

## 2018-11-24 DIAGNOSIS — E669 Obesity, unspecified: Secondary | ICD-10-CM | POA: Diagnosis not present

## 2018-11-24 DIAGNOSIS — M13841 Other specified arthritis, right hand: Secondary | ICD-10-CM | POA: Diagnosis not present

## 2018-11-28 ENCOUNTER — Encounter (HOSPITAL_COMMUNITY): Payer: PPO | Admitting: Specialist

## 2018-12-02 ENCOUNTER — Encounter (HOSPITAL_COMMUNITY): Payer: PPO | Admitting: Specialist

## 2018-12-05 ENCOUNTER — Encounter (HOSPITAL_COMMUNITY): Payer: PPO | Admitting: Specialist

## 2018-12-09 ENCOUNTER — Encounter (HOSPITAL_COMMUNITY): Payer: PPO | Admitting: Specialist

## 2018-12-12 ENCOUNTER — Encounter (HOSPITAL_COMMUNITY): Payer: PPO | Admitting: Specialist

## 2018-12-14 DIAGNOSIS — Z0000 Encounter for general adult medical examination without abnormal findings: Secondary | ICD-10-CM | POA: Diagnosis not present

## 2018-12-16 ENCOUNTER — Encounter (HOSPITAL_COMMUNITY): Payer: PPO | Admitting: Specialist

## 2018-12-22 DIAGNOSIS — G5603 Carpal tunnel syndrome, bilateral upper limbs: Secondary | ICD-10-CM | POA: Diagnosis not present

## 2018-12-22 DIAGNOSIS — M13841 Other specified arthritis, right hand: Secondary | ICD-10-CM | POA: Diagnosis not present

## 2018-12-23 ENCOUNTER — Telehealth (HOSPITAL_COMMUNITY): Payer: Self-pay | Admitting: Occupational Therapy

## 2018-12-23 NOTE — Telephone Encounter (Signed)
Left message for pt to follow up on wrist functioning and offer in-clinic appointment. Asked pt to return call to schedule or discharge.    Guadelupe Sabin, OTR/L  [phone] 12/23/2018

## 2018-12-26 ENCOUNTER — Ambulatory Visit (HOSPITAL_COMMUNITY): Payer: PPO

## 2018-12-26 ENCOUNTER — Telehealth (HOSPITAL_COMMUNITY): Payer: Self-pay

## 2018-12-26 NOTE — Telephone Encounter (Signed)
His father passed away in [place] and he will be out of town for a while he will call us to r/s.

## 2019-01-13 ENCOUNTER — Telehealth (HOSPITAL_COMMUNITY): Payer: Self-pay | Admitting: Occupational Therapy

## 2019-01-13 NOTE — Telephone Encounter (Signed)
Pt wants to schedule for more treatments now. He returned from [place], 2023/01/28 where his father died from Covid-19 on  Jan 13, 2023). Can we schedule him now? Magda Paganini will triage and let me know when to schedule this patient.

## 2019-01-25 DIAGNOSIS — I10 Essential (primary) hypertension: Secondary | ICD-10-CM | POA: Diagnosis not present

## 2019-01-25 DIAGNOSIS — E782 Mixed hyperlipidemia: Secondary | ICD-10-CM | POA: Diagnosis not present

## 2019-01-27 DIAGNOSIS — E782 Mixed hyperlipidemia: Secondary | ICD-10-CM | POA: Diagnosis not present

## 2019-01-27 DIAGNOSIS — K648 Other hemorrhoids: Secondary | ICD-10-CM | POA: Diagnosis not present

## 2019-01-27 DIAGNOSIS — M65842 Other synovitis and tenosynovitis, left hand: Secondary | ICD-10-CM | POA: Diagnosis not present

## 2019-01-27 DIAGNOSIS — M65841 Other synovitis and tenosynovitis, right hand: Secondary | ICD-10-CM | POA: Diagnosis not present

## 2019-01-27 DIAGNOSIS — K219 Gastro-esophageal reflux disease without esophagitis: Secondary | ICD-10-CM | POA: Diagnosis not present

## 2019-01-27 DIAGNOSIS — G4700 Insomnia, unspecified: Secondary | ICD-10-CM | POA: Diagnosis not present

## 2019-01-27 DIAGNOSIS — I10 Essential (primary) hypertension: Secondary | ICD-10-CM | POA: Diagnosis not present

## 2019-01-27 DIAGNOSIS — R972 Elevated prostate specific antigen [PSA]: Secondary | ICD-10-CM | POA: Diagnosis not present

## 2019-01-27 DIAGNOSIS — M545 Low back pain: Secondary | ICD-10-CM | POA: Diagnosis not present

## 2019-03-16 ENCOUNTER — Other Ambulatory Visit: Payer: Self-pay

## 2019-03-16 DIAGNOSIS — M13841 Other specified arthritis, right hand: Secondary | ICD-10-CM | POA: Diagnosis not present

## 2019-03-16 DIAGNOSIS — M13842 Other specified arthritis, left hand: Secondary | ICD-10-CM | POA: Diagnosis not present

## 2019-04-10 DIAGNOSIS — Z1283 Encounter for screening for malignant neoplasm of skin: Secondary | ICD-10-CM | POA: Diagnosis not present

## 2019-04-10 DIAGNOSIS — L821 Other seborrheic keratosis: Secondary | ICD-10-CM | POA: Diagnosis not present

## 2019-04-10 DIAGNOSIS — D225 Melanocytic nevi of trunk: Secondary | ICD-10-CM | POA: Diagnosis not present

## 2019-04-10 DIAGNOSIS — D485 Neoplasm of uncertain behavior of skin: Secondary | ICD-10-CM | POA: Diagnosis not present

## 2019-04-10 DIAGNOSIS — L820 Inflamed seborrheic keratosis: Secondary | ICD-10-CM | POA: Diagnosis not present

## 2019-04-17 ENCOUNTER — Ambulatory Visit (INDEPENDENT_AMBULATORY_CARE_PROVIDER_SITE_OTHER): Payer: PPO | Admitting: Otolaryngology

## 2019-04-17 DIAGNOSIS — J310 Chronic rhinitis: Secondary | ICD-10-CM

## 2019-04-17 DIAGNOSIS — J322 Chronic ethmoidal sinusitis: Secondary | ICD-10-CM | POA: Diagnosis not present

## 2019-04-17 DIAGNOSIS — J343 Hypertrophy of nasal turbinates: Secondary | ICD-10-CM | POA: Diagnosis not present

## 2019-04-17 DIAGNOSIS — J321 Chronic frontal sinusitis: Secondary | ICD-10-CM

## 2019-04-20 DIAGNOSIS — D485 Neoplasm of uncertain behavior of skin: Secondary | ICD-10-CM | POA: Diagnosis not present

## 2019-04-20 DIAGNOSIS — L988 Other specified disorders of the skin and subcutaneous tissue: Secondary | ICD-10-CM | POA: Diagnosis not present

## 2019-04-27 DIAGNOSIS — I10 Essential (primary) hypertension: Secondary | ICD-10-CM | POA: Diagnosis not present

## 2019-04-27 DIAGNOSIS — G4700 Insomnia, unspecified: Secondary | ICD-10-CM | POA: Diagnosis not present

## 2019-04-27 DIAGNOSIS — R351 Nocturia: Secondary | ICD-10-CM | POA: Diagnosis not present

## 2019-04-27 DIAGNOSIS — R972 Elevated prostate specific antigen [PSA]: Secondary | ICD-10-CM | POA: Diagnosis not present

## 2019-04-27 DIAGNOSIS — Z23 Encounter for immunization: Secondary | ICD-10-CM | POA: Diagnosis not present

## 2019-04-27 DIAGNOSIS — J321 Chronic frontal sinusitis: Secondary | ICD-10-CM | POA: Diagnosis not present

## 2019-05-15 ENCOUNTER — Ambulatory Visit (INDEPENDENT_AMBULATORY_CARE_PROVIDER_SITE_OTHER): Payer: PPO | Admitting: Otolaryngology

## 2019-05-15 DIAGNOSIS — J322 Chronic ethmoidal sinusitis: Secondary | ICD-10-CM

## 2019-05-15 DIAGNOSIS — J343 Hypertrophy of nasal turbinates: Secondary | ICD-10-CM

## 2019-05-15 DIAGNOSIS — J331 Polypoid sinus degeneration: Secondary | ICD-10-CM

## 2019-05-17 ENCOUNTER — Other Ambulatory Visit: Payer: Self-pay | Admitting: Otolaryngology

## 2019-05-17 ENCOUNTER — Other Ambulatory Visit (HOSPITAL_COMMUNITY): Payer: Self-pay | Admitting: Otolaryngology

## 2019-05-17 DIAGNOSIS — J320 Chronic maxillary sinusitis: Secondary | ICD-10-CM

## 2019-05-23 DIAGNOSIS — E782 Mixed hyperlipidemia: Secondary | ICD-10-CM | POA: Diagnosis not present

## 2019-05-23 DIAGNOSIS — I10 Essential (primary) hypertension: Secondary | ICD-10-CM | POA: Diagnosis not present

## 2019-06-01 ENCOUNTER — Ambulatory Visit (HOSPITAL_COMMUNITY)
Admission: RE | Admit: 2019-06-01 | Discharge: 2019-06-01 | Disposition: A | Discharge status: Home / Self Care | Payer: PPO | Source: Ambulatory Visit | Attending: Otolaryngology | Admitting: Otolaryngology

## 2019-06-01 ENCOUNTER — Other Ambulatory Visit: Payer: Self-pay

## 2019-06-01 DIAGNOSIS — J320 Chronic maxillary sinusitis: Secondary | ICD-10-CM | POA: Diagnosis not present

## 2019-06-01 DIAGNOSIS — J329 Chronic sinusitis, unspecified: Secondary | ICD-10-CM | POA: Diagnosis not present

## 2019-06-26 DIAGNOSIS — D225 Melanocytic nevi of trunk: Secondary | ICD-10-CM | POA: Diagnosis not present

## 2019-06-29 ENCOUNTER — Other Ambulatory Visit: Payer: Self-pay

## 2019-06-29 ENCOUNTER — Ambulatory Visit (INDEPENDENT_AMBULATORY_CARE_PROVIDER_SITE_OTHER): Payer: PPO | Admitting: Otolaryngology

## 2019-07-04 DIAGNOSIS — M79672 Pain in left foot: Secondary | ICD-10-CM | POA: Diagnosis not present

## 2019-07-04 DIAGNOSIS — M79671 Pain in right foot: Secondary | ICD-10-CM | POA: Diagnosis not present

## 2019-07-18 ENCOUNTER — Other Ambulatory Visit: Payer: Self-pay

## 2019-07-18 DIAGNOSIS — Z20822 Contact with and (suspected) exposure to covid-19: Secondary | ICD-10-CM

## 2019-07-23 LAB — NOVEL CORONAVIRUS, NAA: SARS-CoV-2, NAA: NOT DETECTED

## 2019-07-25 ENCOUNTER — Other Ambulatory Visit: Payer: Self-pay | Admitting: Otolaryngology

## 2019-07-28 DIAGNOSIS — M7742 Metatarsalgia, left foot: Secondary | ICD-10-CM | POA: Diagnosis not present

## 2019-07-28 DIAGNOSIS — M79672 Pain in left foot: Secondary | ICD-10-CM | POA: Diagnosis not present

## 2019-07-28 DIAGNOSIS — M7741 Metatarsalgia, right foot: Secondary | ICD-10-CM | POA: Diagnosis not present

## 2019-07-28 DIAGNOSIS — M79671 Pain in right foot: Secondary | ICD-10-CM | POA: Diagnosis not present

## 2019-08-04 ENCOUNTER — Encounter (HOSPITAL_BASED_OUTPATIENT_CLINIC_OR_DEPARTMENT_OTHER): Payer: Self-pay | Admitting: Otolaryngology

## 2019-08-04 ENCOUNTER — Other Ambulatory Visit: Payer: Self-pay

## 2019-08-04 DIAGNOSIS — I10 Essential (primary) hypertension: Secondary | ICD-10-CM | POA: Diagnosis not present

## 2019-08-04 DIAGNOSIS — Z1329 Encounter for screening for other suspected endocrine disorder: Secondary | ICD-10-CM | POA: Diagnosis not present

## 2019-08-04 DIAGNOSIS — E782 Mixed hyperlipidemia: Secondary | ICD-10-CM | POA: Diagnosis not present

## 2019-08-04 DIAGNOSIS — R972 Elevated prostate specific antigen [PSA]: Secondary | ICD-10-CM | POA: Diagnosis not present

## 2019-08-07 DIAGNOSIS — M2041 Other hammer toe(s) (acquired), right foot: Secondary | ICD-10-CM | POA: Diagnosis not present

## 2019-08-07 DIAGNOSIS — I10 Essential (primary) hypertension: Secondary | ICD-10-CM | POA: Diagnosis not present

## 2019-08-07 DIAGNOSIS — R7301 Impaired fasting glucose: Secondary | ICD-10-CM | POA: Diagnosis not present

## 2019-08-07 DIAGNOSIS — M545 Low back pain: Secondary | ICD-10-CM | POA: Diagnosis not present

## 2019-08-07 DIAGNOSIS — M65841 Other synovitis and tenosynovitis, right hand: Secondary | ICD-10-CM | POA: Diagnosis not present

## 2019-08-07 DIAGNOSIS — E782 Mixed hyperlipidemia: Secondary | ICD-10-CM | POA: Diagnosis not present

## 2019-08-07 DIAGNOSIS — J321 Chronic frontal sinusitis: Secondary | ICD-10-CM | POA: Diagnosis not present

## 2019-08-07 DIAGNOSIS — K219 Gastro-esophageal reflux disease without esophagitis: Secondary | ICD-10-CM | POA: Diagnosis not present

## 2019-08-07 DIAGNOSIS — R972 Elevated prostate specific antigen [PSA]: Secondary | ICD-10-CM | POA: Diagnosis not present

## 2019-08-07 DIAGNOSIS — K648 Other hemorrhoids: Secondary | ICD-10-CM | POA: Diagnosis not present

## 2019-08-07 DIAGNOSIS — M65842 Other synovitis and tenosynovitis, left hand: Secondary | ICD-10-CM | POA: Diagnosis not present

## 2019-08-07 DIAGNOSIS — G4700 Insomnia, unspecified: Secondary | ICD-10-CM | POA: Diagnosis not present

## 2019-08-12 ENCOUNTER — Other Ambulatory Visit (HOSPITAL_COMMUNITY)
Admission: RE | Admit: 2019-08-12 | Discharge: 2019-08-12 | Disposition: A | Discharge status: Home / Self Care | Payer: PPO | Source: Ambulatory Visit | Attending: Otolaryngology | Admitting: Otolaryngology

## 2019-08-12 DIAGNOSIS — Z20828 Contact with and (suspected) exposure to other viral communicable diseases: Secondary | ICD-10-CM | POA: Insufficient documentation

## 2019-08-12 DIAGNOSIS — Z01812 Encounter for preprocedural laboratory examination: Secondary | ICD-10-CM | POA: Diagnosis not present

## 2019-08-12 LAB — SARS CORONAVIRUS 2 (TAT 6-24 HRS): SARS Coronavirus 2: NEGATIVE

## 2019-08-14 NOTE — Anesthesia Preprocedure Evaluation (Addendum)
Anesthesia Evaluation  Patient identified by MRN, date of birth, ID band Patient awake    Reviewed: Allergy & Precautions, NPO status , Patient's Chart, lab work & pertinent test results, reviewed documented beta blocker date and time   History of Anesthesia Complications Negative for: history of anesthetic complications  Airway Mallampati: II  TM Distance: >3 FB Neck ROM: Full    Dental  (+) Missing, Dental Advisory Given, Chipped   Pulmonary neg pulmonary ROS,  08/12/2019 SARS coronavirus NEG   breath sounds clear to auscultation       Cardiovascular hypertension, Pt. on medications and Pt. on home beta blockers (-) angina Rhythm:Regular Rate:Normal     Neuro/Psych  Headaches,    GI/Hepatic Neg liver ROS, GERD  Medicated and Controlled,  Endo/Other  negative endocrine ROS  Renal/GU negative Renal ROS     Musculoskeletal   Abdominal   Peds  Hematology negative hematology ROS (+)   Anesthesia Other Findings   Reproductive/Obstetrics                            Anesthesia Physical Anesthesia Plan  ASA: II  Anesthesia Plan: General   Post-op Pain Management:    Induction: Intravenous  PONV Risk Score and Plan: 2 and Ondansetron and Dexamethasone  Airway Management Planned: Oral ETT  Additional Equipment:   Intra-op Plan:   Post-operative Plan: Extubation in OR  Informed Consent: I have reviewed the patients History and Physical, chart, labs and discussed the procedure including the risks, benefits and alternatives for the proposed anesthesia with the patient or authorized representative who has indicated his/her understanding and acceptance.     Dental advisory given  Plan Discussed with: CRNA and Surgeon  Anesthesia Plan Comments:        Anesthesia Quick Evaluation

## 2019-08-15 ENCOUNTER — Encounter (HOSPITAL_BASED_OUTPATIENT_CLINIC_OR_DEPARTMENT_OTHER)
Admission: RE | Disposition: A | Discharge status: Home / Self Care | Payer: Self-pay | Source: Home / Self Care | Attending: Otolaryngology

## 2019-08-15 ENCOUNTER — Encounter (HOSPITAL_BASED_OUTPATIENT_CLINIC_OR_DEPARTMENT_OTHER): Payer: Self-pay | Admitting: Otolaryngology

## 2019-08-15 ENCOUNTER — Ambulatory Visit (HOSPITAL_BASED_OUTPATIENT_CLINIC_OR_DEPARTMENT_OTHER)
Admission: RE | Admit: 2019-08-15 | Discharge: 2019-08-15 | Disposition: A | Discharge status: Home / Self Care | Payer: PPO | Attending: Otolaryngology | Admitting: Otolaryngology

## 2019-08-15 ENCOUNTER — Ambulatory Visit (HOSPITAL_BASED_OUTPATIENT_CLINIC_OR_DEPARTMENT_OTHER): Payer: PPO | Admitting: Anesthesiology

## 2019-08-15 ENCOUNTER — Other Ambulatory Visit: Payer: Self-pay

## 2019-08-15 DIAGNOSIS — J322 Chronic ethmoidal sinusitis: Secondary | ICD-10-CM | POA: Diagnosis not present

## 2019-08-15 DIAGNOSIS — I10 Essential (primary) hypertension: Secondary | ICD-10-CM | POA: Insufficient documentation

## 2019-08-15 DIAGNOSIS — Z7982 Long term (current) use of aspirin: Secondary | ICD-10-CM | POA: Insufficient documentation

## 2019-08-15 DIAGNOSIS — J343 Hypertrophy of nasal turbinates: Secondary | ICD-10-CM | POA: Insufficient documentation

## 2019-08-15 DIAGNOSIS — J338 Other polyp of sinus: Secondary | ICD-10-CM | POA: Insufficient documentation

## 2019-08-15 DIAGNOSIS — E785 Hyperlipidemia, unspecified: Secondary | ICD-10-CM | POA: Diagnosis not present

## 2019-08-15 DIAGNOSIS — J3489 Other specified disorders of nose and nasal sinuses: Secondary | ICD-10-CM | POA: Diagnosis not present

## 2019-08-15 DIAGNOSIS — J324 Chronic pansinusitis: Secondary | ICD-10-CM | POA: Diagnosis not present

## 2019-08-15 DIAGNOSIS — K219 Gastro-esophageal reflux disease without esophagitis: Secondary | ICD-10-CM | POA: Diagnosis not present

## 2019-08-15 DIAGNOSIS — J339 Nasal polyp, unspecified: Secondary | ICD-10-CM | POA: Diagnosis not present

## 2019-08-15 DIAGNOSIS — Z79899 Other long term (current) drug therapy: Secondary | ICD-10-CM | POA: Insufficient documentation

## 2019-08-15 DIAGNOSIS — J321 Chronic frontal sinusitis: Secondary | ICD-10-CM | POA: Diagnosis not present

## 2019-08-15 DIAGNOSIS — Z20828 Contact with and (suspected) exposure to other viral communicable diseases: Secondary | ICD-10-CM | POA: Diagnosis not present

## 2019-08-15 DIAGNOSIS — J320 Chronic maxillary sinusitis: Secondary | ICD-10-CM | POA: Diagnosis not present

## 2019-08-15 DIAGNOSIS — J323 Chronic sphenoidal sinusitis: Secondary | ICD-10-CM | POA: Diagnosis not present

## 2019-08-15 HISTORY — PX: TURBINATE REDUCTION: SHX6157

## 2019-08-15 HISTORY — PX: FRONTAL SINUS EXPLORATION: SHX6591

## 2019-08-15 HISTORY — PX: MAXILLARY ANTROSTOMY: SHX2003

## 2019-08-15 HISTORY — PX: SPHENOIDECTOMY: SHX2421

## 2019-08-15 HISTORY — PX: ETHMOIDECTOMY: SHX5197

## 2019-08-15 HISTORY — PX: SINUS ENDO WITH FUSION: SHX5329

## 2019-08-15 SURGERY — REDUCTION, NASAL TURBINATE
Anesthesia: General | Site: Nose | Laterality: Bilateral

## 2019-08-15 MED ORDER — PHENYLEPHRINE 40 MCG/ML (10ML) SYRINGE FOR IV PUSH (FOR BLOOD PRESSURE SUPPORT)
PREFILLED_SYRINGE | INTRAVENOUS | Status: AC
  Filled 2019-08-15: qty 10

## 2019-08-15 MED ORDER — DEXAMETHASONE SODIUM PHOSPHATE 10 MG/ML IJ SOLN
INTRAMUSCULAR | Status: DC | PRN
  Administered 2019-08-15: 10 mg via INTRAVENOUS

## 2019-08-15 MED ORDER — OXYCODONE-ACETAMINOPHEN 5-325 MG PO TABS: 1.0000 | ORAL_TABLET | ORAL | 0 refills | Status: AC | PRN

## 2019-08-15 MED ORDER — OXYMETAZOLINE HCL 0.05 % NA SOLN
NASAL | Status: AC
  Filled 2019-08-15: qty 30

## 2019-08-15 MED ORDER — LACTATED RINGERS IV SOLN: INTRAVENOUS | Status: DC

## 2019-08-15 MED ORDER — OXYMETAZOLINE HCL 0.05 % NA SOLN
NASAL | Status: DC | PRN
  Administered 2019-08-15: 1 via TOPICAL

## 2019-08-15 MED ORDER — ONDANSETRON HCL 4 MG/2ML IJ SOLN
INTRAMUSCULAR | Status: AC
  Filled 2019-08-15: qty 2

## 2019-08-15 MED ORDER — MUPIROCIN 2 % EX OINT
TOPICAL_OINTMENT | CUTANEOUS | Status: DC | PRN
  Administered 2019-08-15: 1 via NASAL

## 2019-08-15 MED ORDER — EPHEDRINE SULFATE-NACL 50-0.9 MG/10ML-% IV SOSY
PREFILLED_SYRINGE | INTRAVENOUS | Status: DC | PRN
  Administered 2019-08-15 (×3): 10 mg via INTRAVENOUS

## 2019-08-15 MED ORDER — MUPIROCIN 2 % EX OINT
TOPICAL_OINTMENT | CUTANEOUS | Status: AC
  Filled 2019-08-15: qty 22

## 2019-08-15 MED ORDER — FENTANYL CITRATE (PF) 100 MCG/2ML IJ SOLN
INTRAMUSCULAR | Status: DC | PRN
  Administered 2019-08-15 (×2): 50 ug via INTRAVENOUS

## 2019-08-15 MED ORDER — PROPOFOL 10 MG/ML IV BOLUS
INTRAVENOUS | Status: DC | PRN
  Administered 2019-08-15: 200 mg via INTRAVENOUS

## 2019-08-15 MED ORDER — MIDAZOLAM HCL 2 MG/2ML IJ SOLN
INTRAMUSCULAR | Status: AC
  Filled 2019-08-15: qty 2

## 2019-08-15 MED ORDER — ONDANSETRON HCL 4 MG/2ML IJ SOLN
INTRAMUSCULAR | Status: DC | PRN
  Administered 2019-08-15: 4 mg via INTRAVENOUS

## 2019-08-15 MED ORDER — ACETAMINOPHEN 500 MG PO TABS
1000.0000 mg | ORAL_TABLET | Freq: Once | ORAL | Status: AC
  Administered 2019-08-15: 1000 mg via ORAL

## 2019-08-15 MED ORDER — CEFAZOLIN SODIUM 1 G IJ SOLR
INTRAMUSCULAR | Status: AC
  Filled 2019-08-15: qty 20

## 2019-08-15 MED ORDER — ROCURONIUM BROMIDE 50 MG/5ML IV SOSY
PREFILLED_SYRINGE | INTRAVENOUS | Status: DC | PRN
  Administered 2019-08-15: 50 mg via INTRAVENOUS

## 2019-08-15 MED ORDER — DEXAMETHASONE SODIUM PHOSPHATE 10 MG/ML IJ SOLN
INTRAMUSCULAR | Status: AC
  Filled 2019-08-15: qty 1

## 2019-08-15 MED ORDER — MIDAZOLAM HCL 2 MG/2ML IJ SOLN
INTRAMUSCULAR | Status: DC | PRN
  Administered 2019-08-15: 2 mg via INTRAVENOUS

## 2019-08-15 MED ORDER — PROPOFOL 500 MG/50ML IV EMUL
INTRAVENOUS | Status: AC
  Filled 2019-08-15: qty 50

## 2019-08-15 MED ORDER — AMOXICILLIN 875 MG PO TABS: 875.0000 mg | ORAL_TABLET | Freq: Two times a day (BID) | ORAL | 0 refills | Status: AC

## 2019-08-15 MED ORDER — FENTANYL CITRATE (PF) 100 MCG/2ML IJ SOLN
INTRAMUSCULAR | Status: AC
  Filled 2019-08-15: qty 2

## 2019-08-15 MED ORDER — EPHEDRINE 5 MG/ML INJ
INTRAVENOUS | Status: AC
  Filled 2019-08-15: qty 10

## 2019-08-15 MED ORDER — LIDOCAINE 2% (20 MG/ML) 5 ML SYRINGE
INTRAMUSCULAR | Status: DC | PRN
  Administered 2019-08-15: 30 mg via INTRAVENOUS

## 2019-08-15 MED ORDER — PROMETHAZINE HCL 25 MG/ML IJ SOLN: 6.2500 mg | INTRAMUSCULAR | Status: DC | PRN

## 2019-08-15 MED ORDER — CEFAZOLIN SODIUM-DEXTROSE 2-3 GM-%(50ML) IV SOLR
INTRAVENOUS | Status: DC | PRN
  Administered 2019-08-15: 2 g via INTRAVENOUS

## 2019-08-15 MED ORDER — ROCURONIUM BROMIDE 10 MG/ML (PF) SYRINGE
PREFILLED_SYRINGE | INTRAVENOUS | Status: AC
  Filled 2019-08-15: qty 10

## 2019-08-15 MED ORDER — SUGAMMADEX SODIUM 200 MG/2ML IV SOLN
INTRAVENOUS | Status: DC | PRN
  Administered 2019-08-15: 200 mg via INTRAVENOUS

## 2019-08-15 MED ORDER — ACETAMINOPHEN 500 MG PO TABS
ORAL_TABLET | ORAL | Status: AC
  Filled 2019-08-15: qty 2

## 2019-08-15 MED ORDER — MEPERIDINE HCL 25 MG/ML IJ SOLN: 6.2500 mg | INTRAMUSCULAR | Status: DC | PRN

## 2019-08-15 MED ORDER — LIDOCAINE 2% (20 MG/ML) 5 ML SYRINGE
INTRAMUSCULAR | Status: AC
  Filled 2019-08-15: qty 5

## 2019-08-15 MED ORDER — MIDAZOLAM HCL 2 MG/2ML IJ SOLN: 0.5000 mg | Freq: Once | INTRAMUSCULAR | Status: DC | PRN

## 2019-08-15 MED ORDER — HYDROMORPHONE HCL 1 MG/ML IJ SOLN: 0.2500 mg | INTRAMUSCULAR | Status: DC | PRN

## 2019-08-15 MED ORDER — PHENYLEPHRINE 40 MCG/ML (10ML) SYRINGE FOR IV PUSH (FOR BLOOD PRESSURE SUPPORT)
PREFILLED_SYRINGE | INTRAVENOUS | Status: DC | PRN
  Administered 2019-08-15 (×2): 40 ug via INTRAVENOUS
  Administered 2019-08-15: 80 ug via INTRAVENOUS
  Administered 2019-08-15: 40 ug via INTRAVENOUS
  Administered 2019-08-15: 80 ug via INTRAVENOUS
  Administered 2019-08-15: 40 ug via INTRAVENOUS
  Administered 2019-08-15: 80 ug via INTRAVENOUS

## 2019-08-15 SURGICAL SUPPLY — 62 items
ATTRACTOMAT 16X20 MAGNETIC DRP (DRAPES) IMPLANT
BLADE RAD40 ROTATE 4M 4 5PK (BLADE) IMPLANT
BLADE RAD40 ROTATE 4M 4MM 5PK (BLADE)
BLADE RAD60 ROTATE M4 4 5PK (BLADE) IMPLANT
BLADE RAD60 ROTATE M4 4MM 5PK (BLADE)
BLADE ROTATE RAD 12 4 M4 (BLADE) IMPLANT
BLADE ROTATE RAD 12 4MM M4 (BLADE)
BLADE ROTATE RAD 40 4 M4 (BLADE) IMPLANT
BLADE ROTATE RAD 40 4MM M4 (BLADE)
BLADE ROTATE TRICUT 4MX13CM M4 (BLADE) ×1
BLADE ROTATE TRICUT 4X13 M4 (BLADE) ×2 IMPLANT
BLADE TRICUT ROTATE M4 4 5PK (BLADE) ×1 IMPLANT
BLADE TRICUT ROTATE M4 4MM 5PK (BLADE) ×1
BUR HS RAD FRONTAL 3 (BURR) IMPLANT
BUR HS RAD FRONTAL 3MM (BURR)
CANISTER SUC SOCK COL 7IN (MISCELLANEOUS) ×6 IMPLANT
CANISTER SUCT 1200ML W/VALVE (MISCELLANEOUS) ×3 IMPLANT
COAGULATOR SUCT 8FR VV (MISCELLANEOUS) ×2 IMPLANT
COVER WAND RF STERILE (DRAPES) IMPLANT
DECANTER SPIKE VIAL GLASS SM (MISCELLANEOUS) IMPLANT
DRSG NASAL KENNEDY LMNT 8CM (GAUZE/BANDAGES/DRESSINGS) IMPLANT
DRSG NASOPORE 8CM (GAUZE/BANDAGES/DRESSINGS) ×2 IMPLANT
DRSG TELFA 3X8 NADH (GAUZE/BANDAGES/DRESSINGS) IMPLANT
ELECT REM PT RETURN 9FT ADLT (ELECTROSURGICAL) ×3
ELECTRODE REM PT RTRN 9FT ADLT (ELECTROSURGICAL) ×1 IMPLANT
GLOVE BIO SURGEON STRL SZ7.5 (GLOVE) ×3 IMPLANT
GOWN STRL REUS W/ TWL LRG LVL3 (GOWN DISPOSABLE) ×2 IMPLANT
GOWN STRL REUS W/TWL LRG LVL3 (GOWN DISPOSABLE) ×6
HEMOSTAT SURGICEL 2X14 (HEMOSTASIS) IMPLANT
IV NS 1000ML (IV SOLUTION)
IV NS 1000ML BAXH (IV SOLUTION) IMPLANT
IV NS 500ML (IV SOLUTION) ×6
IV NS 500ML BAXH (IV SOLUTION) ×2 IMPLANT
NDL HYPO 25X1 1.5 SAFETY (NEEDLE) ×1 IMPLANT
NDL SPNL 25GX3.5 QUINCKE BL (NEEDLE) IMPLANT
NEEDLE HYPO 25X1 1.5 SAFETY (NEEDLE) IMPLANT
NEEDLE SPNL 25GX3.5 QUINCKE BL (NEEDLE) IMPLANT
NS IRRIG 1000ML POUR BTL (IV SOLUTION) ×3 IMPLANT
PACK BASIN DAY SURGERY FS (CUSTOM PROCEDURE TRAY) ×3 IMPLANT
PACK ENT DAY SURGERY (CUSTOM PROCEDURE TRAY) ×3 IMPLANT
PAD DRESSING TELFA 3X8 NADH (GAUZE/BANDAGES/DRESSINGS) IMPLANT
PATTIES SURGICAL .5 X3 (DISPOSABLE) IMPLANT
SLEEVE SCD COMPRESS KNEE MED (MISCELLANEOUS) ×3 IMPLANT
SOLUTION BUTLER CLEAR DIP (MISCELLANEOUS) ×4 IMPLANT
SPLINT NASAL AIRWAY SILICONE (MISCELLANEOUS) IMPLANT
SPONGE GAUZE 2X2 8PLY STER LF (GAUZE/BANDAGES/DRESSINGS) ×1
SPONGE GAUZE 2X2 8PLY STRL LF (GAUZE/BANDAGES/DRESSINGS) ×2 IMPLANT
SPONGE NEURO XRAY DETECT 1X3 (DISPOSABLE) ×3 IMPLANT
SUCTION FRAZIER HANDLE 10FR (MISCELLANEOUS) ×2
SUCTION TUBE FRAZIER 10FR DISP (MISCELLANEOUS) IMPLANT
SUT CHROMIC 4 0 P 3 18 (SUTURE) IMPLANT
SUT PLAIN 4 0 ~~LOC~~ 1 (SUTURE) IMPLANT
SUT PROLENE 3 0 PS 2 (SUTURE) IMPLANT
SYR 50ML LL SCALE MARK (SYRINGE) ×3 IMPLANT
TOWEL GREEN STERILE FF (TOWEL DISPOSABLE) ×3 IMPLANT
TRACKER ENT INSTRUMENT (MISCELLANEOUS) ×3 IMPLANT
TRACKER ENT PATIENT (MISCELLANEOUS) ×3 IMPLANT
TUBE CONNECTING 20'X1/4 (TUBING) ×1
TUBE CONNECTING 20X1/4 (TUBING) ×2 IMPLANT
TUBE SALEM SUMP 16 FR W/ARV (TUBING) ×2 IMPLANT
TUBING STRAIGHTSHOT EPS 5PK (TUBING) ×3 IMPLANT
YANKAUER SUCT BULB TIP NO VENT (SUCTIONS) ×3 IMPLANT

## 2019-08-15 NOTE — Discharge Instructions (Addendum)
POSTOPERATIVE INSTRUCTIONS FOR PATIENTS HAVING NASAL OR SINUS OPERATIONS ACTIVITY: Restrict activity at home for the first two days, resting as much as possible. Light activity is best. You may usually return to work within a week. You should refrain from nose blowing, strenuous activity, or heavy lifting greater than 20lbs for a total of one week after your operation.  If sneezing cannot be avoided, sneeze with your mouth open. DISCOMFORT: You may experience a dull headache and pressure along with nasal congestion and discharge. These symptoms may be worse during the first week after the operation but may last as long as two to four weeks.  Please take Tylenol or the pain medication that has been prescribed for you. Do not take aspirin or aspirin containing medications since they may cause bleeding.  You may experience symptoms of post nasal drainage, nasal congestion, headaches and fatigue for two or three months after your operation.   1000 mg of tylenol was given at 0700  BLEEDING: You may have some blood tinged nasal drainage for approximately two weeks after the operation.  The discharge will be worse for the first week.  Please call our office at [phone] or go to the nearest hospital emergency room if you experience any of the following: heavy, bright red blood from your nose or mouth that lasts longer than 15 minutes or coughing up or vomiting bright red blood or blood clots. GENERAL CONSIDERATIONS: 1. A gauze dressing will be placed on your upper lip to absorb any drainage after the operation. You may need to change this several times a day.  If you do not have very much drainage, you may remove the dressing.  Remember that you may gently wipe your nose with a tissue and sniff in, but DO NOT blow your nose. 2. Please keep all of your postoperative appointments.  Your final results after the operation will depend on proper follow-up.  The initial visit is usually 2 to 5 days after the  operation.  During this visit, the remaining nasal packing and internal septal splints will be removed.  Your nasal and sinus cavities will be cleaned.  During the second visit, your nasal and sinus cavities will be cleaned again. Have someone drive you to your first two postoperative appointments.  3. How you care for your nose after the operation will influence the results that you obtain.  You should follow all directions, take your medication as prescribed, and call our office [phone] with any problems or questions. 4. You may be more comfortable sleeping with your head elevated on two pillows. 5. Do not take any medications that we have not prescribed or recommended. WARNING SIGNS: if any of the following should occur, please call our office: 1. Persistent fever greater than 102F. 2. Persistent vomiting. 3. Severe and constant pain that is not relieved by prescribed pain medication. 4. Trauma to the nose. 5. Rash or unusual side effects from any medicines.

## 2019-08-15 NOTE — Anesthesia Procedure Notes (Signed)
Procedure Name: Intubation Date/Time: 08/15/2019 7:42 AM Performed by: Genelle Bal, CRNA Pre-anesthesia Checklist: Patient identified, Emergency Drugs available, Suction available and Patient being monitored Patient Re-evaluated:Patient Re-evaluated prior to induction Oxygen Delivery Method: Circle system utilized Preoxygenation: Pre-oxygenation with 100% oxygen Induction Type: IV induction Ventilation: Mask ventilation without difficulty Laryngoscope Size: Miller and 2 Grade View: Grade I Tube type: Oral Tube size: 8.0 mm Number of attempts: 1 Airway Equipment and Method: Stylet and Oral airway Placement Confirmation: ETT inserted through vocal cords under direct vision,  positive ETCO2 and breath sounds checked- equal and bilateral Secured at: 22 cm Tube secured with: Tape Dental Injury: Teeth and Oropharynx as per pre-operative assessment

## 2019-08-15 NOTE — H&P (Signed)
Cc: Chronic rhinosinusitis and nasal obstruction  HPI: The patient is a 68 year old male who returns today for follow-up evaluation. of his chronic rhinosinusitis and recurrent headaches.  The patient was last seen in 04/2019.  At that time, he was noted to have significant nasal mucosal congestion, bilateral inferior turbinate hypertrophy, and chronic rhinosinusitis.  He was previously treated with multiple antibiotics and high dose steroids.  Despite treatment, he continues to complain of persistent nasal obstruction, facial pain and headache.  He is on Flonase nasal spray daily.  He recently underwent a sinus CT scan.  The CT showed diffuse mucosal edema, involving all paranasal sinuses.  The mucosal inflammation is worse at the frontal and ethmoid sinuses.  The patient returns today complaining of persistent headache and facial pain.  He is also chronically congested.   Exam: General: Communicates without difficulty, well nourished, no acute distress. Head: Normocephalic, no evidence injury, no tenderness, facial buttresses intact without stepoff. Eyes: PERRL, EOMI. No scleral icterus, conjunctivae clear. Neuro: CN II exam reveals vision grossly intact.  No nystagmus at any point of gaze. Ears: Auricles well formed without lesions.  Ear canals are intact without mass or lesion.  No erythema or edema is appreciated.  The TMs are intact without fluid. Nose: External evaluation reveals normal support and skin without lesions.  Dorsum is intact.  Anterior rhinoscopy reveals congested and edematous mucosa over anterior aspect of the inferior turbinates and nasal septum.  Oral:  Oral cavity and oropharynx are intact, symmetric, without erythema or edema.  Mucosa is moist without lesions. Neck: Full range of motion without pain.  There is no significant lymphadenopathy.  No masses palpable.  Thyroid bed within normal limits to palpation.  Parotid glands and submandibular glands equal bilaterally without mass.   Trachea is midline. Neuro:  CN 2-12 grossly intact. Gait normal. Vestibular: No nystagmus at any point of gaze.   Assessment 1.  Bilateral chronic rhinosinusitis involving all 4 pairs of paranasal sinuses.  His sinus disease is worse in the frontal and ethmoid sinuses.  2.  Bilateral inferior turbinate hypertrophy, causing significant nasal congestion.   3.  The patient has not responded to multiple courses of extended antibiotics and high dose steroids.   Plan  1.  The physical exam findings and the CT images are reviewed with the patient.  2.  The patient should continue with his daily Flonase nasal spray.   3.  In light of his persistent symptoms, he will benefit from surgical intervention with bilateral inferior turbinate reduction and bilateral endoscopic sinus surgery.  The risks, benefits, and details of the procedure are reviewed with the patient.  4.  The patient would like to proceed with the procedures.

## 2019-08-15 NOTE — Anesthesia Postprocedure Evaluation (Signed)
Anesthesia Post Note  Patient: Wisdom Chavira  Procedure(s) Performed: TURBINATE RECESSION (Bilateral Nose) MAXILLARY ANTROSTOMY WITH TISSUE REMOVAL (Bilateral Nose) FRONTAL SINUS EXPLORATION (Bilateral Nose) ETHMOIDECTOMY (Bilateral Nose) SPHENOIDECTOMY (Bilateral Nose) SINUS ENDO WITH FUSION (Bilateral Nose)     Patient location during evaluation: PACU Anesthesia Type: General Level of consciousness: awake and alert, patient cooperative and oriented Pain management: pain level controlled Vital Signs Assessment: post-procedure vital signs reviewed and stable Respiratory status: spontaneous breathing, nonlabored ventilation and respiratory function stable Cardiovascular status: blood pressure returned to baseline and stable Postop Assessment: no apparent nausea or vomiting and adequate PO intake Anesthetic complications: no    Last Vitals:  Vitals:   08/15/19 1000 08/15/19 1015  BP: 123/76 120/73  Pulse: 74 71  Resp: 17 14  Temp:    SpO2: 93% 94%    Last Pain:  Vitals:   08/15/19 1000  TempSrc:   PainSc: 0-No pain                 Alexandr Oehler,E. Codie Krogh

## 2019-08-15 NOTE — Transfer of Care (Signed)
Immediate Anesthesia Transfer of Care Note  Patient: [name]  Procedure(s) Performed: TURBINATE RECESSION (Bilateral Nose) MAXILLARY ANTROSTOMY WITH TISSUE REMOVAL (Bilateral Nose) FRONTAL SINUS EXPLORATION (Bilateral Nose) ETHMOIDECTOMY (Bilateral Nose) SPHENOIDECTOMY (Bilateral Nose) SINUS ENDO WITH FUSION (Bilateral Nose)  Patient Location: PACU  Anesthesia Type:General  Level of Consciousness: awake, alert  and oriented  Airway & Oxygen Therapy: Patient Spontanous Breathing and Patient connected to face mask oxygen  Post-op Assessment: Report given to RN and Post -op Vital signs reviewed and stable  Post vital signs: Reviewed and stable  Last Vitals:  Vitals Value Taken Time  BP 126/71 08/15/19 0930  Temp    Pulse 71 08/15/19 0930  Resp 15 08/15/19 0930  SpO2 96 % 08/15/19 0930  Vitals shown include unvalidated device data.  Last Pain:  Vitals:   08/15/19 0705  TempSrc: Oral  PainSc: 0-No pain      Patients Stated Pain Goal: 3 ([id number])  Complications: No apparent anesthesia complications

## 2019-08-15 NOTE — Op Note (Signed)
DATE OF PROCEDURE: 08/15/2019  OPERATIVE REPORT   SURGEON: [name], MD   PREOPERATIVE DIAGNOSES:  1. Bilateral chronic pansinusitis and polyposis. 2. Bilateral inferior turbinate hypertrophy.  3. Chronic nasal obstruction.  POSTOPERATIVE DIAGNOSES:  1. Bilateral chronic pansinusitis and polyposis. 2. Bilateral inferior turbinate hypertrophy.  3. Chronic nasal obstruction.  PROCEDURE PERFORMED:  1. Bilateral endoscopic frontal sinusotomy and polyp removal. 2. Bilateral endoscopic total ethmoidectomy and sphenoidotomy. 3. Bilateral endoscopic maxillary antrostomy with polyp removal. 4. Bilateral partial inferior turbinate resection.  5. FUSION stereotactic image guidance.  ANESTHESIA: General endotracheal tube anesthesia.   COMPLICATIONS: None.   ESTIMATED BLOOD LOSS: 250 mL.   INDICATION FOR PROCEDURE: [name] is a 68 y.o. male with a history of chronic nasal obstruction, bilateral inferior turbinate hypertrophy, and chronic rhinosinusitis.  He was previously treated with multiple antibiotics and high dose steroids.  Despite treatment, he continued to complain of persistent nasal obstruction, facial pain and headache.  He was on Flonase nasal spray daily.  He recently underwent a sinus CT scan.  The CT showed diffuse mucosal edema, involving all paranasal sinuses.  The mucosal inflammation was worse at the frontal and ethmoid sinuses. The patient was treated with antihistamine, decongestant, steroid nasal spray, and systemic steroids. However, the patient continued to be symptomatic. Based on the above findings, the decision was made for the patient to undergo the above-stated procedures. The risks, benefits, alternatives, and details of the procedures were discussed with the patient. Questions were invited and answered. Informed consent was obtained.   DESCRIPTION OF PROCEDURE: The patient was taken to the operating room and placed supine on the operating table. General  endotracheal tube anesthesia was administered by the anesthesiologist. The patient was positioned, and prepped and draped in the standard fashion for nasal surgery. Pledgets soaked with Afrin were placed in both nasal cavities for decongestion. The pledgets were subsequently removed. The FUSION stereotactic image guidance marker was placed. The image guidance system was functional throughout the case.  The inferior one half of both hypertrophied inferior turbinate was crossclamped with a Kelly clamp. The inferior one half of each inferior turbinate was then resected with a pair of cross cutting scissors. Hemostasis was achieved with a suction cautery device.   Using a 0 endoscope, the left nasal cavity was examined. A large middle turbinate was noted. Using Tru-Cut forceps, the inferior one third of the middle turbinate was resected. Polypoid tissue was noted within the middle meatus. The polypoid tissue was removed using a combination of microdebrider and Blakesley forceps. The uncinate process was resected with a freer elevator. The maxillary antrum was entered and enlarged using a combination of backbiter and microdebrider. Polypoid tissue was removed from the left maxillary sinus.  Attention was then focused on the ethmoid sinuses. The bony partitions of the anterior and posterior ethmoid cavities were taken down. Polypoid tissue was noted and removed.  More polyps were noted to obstruct the left sphenoid opening. The polyps were removed. The sphenoid opening was entered and enlarged. No pathology was noted within the sphenoid sinus. Attention was then focused on the frontal sinus. The frontal recess was identified and enlarged by removing the surrounding bony partitions. Polypoid tissue was removed from the frontal recess. All 4 paranasal sinuses were copiously irrigated with saline solution.  The same procedure was repeated on the right side without exception. More polyps were noted on the right side.  All polyps were removed.   The care of the patient was turned over to  the anesthesiologist. The patient was awakened from anesthesia without difficulty. The patient was extubated and transferred to the recovery room in good condition.   OPERATIVE FINDINGS: Bilateral chronic pansinusitis and polyposis. Bilateral inferior turbinate hypertrophy.   SPECIMEN: Bilateral sinus contents.  FOLLOWUP CARE: The patient be discharged home once he is awake and alert. The patient will be placed on Percocet 1 tablets p.o. q.4 hours p.r.n. pain, and amoxicillin 875 mg p.o. b.i.d. for 5 days. The patient will follow up in my office in approximately 1 week for sinus debridement.  Darrell Raczka Raynelle Bring, MD

## 2019-08-16 LAB — SURGICAL PATHOLOGY

## 2019-08-21 DIAGNOSIS — J322 Chronic ethmoidal sinusitis: Secondary | ICD-10-CM | POA: Diagnosis not present

## 2019-08-21 DIAGNOSIS — J321 Chronic frontal sinusitis: Secondary | ICD-10-CM | POA: Diagnosis not present

## 2019-08-21 DIAGNOSIS — J338 Other polyp of sinus: Secondary | ICD-10-CM | POA: Diagnosis not present

## 2019-08-21 DIAGNOSIS — J320 Chronic maxillary sinusitis: Secondary | ICD-10-CM | POA: Diagnosis not present

## 2019-09-01 DIAGNOSIS — J321 Chronic frontal sinusitis: Secondary | ICD-10-CM | POA: Diagnosis not present

## 2019-09-01 DIAGNOSIS — M65841 Other synovitis and tenosynovitis, right hand: Secondary | ICD-10-CM | POA: Diagnosis not present

## 2019-09-01 DIAGNOSIS — E782 Mixed hyperlipidemia: Secondary | ICD-10-CM | POA: Diagnosis not present

## 2019-09-01 DIAGNOSIS — K648 Other hemorrhoids: Secondary | ICD-10-CM | POA: Diagnosis not present

## 2019-09-01 DIAGNOSIS — I10 Essential (primary) hypertension: Secondary | ICD-10-CM | POA: Diagnosis not present

## 2019-09-01 DIAGNOSIS — G4700 Insomnia, unspecified: Secondary | ICD-10-CM | POA: Diagnosis not present

## 2019-09-01 DIAGNOSIS — M545 Low back pain: Secondary | ICD-10-CM | POA: Diagnosis not present

## 2019-09-01 DIAGNOSIS — R7301 Impaired fasting glucose: Secondary | ICD-10-CM | POA: Diagnosis not present

## 2019-09-01 DIAGNOSIS — R972 Elevated prostate specific antigen [PSA]: Secondary | ICD-10-CM | POA: Diagnosis not present

## 2019-09-01 DIAGNOSIS — M65842 Other synovitis and tenosynovitis, left hand: Secondary | ICD-10-CM | POA: Diagnosis not present

## 2019-09-01 DIAGNOSIS — M2041 Other hammer toe(s) (acquired), right foot: Secondary | ICD-10-CM | POA: Diagnosis not present

## 2019-09-01 DIAGNOSIS — K219 Gastro-esophageal reflux disease without esophagitis: Secondary | ICD-10-CM | POA: Diagnosis not present

## 2019-09-04 DIAGNOSIS — J322 Chronic ethmoidal sinusitis: Secondary | ICD-10-CM | POA: Diagnosis not present

## 2019-09-04 DIAGNOSIS — J321 Chronic frontal sinusitis: Secondary | ICD-10-CM | POA: Diagnosis not present

## 2019-09-04 DIAGNOSIS — J338 Other polyp of sinus: Secondary | ICD-10-CM | POA: Diagnosis not present

## 2019-09-04 DIAGNOSIS — J320 Chronic maxillary sinusitis: Secondary | ICD-10-CM | POA: Diagnosis not present

## 2019-10-19 DIAGNOSIS — L905 Scar conditions and fibrosis of skin: Secondary | ICD-10-CM | POA: Diagnosis not present

## 2019-10-19 DIAGNOSIS — D225 Melanocytic nevi of trunk: Secondary | ICD-10-CM | POA: Diagnosis not present

## 2019-10-19 DIAGNOSIS — Z1283 Encounter for screening for malignant neoplasm of skin: Secondary | ICD-10-CM | POA: Diagnosis not present

## 2019-10-19 DIAGNOSIS — D485 Neoplasm of uncertain behavior of skin: Secondary | ICD-10-CM | POA: Diagnosis not present

## 2019-10-23 DIAGNOSIS — M7742 Metatarsalgia, left foot: Secondary | ICD-10-CM | POA: Diagnosis not present

## 2019-10-23 DIAGNOSIS — M7741 Metatarsalgia, right foot: Secondary | ICD-10-CM | POA: Diagnosis not present

## 2019-10-23 DIAGNOSIS — M2041 Other hammer toe(s) (acquired), right foot: Secondary | ICD-10-CM | POA: Diagnosis not present

## 2019-10-26 DIAGNOSIS — D485 Neoplasm of uncertain behavior of skin: Secondary | ICD-10-CM | POA: Diagnosis not present

## 2019-10-26 DIAGNOSIS — L821 Other seborrheic keratosis: Secondary | ICD-10-CM | POA: Diagnosis not present

## 2019-10-26 DIAGNOSIS — D225 Melanocytic nevi of trunk: Secondary | ICD-10-CM | POA: Diagnosis not present

## 2019-12-11 DIAGNOSIS — E782 Mixed hyperlipidemia: Secondary | ICD-10-CM | POA: Diagnosis not present

## 2019-12-11 DIAGNOSIS — K219 Gastro-esophageal reflux disease without esophagitis: Secondary | ICD-10-CM | POA: Diagnosis not present

## 2019-12-11 DIAGNOSIS — I10 Essential (primary) hypertension: Secondary | ICD-10-CM | POA: Diagnosis not present

## 2019-12-15 DIAGNOSIS — R0982 Postnasal drip: Secondary | ICD-10-CM | POA: Diagnosis not present

## 2019-12-15 DIAGNOSIS — J310 Chronic rhinitis: Secondary | ICD-10-CM | POA: Diagnosis not present

## 2020-02-07 DIAGNOSIS — E782 Mixed hyperlipidemia: Secondary | ICD-10-CM | POA: Diagnosis not present

## 2020-02-07 DIAGNOSIS — J321 Chronic frontal sinusitis: Secondary | ICD-10-CM | POA: Diagnosis not present

## 2020-02-07 DIAGNOSIS — K648 Other hemorrhoids: Secondary | ICD-10-CM | POA: Diagnosis not present

## 2020-02-07 DIAGNOSIS — M2042 Other hammer toe(s) (acquired), left foot: Secondary | ICD-10-CM | POA: Diagnosis not present

## 2020-02-07 DIAGNOSIS — M545 Low back pain: Secondary | ICD-10-CM | POA: Diagnosis not present

## 2020-02-07 DIAGNOSIS — K219 Gastro-esophageal reflux disease without esophagitis: Secondary | ICD-10-CM | POA: Diagnosis not present

## 2020-02-07 DIAGNOSIS — G4700 Insomnia, unspecified: Secondary | ICD-10-CM | POA: Diagnosis not present

## 2020-02-07 DIAGNOSIS — H93A9 Pulsatile tinnitus, unspecified ear: Secondary | ICD-10-CM | POA: Diagnosis not present

## 2020-02-07 DIAGNOSIS — M2041 Other hammer toe(s) (acquired), right foot: Secondary | ICD-10-CM | POA: Diagnosis not present

## 2020-02-07 DIAGNOSIS — G4489 Other headache syndrome: Secondary | ICD-10-CM | POA: Diagnosis not present

## 2020-02-07 DIAGNOSIS — I10 Essential (primary) hypertension: Secondary | ICD-10-CM | POA: Diagnosis not present

## 2020-02-07 DIAGNOSIS — M5136 Other intervertebral disc degeneration, lumbar region: Secondary | ICD-10-CM | POA: Diagnosis not present

## 2020-02-12 DIAGNOSIS — I10 Essential (primary) hypertension: Secondary | ICD-10-CM | POA: Diagnosis not present

## 2020-02-12 DIAGNOSIS — R972 Elevated prostate specific antigen [PSA]: Secondary | ICD-10-CM | POA: Diagnosis not present

## 2020-02-12 DIAGNOSIS — K219 Gastro-esophageal reflux disease without esophagitis: Secondary | ICD-10-CM | POA: Diagnosis not present

## 2020-02-12 DIAGNOSIS — R7301 Impaired fasting glucose: Secondary | ICD-10-CM | POA: Diagnosis not present

## 2020-02-12 DIAGNOSIS — M545 Low back pain: Secondary | ICD-10-CM | POA: Diagnosis not present

## 2020-02-12 DIAGNOSIS — M65841 Other synovitis and tenosynovitis, right hand: Secondary | ICD-10-CM | POA: Diagnosis not present

## 2020-02-12 DIAGNOSIS — G4700 Insomnia, unspecified: Secondary | ICD-10-CM | POA: Diagnosis not present

## 2020-02-12 DIAGNOSIS — K648 Other hemorrhoids: Secondary | ICD-10-CM | POA: Diagnosis not present

## 2020-02-12 DIAGNOSIS — M65842 Other synovitis and tenosynovitis, left hand: Secondary | ICD-10-CM | POA: Diagnosis not present

## 2020-02-12 DIAGNOSIS — J321 Chronic frontal sinusitis: Secondary | ICD-10-CM | POA: Diagnosis not present

## 2020-02-12 DIAGNOSIS — E782 Mixed hyperlipidemia: Secondary | ICD-10-CM | POA: Diagnosis not present

## 2020-02-12 DIAGNOSIS — Z0001 Encounter for general adult medical examination with abnormal findings: Secondary | ICD-10-CM | POA: Diagnosis not present

## 2020-03-05 DIAGNOSIS — M7741 Metatarsalgia, right foot: Secondary | ICD-10-CM | POA: Diagnosis not present

## 2020-03-05 DIAGNOSIS — M21611 Bunion of right foot: Secondary | ICD-10-CM | POA: Diagnosis not present

## 2020-03-06 DIAGNOSIS — I10 Essential (primary) hypertension: Secondary | ICD-10-CM | POA: Diagnosis not present

## 2020-03-06 DIAGNOSIS — K219 Gastro-esophageal reflux disease without esophagitis: Secondary | ICD-10-CM | POA: Diagnosis not present

## 2020-03-06 DIAGNOSIS — R972 Elevated prostate specific antigen [PSA]: Secondary | ICD-10-CM | POA: Diagnosis not present

## 2020-03-06 DIAGNOSIS — E782 Mixed hyperlipidemia: Secondary | ICD-10-CM | POA: Diagnosis not present

## 2020-03-06 DIAGNOSIS — R7301 Impaired fasting glucose: Secondary | ICD-10-CM | POA: Diagnosis not present

## 2020-03-06 DIAGNOSIS — K648 Other hemorrhoids: Secondary | ICD-10-CM | POA: Diagnosis not present

## 2020-03-06 DIAGNOSIS — Z0001 Encounter for general adult medical examination with abnormal findings: Secondary | ICD-10-CM | POA: Diagnosis not present

## 2020-03-06 DIAGNOSIS — G4700 Insomnia, unspecified: Secondary | ICD-10-CM | POA: Diagnosis not present

## 2020-03-06 DIAGNOSIS — M65841 Other synovitis and tenosynovitis, right hand: Secondary | ICD-10-CM | POA: Diagnosis not present

## 2020-03-06 DIAGNOSIS — M65842 Other synovitis and tenosynovitis, left hand: Secondary | ICD-10-CM | POA: Diagnosis not present

## 2020-03-06 DIAGNOSIS — M545 Low back pain: Secondary | ICD-10-CM | POA: Diagnosis not present

## 2020-03-06 DIAGNOSIS — J321 Chronic frontal sinusitis: Secondary | ICD-10-CM | POA: Diagnosis not present

## 2020-03-12 ENCOUNTER — Other Ambulatory Visit (HOSPITAL_COMMUNITY): Payer: Self-pay | Admitting: Orthopedic Surgery

## 2020-04-05 ENCOUNTER — Other Ambulatory Visit: Payer: Self-pay

## 2020-04-05 ENCOUNTER — Encounter (HOSPITAL_BASED_OUTPATIENT_CLINIC_OR_DEPARTMENT_OTHER): Payer: Self-pay | Admitting: Orthopedic Surgery

## 2020-04-08 ENCOUNTER — Other Ambulatory Visit (HOSPITAL_COMMUNITY)
Admission: RE | Admit: 2020-04-08 | Discharge: 2020-04-08 | Disposition: A | Discharge status: Home / Self Care | Payer: PPO | Source: Ambulatory Visit | Attending: Orthopedic Surgery | Admitting: Orthopedic Surgery

## 2020-04-08 ENCOUNTER — Encounter (HOSPITAL_BASED_OUTPATIENT_CLINIC_OR_DEPARTMENT_OTHER)
Admission: RE | Admit: 2020-04-08 | Discharge: 2020-04-08 | Disposition: A | Discharge status: Home / Self Care | Payer: PPO | Source: Ambulatory Visit | Attending: Orthopedic Surgery | Admitting: Orthopedic Surgery

## 2020-04-08 DIAGNOSIS — Z01812 Encounter for preprocedural laboratory examination: Secondary | ICD-10-CM | POA: Insufficient documentation

## 2020-04-08 DIAGNOSIS — Z01818 Encounter for other preprocedural examination: Secondary | ICD-10-CM | POA: Diagnosis not present

## 2020-04-08 DIAGNOSIS — Z20822 Contact with and (suspected) exposure to covid-19: Secondary | ICD-10-CM | POA: Insufficient documentation

## 2020-04-08 LAB — SARS CORONAVIRUS 2 (TAT 6-24 HRS): SARS Coronavirus 2: NEGATIVE

## 2020-04-08 NOTE — Progress Notes (Signed)

## 2020-04-11 ENCOUNTER — Encounter (HOSPITAL_BASED_OUTPATIENT_CLINIC_OR_DEPARTMENT_OTHER)
Admission: RE | Disposition: A | Discharge status: Home / Self Care | Payer: Self-pay | Source: Home / Self Care | Attending: Orthopedic Surgery

## 2020-04-11 ENCOUNTER — Other Ambulatory Visit: Payer: Self-pay

## 2020-04-11 ENCOUNTER — Ambulatory Visit (HOSPITAL_BASED_OUTPATIENT_CLINIC_OR_DEPARTMENT_OTHER): Payer: PPO | Admitting: Anesthesiology

## 2020-04-11 ENCOUNTER — Encounter (HOSPITAL_BASED_OUTPATIENT_CLINIC_OR_DEPARTMENT_OTHER): Payer: Self-pay | Admitting: Orthopedic Surgery

## 2020-04-11 ENCOUNTER — Ambulatory Visit (HOSPITAL_BASED_OUTPATIENT_CLINIC_OR_DEPARTMENT_OTHER)
Admission: RE | Admit: 2020-04-11 | Discharge: 2020-04-11 | Disposition: A | Discharge status: Home / Self Care | Payer: PPO | Attending: Orthopedic Surgery | Admitting: Orthopedic Surgery

## 2020-04-11 DIAGNOSIS — Z7982 Long term (current) use of aspirin: Secondary | ICD-10-CM | POA: Insufficient documentation

## 2020-04-11 DIAGNOSIS — Z87891 Personal history of nicotine dependence: Secondary | ICD-10-CM | POA: Diagnosis not present

## 2020-04-11 DIAGNOSIS — Z885 Allergy status to narcotic agent status: Secondary | ICD-10-CM | POA: Insufficient documentation

## 2020-04-11 DIAGNOSIS — G8918 Other acute postprocedural pain: Secondary | ICD-10-CM | POA: Diagnosis not present

## 2020-04-11 DIAGNOSIS — Z8249 Family history of ischemic heart disease and other diseases of the circulatory system: Secondary | ICD-10-CM | POA: Diagnosis not present

## 2020-04-11 DIAGNOSIS — K219 Gastro-esophageal reflux disease without esophagitis: Secondary | ICD-10-CM | POA: Diagnosis not present

## 2020-04-11 DIAGNOSIS — M21611 Bunion of right foot: Secondary | ICD-10-CM | POA: Diagnosis not present

## 2020-04-11 DIAGNOSIS — M7741 Metatarsalgia, right foot: Secondary | ICD-10-CM | POA: Diagnosis not present

## 2020-04-11 DIAGNOSIS — F419 Anxiety disorder, unspecified: Secondary | ICD-10-CM | POA: Insufficient documentation

## 2020-04-11 DIAGNOSIS — Z91030 Bee allergy status: Secondary | ICD-10-CM | POA: Diagnosis not present

## 2020-04-11 DIAGNOSIS — E785 Hyperlipidemia, unspecified: Secondary | ICD-10-CM | POA: Insufficient documentation

## 2020-04-11 DIAGNOSIS — I10 Essential (primary) hypertension: Secondary | ICD-10-CM | POA: Diagnosis not present

## 2020-04-11 DIAGNOSIS — M2041 Other hammer toe(s) (acquired), right foot: Secondary | ICD-10-CM | POA: Insufficient documentation

## 2020-04-11 DIAGNOSIS — Z79899 Other long term (current) drug therapy: Secondary | ICD-10-CM | POA: Insufficient documentation

## 2020-04-11 HISTORY — PX: BUNIONECTOMY WITH WEIL OSTEOTOMY: SHX5604

## 2020-04-11 SURGERY — BUNIONECTOMY WITH WEIL OSTEOTOMY
Anesthesia: General | Site: Foot | Laterality: Right

## 2020-04-11 MED ORDER — PROMETHAZINE HCL 25 MG/ML IJ SOLN: 6.2500 mg | INTRAMUSCULAR | Status: DC | PRN

## 2020-04-11 MED ORDER — ONDANSETRON HCL 4 MG/2ML IJ SOLN
INTRAMUSCULAR | Status: DC | PRN
  Administered 2020-04-11: 4 mg via INTRAVENOUS

## 2020-04-11 MED ORDER — FENTANYL CITRATE (PF) 100 MCG/2ML IJ SOLN
INTRAMUSCULAR | Status: AC
  Filled 2020-04-11: qty 2

## 2020-04-11 MED ORDER — OXYCODONE HCL 5 MG PO TABS: 5.0000 mg | ORAL_TABLET | Freq: Once | ORAL | Status: DC | PRN

## 2020-04-11 MED ORDER — LIDOCAINE HCL (CARDIAC) PF 100 MG/5ML IV SOSY
PREFILLED_SYRINGE | INTRAVENOUS | Status: DC | PRN
  Administered 2020-04-11: 60 mg via INTRAVENOUS

## 2020-04-11 MED ORDER — FENTANYL CITRATE (PF) 100 MCG/2ML IJ SOLN
100.0000 ug | Freq: Once | INTRAMUSCULAR | Status: AC
  Administered 2020-04-11: 100 ug via INTRAVENOUS

## 2020-04-11 MED ORDER — ROPIVACAINE HCL 5 MG/ML IJ SOLN
INTRAMUSCULAR | Status: DC | PRN
  Administered 2020-04-11: 30 mL via PERINEURAL

## 2020-04-11 MED ORDER — AMISULPRIDE (ANTIEMETIC) 5 MG/2ML IV SOLN: 10.0000 mg | Freq: Once | INTRAVENOUS | Status: DC | PRN

## 2020-04-11 MED ORDER — SENNA 8.6 MG PO TABS: 2.0000 | ORAL_TABLET | Freq: Two times a day (BID) | ORAL | 0 refills | Status: DC

## 2020-04-11 MED ORDER — PROPOFOL 10 MG/ML IV BOLUS
INTRAVENOUS | Status: DC | PRN
  Administered 2020-04-11: 200 mg via INTRAVENOUS

## 2020-04-11 MED ORDER — BUPIVACAINE HCL (PF) 0.25 % IJ SOLN
INTRAMUSCULAR | Status: AC
  Filled 2020-04-11: qty 30

## 2020-04-11 MED ORDER — MIDAZOLAM HCL 2 MG/2ML IJ SOLN
2.0000 mg | Freq: Once | INTRAMUSCULAR | Status: AC
  Administered 2020-04-11: 2 mg via INTRAVENOUS

## 2020-04-11 MED ORDER — HYDROMORPHONE HCL 1 MG/ML IJ SOLN: 0.2500 mg | INTRAMUSCULAR | Status: DC | PRN

## 2020-04-11 MED ORDER — MEPERIDINE HCL 25 MG/ML IJ SOLN: 6.2500 mg | INTRAMUSCULAR | Status: DC | PRN

## 2020-04-11 MED ORDER — DEXAMETHASONE SODIUM PHOSPHATE 10 MG/ML IJ SOLN
INTRAMUSCULAR | Status: AC
  Filled 2020-04-11: qty 1

## 2020-04-11 MED ORDER — PROPOFOL 10 MG/ML IV BOLUS
INTRAVENOUS | Status: AC
  Filled 2020-04-11: qty 20

## 2020-04-11 MED ORDER — LIDOCAINE 2% (20 MG/ML) 5 ML SYRINGE
INTRAMUSCULAR | Status: AC
  Filled 2020-04-11: qty 5

## 2020-04-11 MED ORDER — BUPIVACAINE HCL (PF) 0.5 % IJ SOLN
INTRAMUSCULAR | Status: AC
  Filled 2020-04-11: qty 30

## 2020-04-11 MED ORDER — EPHEDRINE 5 MG/ML INJ
INTRAVENOUS | Status: AC
  Filled 2020-04-11: qty 10

## 2020-04-11 MED ORDER — OXYCODONE HCL 5 MG PO TABS: 5.0000 mg | ORAL_TABLET | ORAL | 0 refills | Status: AC | PRN

## 2020-04-11 MED ORDER — CEFAZOLIN SODIUM-DEXTROSE 2-4 GM/100ML-% IV SOLN
INTRAVENOUS | Status: AC
  Filled 2020-04-11: qty 100

## 2020-04-11 MED ORDER — ONDANSETRON HCL 4 MG/2ML IJ SOLN
INTRAMUSCULAR | Status: AC
  Filled 2020-04-11: qty 2

## 2020-04-11 MED ORDER — MIDAZOLAM HCL 2 MG/2ML IJ SOLN
INTRAMUSCULAR | Status: AC
  Filled 2020-04-11: qty 2

## 2020-04-11 MED ORDER — LACTATED RINGERS IV SOLN: INTRAVENOUS | Status: DC

## 2020-04-11 MED ORDER — 0.9 % SODIUM CHLORIDE (POUR BTL) OPTIME
TOPICAL | Status: DC | PRN
  Administered 2020-04-11: 20 mL

## 2020-04-11 MED ORDER — OXYCODONE HCL 5 MG/5ML PO SOLN: 5.0000 mg | Freq: Once | ORAL | Status: DC | PRN

## 2020-04-11 MED ORDER — DEXAMETHASONE SODIUM PHOSPHATE 10 MG/ML IJ SOLN
INTRAMUSCULAR | Status: DC | PRN
  Administered 2020-04-11: 5 mg via INTRAVENOUS

## 2020-04-11 MED ORDER — VANCOMYCIN HCL 500 MG IV SOLR
INTRAVENOUS | Status: AC
  Filled 2020-04-11: qty 500

## 2020-04-11 MED ORDER — EPHEDRINE SULFATE 50 MG/ML IJ SOLN
INTRAMUSCULAR | Status: DC | PRN
  Administered 2020-04-11: 5 mg via INTRAVENOUS
  Administered 2020-04-11: 10 mg via INTRAVENOUS
  Administered 2020-04-11 (×4): 5 mg via INTRAVENOUS

## 2020-04-11 MED ORDER — CEFAZOLIN SODIUM-DEXTROSE 2-4 GM/100ML-% IV SOLN
2.0000 g | INTRAVENOUS | Status: AC
  Administered 2020-04-11: 2 g via INTRAVENOUS

## 2020-04-11 MED ORDER — SODIUM CHLORIDE 0.9 % IV SOLN: INTRAVENOUS | Status: DC

## 2020-04-11 MED ORDER — VANCOMYCIN HCL 500 MG IV SOLR
INTRAVENOUS | Status: DC | PRN
  Administered 2020-04-11: 500 mg via TOPICAL

## 2020-04-11 SURGICAL SUPPLY — 88 items
APL PRP STRL LF DISP 70% ISPRP (MISCELLANEOUS) ×1
BANDAGE ESMARK 6X9 LF (GAUZE/BANDAGES/DRESSINGS) IMPLANT
BIT DRILL CANN 2.4 (BIT) ×2
BIT DRILL CANN 2.4MM (BIT) ×1
BIT DRILL CANN MAX VPC 2.4 (BIT) IMPLANT
BIT DRILL PRFL CANNULTD 3.4MM (BIT) IMPLANT
BLADE AVERAGE 25MMX9MM (BLADE) ×1
BLADE AVERAGE 25X9 (BLADE) ×1 IMPLANT
BLADE LONG MED 25X9 (BLADE) ×2 IMPLANT
BLADE LONG MED 25X9MM (BLADE) ×1
BLADE MICRO SAGITTAL (BLADE) ×3 IMPLANT
BLADE OSC/SAG .038X5.5 CUT EDG (BLADE) IMPLANT
BLADE SURG 15 STRL LF DISP TIS (BLADE) ×3 IMPLANT
BLADE SURG 15 STRL SS (BLADE) ×9
BNDG CMPR 9X4 STRL LF SNTH (GAUZE/BANDAGES/DRESSINGS)
BNDG CMPR 9X6 STRL LF SNTH (GAUZE/BANDAGES/DRESSINGS)
BNDG COHESIVE 4X5 TAN STRL (GAUZE/BANDAGES/DRESSINGS) ×3 IMPLANT
BNDG COHESIVE 6X5 TAN STRL LF (GAUZE/BANDAGES/DRESSINGS) IMPLANT
BNDG CONFORM 3 STRL LF (GAUZE/BANDAGES/DRESSINGS) ×3 IMPLANT
BNDG ESMARK 4X9 LF (GAUZE/BANDAGES/DRESSINGS) IMPLANT
BNDG ESMARK 6X9 LF (GAUZE/BANDAGES/DRESSINGS)
CHLORAPREP W/TINT 26 (MISCELLANEOUS) ×3 IMPLANT
COVER BACK TABLE 60X90IN (DRAPES) ×3 IMPLANT
COVER WAND RF STERILE (DRAPES) IMPLANT
CUFF TOURN SGL QUICK 24 (TOURNIQUET CUFF)
CUFF TOURN SGL QUICK 34 (TOURNIQUET CUFF) ×3
CUFF TRNQT CYL 24X4X16.5-23 (TOURNIQUET CUFF) IMPLANT
CUFF TRNQT CYL 34X4.125X (TOURNIQUET CUFF) IMPLANT
DRAPE EXTREMITY T 121X128X90 (DISPOSABLE) ×3 IMPLANT
DRAPE OEC MINIVIEW 54X84 (DRAPES) ×3 IMPLANT
DRAPE U-SHAPE 47X51 STRL (DRAPES) ×3 IMPLANT
DRILL PROFILE CANNULATED 3.4MM (BIT) ×3
DRSG MEPITEL 4X7.2 (GAUZE/BANDAGES/DRESSINGS) ×3 IMPLANT
DRSG PAD ABDOMINAL 8X10 ST (GAUZE/BANDAGES/DRESSINGS) ×3 IMPLANT
ELECT REM PT RETURN 9FT ADLT (ELECTROSURGICAL) ×3
ELECTRODE REM PT RTRN 9FT ADLT (ELECTROSURGICAL) ×1 IMPLANT
GAUZE SPONGE 4X4 12PLY STRL (GAUZE/BANDAGES/DRESSINGS) ×3 IMPLANT
GLOVE BIO SURGEON STRL SZ8 (GLOVE) ×3 IMPLANT
GLOVE BIOGEL PI IND STRL 7.0 (GLOVE) IMPLANT
GLOVE BIOGEL PI IND STRL 8 (GLOVE) ×2 IMPLANT
GLOVE BIOGEL PI INDICATOR 7.0 (GLOVE) ×4
GLOVE BIOGEL PI INDICATOR 8 (GLOVE) ×4
GLOVE ECLIPSE 7.0 STRL STRAW (GLOVE) ×4 IMPLANT
GLOVE ECLIPSE 8.0 STRL XLNG CF (GLOVE) ×3 IMPLANT
GOWN STRL REUS W/ TWL LRG LVL3 (GOWN DISPOSABLE) ×1 IMPLANT
GOWN STRL REUS W/ TWL XL LVL3 (GOWN DISPOSABLE) ×2 IMPLANT
GOWN STRL REUS W/TWL LRG LVL3 (GOWN DISPOSABLE) ×3
GOWN STRL REUS W/TWL XL LVL3 (GOWN DISPOSABLE) ×6
K-WIRE .054X4 (WIRE) ×2 IMPLANT
K-WIRE COCR 1.1X105 (WIRE) ×3
KWIRE COCR 1.1X105 (WIRE) IMPLANT
NDL HYPO 25X1 1.5 SAFETY (NEEDLE) IMPLANT
NEEDLE HYPO 22GX1.5 SAFETY (NEEDLE) IMPLANT
NEEDLE HYPO 25X1 1.5 SAFETY (NEEDLE) IMPLANT
NS IRRIG 1000ML POUR BTL (IV SOLUTION) ×3 IMPLANT
PACK BASIN DAY SURGERY FS (CUSTOM PROCEDURE TRAY) ×3 IMPLANT
PAD CAST 4YDX4 CTTN HI CHSV (CAST SUPPLIES) ×1 IMPLANT
PADDING CAST COTTON 4X4 STRL (CAST SUPPLIES) ×3
PADDING CAST COTTON 6X4 STRL (CAST SUPPLIES) IMPLANT
PENCIL SMOKE EVACUATOR (MISCELLANEOUS) ×3 IMPLANT
SANITIZER HAND PURELL 535ML FO (MISCELLANEOUS) ×3 IMPLANT
SCREW CANN MAX VPC 3.4X20 (Screw) IMPLANT
SCREW HCS TWIST-OFF 2.0X12MM (Screw) ×4 IMPLANT
SCREW VCP 3.4X14 (Screw) ×2 IMPLANT
SCREW VCP 3.4X32MM (Screw) ×2 IMPLANT
SCREW VPC 3.4X16 (Screw) ×2 IMPLANT
SCREW VPS 3.4X20MM (Screw) ×3 IMPLANT
SHEET MEDIUM DRAPE 40X70 STRL (DRAPES) ×3 IMPLANT
SLEEVE SCD COMPRESS KNEE MED (MISCELLANEOUS) ×3 IMPLANT
SPLINT FAST PLASTER 5X30 (CAST SUPPLIES)
SPLINT PLASTER CAST FAST 5X30 (CAST SUPPLIES) IMPLANT
SPONGE LAP 18X18 RF (DISPOSABLE) ×3 IMPLANT
STOCKINETTE 6  STRL (DRAPES) ×3
STOCKINETTE 6 STRL (DRAPES) ×1 IMPLANT
SUCTION FRAZIER HANDLE 10FR (MISCELLANEOUS) ×3
SUCTION TUBE FRAZIER 10FR DISP (MISCELLANEOUS) ×1 IMPLANT
SUT ETHILON 3 0 PS 1 (SUTURE) ×5 IMPLANT
SUT MNCRL AB 3-0 PS2 18 (SUTURE) ×3 IMPLANT
SUT VIC AB 0 SH 27 (SUTURE) IMPLANT
SUT VIC AB 2-0 SH 27 (SUTURE) ×3
SUT VIC AB 2-0 SH 27XBRD (SUTURE) IMPLANT
SUT VICRYL 0 UR6 27IN ABS (SUTURE) IMPLANT
SYR BULB EAR ULCER 3OZ GRN STR (SYRINGE) ×3 IMPLANT
SYR CONTROL 10ML LL (SYRINGE) IMPLANT
TOWEL GREEN STERILE FF (TOWEL DISPOSABLE) ×6 IMPLANT
TUBE CONNECTING 20'X1/4 (TUBING) ×1
TUBE CONNECTING 20X1/4 (TUBING) ×2 IMPLANT
UNDERPAD 30X36 HEAVY ABSORB (UNDERPADS AND DIAPERS) ×3 IMPLANT

## 2020-04-11 NOTE — Anesthesia Procedure Notes (Signed)
Anesthesia Regional Block: Popliteal block   Pre-Anesthetic Checklist: ,, timeout performed, Correct Patient, Correct Site, Correct Laterality, Correct Procedure, Correct Position, site marked, Risks and benefits discussed,  Surgical consent,  Pre-op evaluation,  At surgeon's request and post-op pain management  Laterality: Right  Prep: chloraprep       Needles:  Injection technique: Single-shot  Needle Type: Stimiplex     Needle Length: 9cm  Needle Gauge: 21     Additional Needles:   Procedures:,,,, ultrasound used (permanent image in chart),,,,  Narrative:  Start time: 04/11/2020 6:56 AM End time: 04/11/2020 7:01 AM Injection made incrementally with aspirations every 5 mL.  Performed by: Personally  Anesthesiologist: Lynda Rainwater, MD

## 2020-04-11 NOTE — Discharge Instructions (Addendum)
[name], MD [facility]  Please read the following information regarding your care after surgery.  Medications  You only need a prescription for the narcotic pain medicine (ex. oxycodone, Percocet, Norco).  All of the other medicines listed below are available over the counter. X Aleve 2 pills twice a day for the first 3 days after surgery. X acetominophen (Tylenol) 650 mg every 4-6 hours as you need for minor to moderate pain X oxycodone as prescribed for severe pain  Narcotic pain medicine (ex. oxycodone, Percocet, Vicodin) will cause constipation.  To prevent this problem, take the following medicines while you are taking any pain medicine. X docusate sodium (Colace) 100 mg twice a day X senna (Senokot) 2 tablets twice a day    Weight Bearing X Bear weight only on your operated foot in the post-op shoe.   Cast / Splint / Dressing X Keep your splint, cast or dressing clean and dry.  Don't put anything (coat hanger, pencil, etc) down inside of it.  If it gets damp, use a hair dryer on the cool setting to dry it.  If it gets soaked, call the office to schedule an appointment for a cast change.    After your dressing, cast or splint is removed; you may shower, but do not soak or scrub the wound.  Allow the water to run over it, and then gently pat it dry.  Swelling It is normal for you to have swelling where you had surgery.  To reduce swelling and pain, keep your toes above your nose for at least 3 days after surgery.  It may be necessary to keep your foot or leg elevated for several weeks.  If it hurts, it should be elevated.  Follow Up Call my office at [phone] when you are discharged from the hospital or surgery center to schedule an appointment to be seen two weeks after surgery.  Call my office at [phone] if you develop a fever >101.5 F, nausea, vomiting, bleeding from the surgical site or severe pain.    Post Anesthesia Home Care Instructions  Activity: Get  plenty of rest for the remainder of the day. A responsible individual must stay with you for 24 hours following the procedure.  For the next 24 hours, DO NOT: -Drive a car -Paediatric nurse -Drink alcoholic beverages -Take any medication unless instructed by your physician -Make any legal decisions or sign important papers.  Meals: Start with liquid foods such as gelatin or soup. Progress to regular foods as tolerated. Avoid greasy, spicy, heavy foods. If nausea and/or vomiting occur, drink only clear liquids until the nausea and/or vomiting subsides. Call your physician if vomiting continues.  Special Instructions/Symptoms: Your throat may feel dry or sore from the anesthesia or the breathing tube placed in your throat during surgery. If this causes discomfort, gargle with warm salt water. The discomfort should disappear within 24 hours.   Regional Anesthesia Blocks  1. Numbness or the inability to move the "blocked" extremity may last from 3-48 hours after placement. The length of time depends on the medication injected and your individual response to the medication. If the numbness is not going away after 48 hours, call your surgeon.  2. The extremity that is blocked will need to be protected until the numbness is gone and the  Strength has returned. Because you cannot feel it, you will need to take extra care to avoid injury. Because it may be weak, you may have difficulty moving it or using it.  You may not know what position it is in without looking at it while the block is in effect.  3. For blocks in the legs and feet, returning to weight bearing and walking needs to be done carefully. You will need to wait until the numbness is entirely gone and the strength has returned. You should be able to move your leg and foot normally before you try and bear weight or walk. You will need someone to be with you when you first try to ensure you do not fall and possibly risk injury.  4. Bruising  and tenderness at the needle site are common side effects and will resolve in a few days.  5. Persistent numbness or new problems with movement should be communicated to the surgeon or the [facility] [phone] [place] [phone]).

## 2020-04-11 NOTE — Anesthesia Postprocedure Evaluation (Signed)
Anesthesia Post Note  Patient: [name]  Procedure(s) Performed: Right scarf and modified McBride bunionectomy, second and third metatarsal phalangeal weil osteotomy, third hammertoe correction with extensor tendon lengthening (Right Foot)     Patient location during evaluation: PACU Anesthesia Type: General Level of consciousness: awake and alert Pain management: pain level controlled Vital Signs Assessment: post-procedure vital signs reviewed and stable Respiratory status: spontaneous breathing, nonlabored ventilation and respiratory function stable Cardiovascular status: blood pressure returned to baseline and stable Postop Assessment: no apparent nausea or vomiting Anesthetic complications: no   No complications documented.  Last Vitals:  Vitals:   04/11/20 0930 04/11/20 0959  BP: (!) 103/55 124/75  Pulse: 60 (!) 57  Resp: 14 14  Temp:  36.4 C  SpO2: 100% 99%    Last Pain:  Vitals:   04/11/20 0959  TempSrc: Oral  PainSc: 0-No pain                 Lynda Rainwater

## 2020-04-11 NOTE — Transfer of Care (Signed)
Immediate Anesthesia Transfer of Care Note  Patient: [name]  Procedure(s) Performed: Right lapidus bunion correction, 2nd and 3rd metatarsal phalangeal weil osteotomy, possible hammertoe correction (Right )  Patient Location: PACU  Anesthesia Type:GA combined with regional for post-op pain  Level of Consciousness: drowsy  Airway & Oxygen Therapy: Patient Spontanous Breathing and Patient connected to face mask oxygen  Post-op Assessment: Report given to RN and Post -op Vital signs reviewed and stable  Post vital signs: Reviewed and stable  Last Vitals:  Vitals Value Taken Time  BP 113/57 04/11/20 0903  Temp    Pulse 64 04/11/20 0905  Resp 14 04/11/20 0905  SpO2 100 % 04/11/20 0905  Vitals shown include unvalidated device data.  Last Pain:  Vitals:   04/11/20 0630  TempSrc: Oral  PainSc: 8       Patients Stated Pain Goal: 2 (61/51/83 4373)  Complications: No complications documented.

## 2020-04-11 NOTE — Op Note (Signed)
04/11/2020  9:03 AM  PATIENT:  [name]  69 y.o. male  PRE-OPERATIVE DIAGNOSIS:   1.  Right foot bunion      2.  Right forefoot metatarsalgia      3.  Right 2nd and 3rd hammertoe deformities  POST-OPERATIVE DIAGNOSIS:  Same  Procedure(s): 1.  Right modified McBride bunionectomy 2.  Correction of right foot bunion deformity with double osteotomy (scarf and Akin) 3.  Right second and third metatarsal Weil osteotomies 4.  Right third hammertoe correction 5.  Right foot AP, oblique and lateral radiographs  SURGEON:  [name], MD  ASSISTANT: Mechele Claude, PA-C  ANESTHESIA:   General, regional  EBL:  minimal   TOURNIQUET: 68 minutes at 250 mmHg thigh  COMPLICATIONS:  None apparent  DISPOSITION:  Extubated, awake and stable to recovery.  INDICATION FOR PROCEDURE: The patient is a 69 year old male without significant past medical history.  He has a painful right foot bunion deformity as well as symptoms of metatarsalgia and second and third hammertoe deformities.  He has failed nonoperative treatment to date including activity modification, oral anti-inflammatories and shoewear modification.  He presents now for surgical treatment of his right painful forefoot deformities  The risks and benefits of the alternative treatment options have been discussed in detail.  The patient wishes to proceed with surgery and specifically understands risks of bleeding, infection, nerve damage, blood clots, need for additional surgery, amputation and death.  PROCEDURE IN DETAIL:  After pre operative consent was obtained, and the correct operative site was identified, the patient was brought to the operating room and placed supine on the OR table.  Anesthesia was administered.  Pre-operative antibiotics were administered.  A surgical timeout was taken.  The right lower extremity was prepped and draped in standard sterile fashion with a tourniquet around the thigh.  The extremity was elevated and the  tourniquet was inflated to 250 mmHg.  A longitudinal incision was made at the dorsum of the first webspace.  Dissection was carried down through the subcutaneous tissues.  The intermetatarsal ligament was divided under direct vision.  An arthrotomy was then made between the lateral sesamoid and the metatarsal head.  Small perforations were made in the lateral joint capsule.  The hallux could then be positioned in 20 degrees of varus passively.  Attention was turned to the medial forefoot where a longitudinal incision was made over the medial eminence.  Dissection was carried down through the subcutaneous tissues.  The medial joint capsule was incised and elevated plantarly and dorsally.  The hypertrophic medial eminence was resected in line with the first metatarsal shaft.  A scarf osteotomy was then made in the first metatarsal.  The head of the metatarsal was translated laterally correcting the hallux valgus and intermetatarsal angles.  The osteotomy was fixed with two 3.4 mm [employer]immer [employer] VPC screws.  Overhanging bone medially was trimmed with the oscillating saw.  Attention was then turned to the proximal phalanx.  The flexor and extensor tendons were protected.  A medial closing wedge osteotomy was made with the oscillating saw removing a small wedge of bone medially.  The osteotomy was closed and fixed with a [employer] 3.4 millimeter VPC screw.  Radiograph showed appropriate correction of the bunion deformity.  Attention was turned to the second webspace where a longitudinal incision was made.  Dissection was carried down through the subcutaneous tissues and carried to the second MTP joint.  The dorsal joint capsule was excised.  The extensor tendons  were protected.  A Weil osteotomy was made with the oscillating saw all removing a wedge of bone distally.  The head of the metatarsal was allowed to retract several millimeters proximally.  The osteotomy was fixed with a [employer]immer [employer] FRS screw.   Overhanging bone was trimmed with a rondure.  The same procedure was then performed for the third metatarsal shortening it several millimeters and fixing the osteotomy with an FRS screw.  Careful examination of the forefoot revealed that the second toe hammertoe deformity was flexible and corrected appropriately.  The third toe had a fixed contracture at the PIP joint.  The decision was made to proceed with hammertoe correction.  A transverse incision was made over the third toe PIP joint.  Dissection was carried down through the subcutaneous tissues and extensor mechanism.  The head of the proximal phalanx was resected followed by the base of the middle phalanx.  The joint was then fixed with a 3.4 mm VPC screw.  The extensor digitorum longus and brevis tendons were also lengthened.  Final AP, oblique and lateral radiographs showed appropriate position and length of all hardware and appropriate correction of the first, second and third ray deformities.  Hardware is appropriately positioned and of the appropriate lengths.  Wounds were irrigated.  Extensor tendons were repaired.  Vancomycin powder was sprinkled in the wounds.  The medial joint capsule was repaired with imbricating sutures of 2-0 Vicryl.  Subcutaneous tissues were closed with Monocryl and skin incisions were closed with nylon.  Sterile dressings were applied followed by a bunion wrap.  The tourniquet was released after application of the dressings.  The patient was awakened from anesthesia and transported to the recovery room in stable condition.   FOLLOW UP PLAN: Weightbearing as tolerated in a Darco shoe.  Follow-up in the office in 2 weeks for suture removal and conversion to a toe spacer.  Plan 6 weeks weightbearing immobilization in the Darco shoe.   RADIOGRAPHS: AP, oblique and lateral radiographs of the right foot are obtained intraoperatively.  These show interval correction of the first, second and third ray deformities.  Hardware  is appropriately positioned and of the appropriate lengths.   Mechele Claude PA-C was present and scrubbed for the duration of the operative case. His assistance was essential in positioning the patient, prepping and draping, gaining and maintaining exposure, performing the operation, closing and dressing the wounds and applying the splint.

## 2020-04-11 NOTE — Anesthesia Preprocedure Evaluation (Signed)
Anesthesia Evaluation  Patient identified by MRN, date of birth, ID band Patient awake    Reviewed: Allergy & Precautions, NPO status , Patient's Chart, lab work & pertinent test results, reviewed documented beta blocker date and time   History of Anesthesia Complications Negative for: history of anesthetic complications  Airway Mallampati: II  TM Distance: >3 FB Neck ROM: Full    Dental  (+) Missing, Dental Advisory Given, Chipped   Pulmonary neg pulmonary ROS,  08/12/2019 SARS coronavirus NEG   breath sounds clear to auscultation       Cardiovascular hypertension, Pt. on medications and Pt. on home beta blockers (-) angina Rhythm:Regular Rate:Normal     Neuro/Psych  Headaches,    GI/Hepatic Neg liver ROS, GERD  Medicated and Controlled,  Endo/Other  negative endocrine ROS  Renal/GU negative Renal ROS     Musculoskeletal   Abdominal (+) + obese,   Peds  Hematology negative hematology ROS (+)   Anesthesia Other Findings   Reproductive/Obstetrics                             Anesthesia Physical  Anesthesia Plan  ASA: II  Anesthesia Plan: General   Post-op Pain Management:  Regional for Post-op pain   Induction: Intravenous  PONV Risk Score and Plan: 2 and Ondansetron, Midazolam and Treatment may vary due to age or medical condition  Airway Management Planned: LMA  Additional Equipment:   Intra-op Plan:   Post-operative Plan: Extubation in OR  Informed Consent: I have reviewed the patients History and Physical, chart, labs and discussed the procedure including the risks, benefits and alternatives for the proposed anesthesia with the patient or authorized representative who has indicated his/her understanding and acceptance.     Dental advisory given  Plan Discussed with: CRNA and Surgeon  Anesthesia Plan Comments:         Anesthesia Quick Evaluation

## 2020-04-11 NOTE — Anesthesia Procedure Notes (Signed)
Procedure Name: LMA Insertion Date/Time: 04/11/2020 7:35 AM Performed by: Lavonia Dana, CRNA Pre-anesthesia Checklist: Patient identified, Emergency Drugs available, Suction available and Patient being monitored Patient Re-evaluated:Patient Re-evaluated prior to induction Oxygen Delivery Method: Circle system utilized Preoxygenation: Pre-oxygenation with 100% oxygen Induction Type: IV induction Ventilation: Mask ventilation without difficulty LMA: LMA inserted LMA Size: 5.0 Number of attempts: 1 Airway Equipment and Method: Bite block Placement Confirmation: positive ETCO2 Tube secured with: Tape Dental Injury: Teeth and Oropharynx as per pre-operative assessment

## 2020-04-11 NOTE — H&P (Signed)
[name] is an 69 y.o. male.   Chief Complaint: right foot pain HPI: The patient is a 69 year old male without significant past medical history.  He complains of chronic right forefoot pain.  He has a prominent bunion deformity as well as metatarsalgia and second and third hammertoes.  He has failed nonoperative treatment to date and presents today for surgical correction of these painful forefoot deformities.  Past Medical History:  Diagnosis Date  . Anemia    COUPLE OF YRS AGO- NO PROBLEMS SINCE  . Complication of anesthesia    REMEMBERS [place] [facility]  . GERD (gastroesophageal reflux disease)    RARE   . Hyperlipidemia   . Hypertension   . Migraine   . Pain    RIGHT SHOULDER - ROTATOR CUFF TEAR; denies further pain 08/23/2018.    Past Surgical History:  Procedure Laterality Date  . BACK SURGERY  2006   LOWER BACK FUSION - SCREWS AND RODS  . BACK SURGERY  06/2017  . CATARACT EXTRACTION W/PHACO Right 11/26/2015   Procedure: CATARACT EXTRACTION PHACO AND INTRAOCULAR LENS PLACEMENT (IOC);  Surgeon: [name], MD;  [facility]: [facility];  Service: Ophthalmology;  Laterality: Right;  CDE:6.97  . CATARACT EXTRACTION W/PHACO Left 12/10/2015   Procedure: CATARACT EXTRACTION PHACO AND INTRAOCULAR LENS PLACEMENT (IOC);  Surgeon: [name], MD;  Location: [facility];  Service: Ophthalmology;  Laterality: Left;  CDE:4.25  . CHOLECYSTECTOMY    . COLONOSCOPY WITH PROPOFOL N/A 12/02/2017   Procedure: COLONOSCOPY WITH PROPOFOL;  Surgeon: Daneil Dolin, MD;  [facility];  Service: Endoscopy;  Laterality: N/A;  8:30am  . ETHMOIDECTOMY Bilateral 08/15/2019   Procedure: ETHMOIDECTOMY;  Surgeon: [name], MD;  Location: [place];  Service: ENT;  Laterality: Bilateral;  . FINGERNAIL REMOVED RT INDEX FINGER - FOR CYST THAT WAS BEHIND THE NAIL    . FRONTAL SINUS EXPLORATION Bilateral 08/15/2019   Procedure: FRONTAL SINUS EXPLORATION;  Surgeon: [name], MD;   Location: [facility];  Service: ENT;  Laterality: Bilateral;  . left thumb surgery Left 06/2018   Dr. [name]  . LUMBAR SPINE SURGERY  08/11/2017   disc repair, Dr. [name]  . MAXILLARY ANTROSTOMY Bilateral 08/15/2019   Procedure: MAXILLARY ANTROSTOMY WITH TISSUE REMOVAL;  Surgeon: [name], MD;  Location: [place] [facility];  Service: ENT;  Laterality: Bilateral;  . SHOULDER OPEN ROTATOR CUFF REPAIR Right 09/01/2013   Procedure: RIGHT SHOULDER MINI OPEN ROTATOR CUFF REPAIR WITH SUBACROMIAL DECOMPRESSION;  Surgeon: [name], MD;  [facility];  Service: Orthopedics;  Laterality: Right;  . SINUS ENDO WITH FUSION Bilateral 08/15/2019   Procedure: SINUS ENDO WITH FUSION;  Surgeon: [name], MD;  Location: [facility];  Service: ENT;  Laterality: Bilateral;  . SPHENOIDECTOMY Bilateral 08/15/2019   Procedure: SPHENOIDECTOMY;  Surgeon: [name], MD;  Location: [facility];  Service: ENT;  Laterality: Bilateral;  . TONSILLECTOMY     SURGERY AS A CHILD  . TURBINATE REDUCTION Bilateral 08/15/2019   Procedure: TURBINATE RECESSION;  Surgeon: [name], MD;  Location: [facility];  Service: ENT;  Laterality: Bilateral;    Family History  Problem Relation Age of Onset  . Hypertension Mother   . Kidney failure Mother   . Diabetes Mother   . High blood pressure Father   . High blood pressure Sister   . Cancer Brother        brain tumor  .  High blood pressure Brother   . Other Other   . High blood pressure Brother   . Migraines Daughter   . Migraines Grandson   . Migraines Granddaughter   . Migraines Granddaughter   . Colon cancer Neg Hx    Social History:  reports that he has never smoked. He quit smokeless tobacco use about 4 years ago.  His smokeless tobacco use included snuff. He reports current alcohol use. He reports that he does not use drugs.  Allergies:  Allergies  Allergen Reactions  . Bee Venom Itching   . Codeine Itching    Medications Prior to Admission  Medication Sig Dispense Refill  . ALPRAZolam (XANAX) 0.5 MG tablet Take 0.5 mg by mouth at bedtime as needed for anxiety or sleep.    Marland Kitchen aspirin 81 MG chewable tablet Chew by mouth daily.    Marland Kitchen atorvastatin (LIPITOR) 20 MG tablet Take 20 mg by mouth daily with breakfast.     . docusate sodium (COLACE) 100 MG capsule Take 100 mg by mouth daily.    . Multiple Vitamins-Minerals (CENTRUM SILVER PO) Take by mouth.    . pantoprazole (PROTONIX) 40 MG tablet Take 40 mg by mouth every other day.     . valsartan (DIOVAN) 320 MG tablet Take 320 mg by mouth daily.      No results found for this or any previous visit (from the past 48 hour(s)). No results found.  Review of Systems no recent fever, chills, nausea, vomiting or changes in his appetite  Blood pressure 125/72, pulse 64, temperature 97.9 F (36.6 C), temperature source Oral, resp. rate (!) 9, height 5\' 9"  (1.753 m), weight 93.5 kg, SpO2 95 %. Physical Exam  Well-nourished well-developed man in no apparent distress.  Alert and oriented x4.  Normal mood and affect.  Gait is normal.  On standing exam he is a moderate bunion deformity of the right foot as well as second and third hammertoe deformities.  Skin is healthy and intact.  Pulses are palpable.  Normal sensibility to light touch dorsally and plantarly at the forefoot.   Assessment/Plan Right foot painful bunion deformity, metatarsalgia and second and third hammertoes. -To the operating room today for Lapidus bunion correction as well as second and third metatarsal Weil osteotomies and hammertoe corrections.  The risks and benefits of the alternative treatment options have been discussed in detail.  The patient wishes to proceed with surgery and specifically understands risks of bleeding, infection, nerve damage, blood clots, need for additional surgery, amputation and death.   [name], MD Apr 25, 2020, 7:26 AM

## 2020-04-11 NOTE — Progress Notes (Signed)
Assisted Dr. Sabra Heck with right, ultrasound guided, popliteal block. Side rails up, monitors on throughout procedure. See vital signs in flow sheet. Tolerated Procedure well.

## 2020-04-12 ENCOUNTER — Encounter (HOSPITAL_BASED_OUTPATIENT_CLINIC_OR_DEPARTMENT_OTHER): Payer: Self-pay | Admitting: Orthopedic Surgery

## 2020-04-16 DIAGNOSIS — J321 Chronic frontal sinusitis: Secondary | ICD-10-CM | POA: Diagnosis not present

## 2020-04-16 DIAGNOSIS — K648 Other hemorrhoids: Secondary | ICD-10-CM | POA: Diagnosis not present

## 2020-04-16 DIAGNOSIS — E782 Mixed hyperlipidemia: Secondary | ICD-10-CM | POA: Diagnosis not present

## 2020-04-16 DIAGNOSIS — K219 Gastro-esophageal reflux disease without esophagitis: Secondary | ICD-10-CM | POA: Diagnosis not present

## 2020-04-16 DIAGNOSIS — R972 Elevated prostate specific antigen [PSA]: Secondary | ICD-10-CM | POA: Diagnosis not present

## 2020-04-16 DIAGNOSIS — G4700 Insomnia, unspecified: Secondary | ICD-10-CM | POA: Diagnosis not present

## 2020-04-16 DIAGNOSIS — I10 Essential (primary) hypertension: Secondary | ICD-10-CM | POA: Diagnosis not present

## 2020-04-16 DIAGNOSIS — Z0001 Encounter for general adult medical examination with abnormal findings: Secondary | ICD-10-CM | POA: Diagnosis not present

## 2020-04-16 DIAGNOSIS — M65842 Other synovitis and tenosynovitis, left hand: Secondary | ICD-10-CM | POA: Diagnosis not present

## 2020-04-16 DIAGNOSIS — M545 Low back pain: Secondary | ICD-10-CM | POA: Diagnosis not present

## 2020-04-16 DIAGNOSIS — M65841 Other synovitis and tenosynovitis, right hand: Secondary | ICD-10-CM | POA: Diagnosis not present

## 2020-04-16 DIAGNOSIS — R7301 Impaired fasting glucose: Secondary | ICD-10-CM | POA: Diagnosis not present

## 2020-04-18 ENCOUNTER — Telehealth: Payer: Self-pay | Admitting: *Deleted

## 2020-04-18 NOTE — Telephone Encounter (Signed)
Left message for patient to return call to [facility], [place] at [phone]. Patient will need to be informed of potential exposure to COVID-19 during surgical procedure at [facility] and offered testing. If patient returns call please transfer to [facility].

## 2020-04-18 NOTE — Telephone Encounter (Signed)
Patient returned call. Patient informed of potential exposure to employee that tested positive. Patient verbalized understanding. Patient states he is not having any symptoms and declines testing at this time. Patient advised to return call to [phone] if symptoms develop or if he decides he wants to be tested. Understanding verbalized.

## 2020-04-29 DIAGNOSIS — L905 Scar conditions and fibrosis of skin: Secondary | ICD-10-CM | POA: Diagnosis not present

## 2020-04-29 DIAGNOSIS — L570 Actinic keratosis: Secondary | ICD-10-CM | POA: Diagnosis not present

## 2020-04-29 DIAGNOSIS — D485 Neoplasm of uncertain behavior of skin: Secondary | ICD-10-CM | POA: Diagnosis not present

## 2020-04-29 DIAGNOSIS — Z1283 Encounter for screening for malignant neoplasm of skin: Secondary | ICD-10-CM | POA: Diagnosis not present

## 2020-04-29 DIAGNOSIS — X32XXXA Exposure to sunlight, initial encounter: Secondary | ICD-10-CM | POA: Diagnosis not present

## 2020-04-29 DIAGNOSIS — D225 Melanocytic nevi of trunk: Secondary | ICD-10-CM | POA: Diagnosis not present

## 2020-05-02 ENCOUNTER — Encounter (HOSPITAL_COMMUNITY): Payer: Self-pay

## 2020-05-02 NOTE — Therapy (Signed)
[place], [place], [address] Phone: [phone]   Fax:  [phone]  Patient Details  Name: [name] MRN: [id number] Date of Birth: December 09, 1950 Referring Provider:  No ref. provider found  Encounter Date: 05/02/2020  OCCUPATIONAL THERAPY DISCHARGE SUMMARY  Visits from Start of Care: 2  Current functional level related to goals / functional outcomes: Patient discharged from OT services as he failed to return to clinic after second treatment session. Did voice interest in continuing with therapy although never scheduled to return.       OT LONG TERM GOAL #1   Title  Patient will return to highest level of independence while using his right  hand as dominant with all daily tasks.    Time  8    Period  Weeks    Status  On-going        OT LONG TERM GOAL #2   Title  Patient will increase fine motor coordination by completing the 9 hole peg test in 25 seconds or less with his right in order to return to completing simple household tasks.     Time  8    Period  Weeks    Status  On-going        OT LONG TERM GOAL #3   Title  Patient will increase right hand grip strength by 15# and pinch strength by 10# in order to hold tools and items of weight without difficulty.     Time  8    Period  Weeks    Status  On-going        OT LONG TERM GOAL #4   Title  Patient will increase right wrist strength to 4+/5 in order to utilize his right hand to push up from a chair and hold items of 5# or greater.     Time  8    Period  Weeks    Status  On-going        OT LONG TERM GOAL #5   Title  Patient will decrease fascial restrictions to trace amount in his right hand in order to increase the functional mobility needed to complete daily tasks.     Time  8    Period  Weeks    Status  On-going        OT LONG TERM GOAL #6   Title  Patient will decrease pain to 3/10 or less in his right hand when  completing functional activities.     Time  8    Period  Weeks    Status  On-going       Remaining deficits: All deficits remain   Education / Equipment: HEP for hand and wrist  Plan: Patient agrees to discharge.  Patient goals were not met. Patient is being discharged due to not returning since the last visit.  ?????         Ailene Ravel, OTR/L,CBIS  [phone]  05/02/2020, 1:36 PM  [facility] [address] [place], [place], [address] Phone: [phone]   Fax:  [phone]

## 2020-05-16 DIAGNOSIS — G4700 Insomnia, unspecified: Secondary | ICD-10-CM | POA: Diagnosis not present

## 2020-05-16 DIAGNOSIS — K219 Gastro-esophageal reflux disease without esophagitis: Secondary | ICD-10-CM | POA: Diagnosis not present

## 2020-05-16 DIAGNOSIS — M79671 Pain in right foot: Secondary | ICD-10-CM | POA: Diagnosis not present

## 2020-05-16 DIAGNOSIS — M65842 Other synovitis and tenosynovitis, left hand: Secondary | ICD-10-CM | POA: Diagnosis not present

## 2020-05-16 DIAGNOSIS — J321 Chronic frontal sinusitis: Secondary | ICD-10-CM | POA: Diagnosis not present

## 2020-05-16 DIAGNOSIS — M65841 Other synovitis and tenosynovitis, right hand: Secondary | ICD-10-CM | POA: Diagnosis not present

## 2020-05-16 DIAGNOSIS — I10 Essential (primary) hypertension: Secondary | ICD-10-CM | POA: Diagnosis not present

## 2020-05-16 DIAGNOSIS — M21611 Bunion of right foot: Secondary | ICD-10-CM | POA: Diagnosis not present

## 2020-05-16 DIAGNOSIS — R972 Elevated prostate specific antigen [PSA]: Secondary | ICD-10-CM | POA: Diagnosis not present

## 2020-05-16 DIAGNOSIS — M545 Low back pain: Secondary | ICD-10-CM | POA: Diagnosis not present

## 2020-05-16 DIAGNOSIS — Z0001 Encounter for general adult medical examination with abnormal findings: Secondary | ICD-10-CM | POA: Diagnosis not present

## 2020-05-16 DIAGNOSIS — Z4889 Encounter for other specified surgical aftercare: Secondary | ICD-10-CM | POA: Diagnosis not present

## 2020-05-16 DIAGNOSIS — K648 Other hemorrhoids: Secondary | ICD-10-CM | POA: Diagnosis not present

## 2020-05-16 DIAGNOSIS — R7301 Impaired fasting glucose: Secondary | ICD-10-CM | POA: Diagnosis not present

## 2020-05-16 DIAGNOSIS — M7741 Metatarsalgia, right foot: Secondary | ICD-10-CM | POA: Diagnosis not present

## 2020-05-16 DIAGNOSIS — E782 Mixed hyperlipidemia: Secondary | ICD-10-CM | POA: Diagnosis not present

## 2020-05-28 DIAGNOSIS — M79644 Pain in right finger(s): Secondary | ICD-10-CM | POA: Diagnosis not present

## 2020-06-14 DIAGNOSIS — Z4789 Encounter for other orthopedic aftercare: Secondary | ICD-10-CM | POA: Diagnosis not present

## 2020-06-14 DIAGNOSIS — M79671 Pain in right foot: Secondary | ICD-10-CM | POA: Diagnosis not present

## 2020-06-15 DIAGNOSIS — E782 Mixed hyperlipidemia: Secondary | ICD-10-CM | POA: Diagnosis not present

## 2020-06-15 DIAGNOSIS — Z0001 Encounter for general adult medical examination with abnormal findings: Secondary | ICD-10-CM | POA: Diagnosis not present

## 2020-06-15 DIAGNOSIS — R972 Elevated prostate specific antigen [PSA]: Secondary | ICD-10-CM | POA: Diagnosis not present

## 2020-06-15 DIAGNOSIS — K648 Other hemorrhoids: Secondary | ICD-10-CM | POA: Diagnosis not present

## 2020-06-15 DIAGNOSIS — G4700 Insomnia, unspecified: Secondary | ICD-10-CM | POA: Diagnosis not present

## 2020-06-15 DIAGNOSIS — J321 Chronic frontal sinusitis: Secondary | ICD-10-CM | POA: Diagnosis not present

## 2020-06-15 DIAGNOSIS — M2041 Other hammer toe(s) (acquired), right foot: Secondary | ICD-10-CM | POA: Diagnosis not present

## 2020-06-15 DIAGNOSIS — R7301 Impaired fasting glucose: Secondary | ICD-10-CM | POA: Diagnosis not present

## 2020-06-15 DIAGNOSIS — M65841 Other synovitis and tenosynovitis, right hand: Secondary | ICD-10-CM | POA: Diagnosis not present

## 2020-06-15 DIAGNOSIS — I10 Essential (primary) hypertension: Secondary | ICD-10-CM | POA: Diagnosis not present

## 2020-06-15 DIAGNOSIS — K219 Gastro-esophageal reflux disease without esophagitis: Secondary | ICD-10-CM | POA: Diagnosis not present

## 2020-06-15 DIAGNOSIS — M65842 Other synovitis and tenosynovitis, left hand: Secondary | ICD-10-CM | POA: Diagnosis not present

## 2020-06-17 DIAGNOSIS — J310 Chronic rhinitis: Secondary | ICD-10-CM | POA: Diagnosis not present

## 2020-06-17 DIAGNOSIS — J324 Chronic pansinusitis: Secondary | ICD-10-CM | POA: Diagnosis not present

## 2020-06-26 ENCOUNTER — Encounter (HOSPITAL_COMMUNITY): Payer: Self-pay

## 2020-06-26 ENCOUNTER — Emergency Department (HOSPITAL_COMMUNITY)
Admission: EM | Admit: 2020-06-26 | Discharge: 2020-06-26 | Disposition: A | Discharge status: Home / Self Care | Payer: PPO | Attending: Emergency Medicine | Admitting: Emergency Medicine

## 2020-06-26 ENCOUNTER — Emergency Department (HOSPITAL_COMMUNITY): Payer: PPO

## 2020-06-26 ENCOUNTER — Other Ambulatory Visit: Payer: Self-pay

## 2020-06-26 DIAGNOSIS — S79911A Unspecified injury of right hip, initial encounter: Secondary | ICD-10-CM | POA: Diagnosis not present

## 2020-06-26 DIAGNOSIS — Y92410 Unspecified street and highway as the place of occurrence of the external cause: Secondary | ICD-10-CM | POA: Insufficient documentation

## 2020-06-26 DIAGNOSIS — Z23 Encounter for immunization: Secondary | ICD-10-CM | POA: Insufficient documentation

## 2020-06-26 DIAGNOSIS — Z79899 Other long term (current) drug therapy: Secondary | ICD-10-CM | POA: Diagnosis not present

## 2020-06-26 DIAGNOSIS — S81812A Laceration without foreign body, left lower leg, initial encounter: Secondary | ICD-10-CM | POA: Diagnosis not present

## 2020-06-26 DIAGNOSIS — S61210A Laceration without foreign body of right index finger without damage to nail, initial encounter: Secondary | ICD-10-CM | POA: Diagnosis not present

## 2020-06-26 DIAGNOSIS — M25572 Pain in left ankle and joints of left foot: Secondary | ICD-10-CM | POA: Insufficient documentation

## 2020-06-26 DIAGNOSIS — T148XXA Other injury of unspecified body region, initial encounter: Secondary | ICD-10-CM | POA: Diagnosis not present

## 2020-06-26 DIAGNOSIS — S81012A Laceration without foreign body, left knee, initial encounter: Secondary | ICD-10-CM | POA: Diagnosis not present

## 2020-06-26 DIAGNOSIS — M7989 Other specified soft tissue disorders: Secondary | ICD-10-CM | POA: Diagnosis not present

## 2020-06-26 DIAGNOSIS — I10 Essential (primary) hypertension: Secondary | ICD-10-CM | POA: Insufficient documentation

## 2020-06-26 DIAGNOSIS — Z7982 Long term (current) use of aspirin: Secondary | ICD-10-CM | POA: Insufficient documentation

## 2020-06-26 DIAGNOSIS — E782 Mixed hyperlipidemia: Secondary | ICD-10-CM | POA: Diagnosis not present

## 2020-06-26 DIAGNOSIS — R609 Edema, unspecified: Secondary | ICD-10-CM | POA: Diagnosis not present

## 2020-06-26 DIAGNOSIS — R52 Pain, unspecified: Secondary | ICD-10-CM | POA: Diagnosis not present

## 2020-06-26 DIAGNOSIS — K219 Gastro-esophageal reflux disease without esophagitis: Secondary | ICD-10-CM | POA: Diagnosis not present

## 2020-06-26 MED ORDER — TETANUS-DIPHTH-ACELL PERTUSSIS 5-2.5-18.5 LF-MCG/0.5 IM SUSY
0.5000 mL | PREFILLED_SYRINGE | Freq: Once | INTRAMUSCULAR | Status: AC
  Administered 2020-06-26: 0.5 mL via INTRAMUSCULAR
  Filled 2020-06-26: qty 0.5
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
## 2020-07-05 DIAGNOSIS — M79672 Pain in left foot: Secondary | ICD-10-CM | POA: Diagnosis not present

## 2020-07-17 DIAGNOSIS — M76822 Posterior tibial tendinitis, left leg: Secondary | ICD-10-CM | POA: Diagnosis not present

## 2020-07-17 DIAGNOSIS — M7741 Metatarsalgia, right foot: Secondary | ICD-10-CM | POA: Diagnosis not present

## 2020-07-25 DIAGNOSIS — M25641 Stiffness of right hand, not elsewhere classified: Secondary | ICD-10-CM | POA: Diagnosis not present

## 2020-07-25 DIAGNOSIS — M79644 Pain in right finger(s): Secondary | ICD-10-CM | POA: Diagnosis not present

## 2020-07-25 DIAGNOSIS — M20011 Mallet finger of right finger(s): Secondary | ICD-10-CM | POA: Diagnosis not present

## 2020-08-07 DIAGNOSIS — M65849 Other synovitis and tenosynovitis, unspecified hand: Secondary | ICD-10-CM | POA: Diagnosis not present

## 2020-08-07 DIAGNOSIS — J321 Chronic frontal sinusitis: Secondary | ICD-10-CM | POA: Diagnosis not present

## 2020-08-07 DIAGNOSIS — R972 Elevated prostate specific antigen [PSA]: Secondary | ICD-10-CM | POA: Diagnosis not present

## 2020-08-07 DIAGNOSIS — M722 Plantar fascial fibromatosis: Secondary | ICD-10-CM | POA: Diagnosis not present

## 2020-08-07 DIAGNOSIS — Z23 Encounter for immunization: Secondary | ICD-10-CM | POA: Diagnosis not present

## 2020-08-07 DIAGNOSIS — R252 Cramp and spasm: Secondary | ICD-10-CM | POA: Diagnosis not present

## 2020-08-07 DIAGNOSIS — G4489 Other headache syndrome: Secondary | ICD-10-CM | POA: Diagnosis not present

## 2020-08-07 DIAGNOSIS — Z712 Person consulting for explanation of examination or test findings: Secondary | ICD-10-CM | POA: Diagnosis not present

## 2020-08-07 DIAGNOSIS — I10 Essential (primary) hypertension: Secondary | ICD-10-CM | POA: Diagnosis not present

## 2020-08-07 DIAGNOSIS — Z0000 Encounter for general adult medical examination without abnormal findings: Secondary | ICD-10-CM | POA: Diagnosis not present

## 2020-08-07 DIAGNOSIS — G4700 Insomnia, unspecified: Secondary | ICD-10-CM | POA: Diagnosis not present

## 2020-08-07 DIAGNOSIS — E782 Mixed hyperlipidemia: Secondary | ICD-10-CM | POA: Diagnosis not present

## 2020-08-08 DIAGNOSIS — M79644 Pain in right finger(s): Secondary | ICD-10-CM | POA: Diagnosis not present

## 2020-08-08 DIAGNOSIS — M20011 Mallet finger of right finger(s): Secondary | ICD-10-CM | POA: Diagnosis not present

## 2020-08-13 DIAGNOSIS — I10 Essential (primary) hypertension: Secondary | ICD-10-CM | POA: Diagnosis not present

## 2020-08-13 DIAGNOSIS — G4700 Insomnia, unspecified: Secondary | ICD-10-CM | POA: Diagnosis not present

## 2020-08-13 DIAGNOSIS — E782 Mixed hyperlipidemia: Secondary | ICD-10-CM | POA: Diagnosis not present

## 2020-08-13 DIAGNOSIS — M2041 Other hammer toe(s) (acquired), right foot: Secondary | ICD-10-CM | POA: Diagnosis not present

## 2020-08-13 DIAGNOSIS — M65842 Other synovitis and tenosynovitis, left hand: Secondary | ICD-10-CM | POA: Diagnosis not present

## 2020-08-13 DIAGNOSIS — R972 Elevated prostate specific antigen [PSA]: Secondary | ICD-10-CM | POA: Diagnosis not present

## 2020-08-13 DIAGNOSIS — K219 Gastro-esophageal reflux disease without esophagitis: Secondary | ICD-10-CM | POA: Diagnosis not present

## 2020-08-13 DIAGNOSIS — M65841 Other synovitis and tenosynovitis, right hand: Secondary | ICD-10-CM | POA: Diagnosis not present

## 2020-08-13 DIAGNOSIS — K648 Other hemorrhoids: Secondary | ICD-10-CM | POA: Diagnosis not present

## 2020-08-13 DIAGNOSIS — J321 Chronic frontal sinusitis: Secondary | ICD-10-CM | POA: Diagnosis not present

## 2020-08-13 DIAGNOSIS — R7301 Impaired fasting glucose: Secondary | ICD-10-CM | POA: Diagnosis not present

## 2020-08-13 DIAGNOSIS — M2042 Other hammer toe(s) (acquired), left foot: Secondary | ICD-10-CM | POA: Diagnosis not present

## 2020-08-16 DIAGNOSIS — J321 Chronic frontal sinusitis: Secondary | ICD-10-CM | POA: Diagnosis not present

## 2020-08-16 DIAGNOSIS — R972 Elevated prostate specific antigen [PSA]: Secondary | ICD-10-CM | POA: Diagnosis not present

## 2020-08-16 DIAGNOSIS — M65842 Other synovitis and tenosynovitis, left hand: Secondary | ICD-10-CM | POA: Diagnosis not present

## 2020-08-16 DIAGNOSIS — M65841 Other synovitis and tenosynovitis, right hand: Secondary | ICD-10-CM | POA: Diagnosis not present

## 2020-08-16 DIAGNOSIS — M2041 Other hammer toe(s) (acquired), right foot: Secondary | ICD-10-CM | POA: Diagnosis not present

## 2020-08-16 DIAGNOSIS — R7301 Impaired fasting glucose: Secondary | ICD-10-CM | POA: Diagnosis not present

## 2020-08-16 DIAGNOSIS — E782 Mixed hyperlipidemia: Secondary | ICD-10-CM | POA: Diagnosis not present

## 2020-08-16 DIAGNOSIS — K648 Other hemorrhoids: Secondary | ICD-10-CM | POA: Diagnosis not present

## 2020-08-16 DIAGNOSIS — M2042 Other hammer toe(s) (acquired), left foot: Secondary | ICD-10-CM | POA: Diagnosis not present

## 2020-08-16 DIAGNOSIS — I10 Essential (primary) hypertension: Secondary | ICD-10-CM | POA: Diagnosis not present

## 2020-08-16 DIAGNOSIS — K219 Gastro-esophageal reflux disease without esophagitis: Secondary | ICD-10-CM | POA: Diagnosis not present

## 2020-08-16 DIAGNOSIS — G4700 Insomnia, unspecified: Secondary | ICD-10-CM | POA: Diagnosis not present

## 2020-08-19 DIAGNOSIS — M7741 Metatarsalgia, right foot: Secondary | ICD-10-CM | POA: Diagnosis not present

## 2020-08-19 DIAGNOSIS — T8484XA Pain due to internal orthopedic prosthetic devices, implants and grafts, initial encounter: Secondary | ICD-10-CM | POA: Diagnosis not present

## 2020-08-20 ENCOUNTER — Other Ambulatory Visit (HOSPITAL_COMMUNITY): Payer: Self-pay | Admitting: Orthopedic Surgery

## 2020-08-26 DIAGNOSIS — D2261 Melanocytic nevi of right upper limb, including shoulder: Secondary | ICD-10-CM | POA: Diagnosis not present

## 2020-08-26 DIAGNOSIS — D225 Melanocytic nevi of trunk: Secondary | ICD-10-CM | POA: Diagnosis not present

## 2020-08-26 DIAGNOSIS — D485 Neoplasm of uncertain behavior of skin: Secondary | ICD-10-CM | POA: Diagnosis not present

## 2020-08-26 DIAGNOSIS — X32XXXD Exposure to sunlight, subsequent encounter: Secondary | ICD-10-CM | POA: Diagnosis not present

## 2020-08-26 DIAGNOSIS — L814 Other melanin hyperpigmentation: Secondary | ICD-10-CM | POA: Diagnosis not present

## 2020-08-26 DIAGNOSIS — Z1283 Encounter for screening for malignant neoplasm of skin: Secondary | ICD-10-CM | POA: Diagnosis not present

## 2020-08-26 DIAGNOSIS — L570 Actinic keratosis: Secondary | ICD-10-CM | POA: Diagnosis not present

## 2020-08-29 DIAGNOSIS — M79644 Pain in right finger(s): Secondary | ICD-10-CM | POA: Diagnosis not present

## 2020-08-29 DIAGNOSIS — M20011 Mallet finger of right finger(s): Secondary | ICD-10-CM | POA: Diagnosis not present

## 2020-08-29 DIAGNOSIS — M25641 Stiffness of right hand, not elsewhere classified: Secondary | ICD-10-CM | POA: Diagnosis not present

## 2020-09-14 DIAGNOSIS — R7301 Impaired fasting glucose: Secondary | ICD-10-CM | POA: Diagnosis not present

## 2020-09-14 DIAGNOSIS — M65841 Other synovitis and tenosynovitis, right hand: Secondary | ICD-10-CM | POA: Diagnosis not present

## 2020-09-14 DIAGNOSIS — I10 Essential (primary) hypertension: Secondary | ICD-10-CM | POA: Diagnosis not present

## 2020-09-14 DIAGNOSIS — M65842 Other synovitis and tenosynovitis, left hand: Secondary | ICD-10-CM | POA: Diagnosis not present

## 2020-09-14 DIAGNOSIS — M2042 Other hammer toe(s) (acquired), left foot: Secondary | ICD-10-CM | POA: Diagnosis not present

## 2020-09-14 DIAGNOSIS — K219 Gastro-esophageal reflux disease without esophagitis: Secondary | ICD-10-CM | POA: Diagnosis not present

## 2020-09-14 DIAGNOSIS — R972 Elevated prostate specific antigen [PSA]: Secondary | ICD-10-CM | POA: Diagnosis not present

## 2020-09-14 DIAGNOSIS — G4700 Insomnia, unspecified: Secondary | ICD-10-CM | POA: Diagnosis not present

## 2020-09-14 DIAGNOSIS — K648 Other hemorrhoids: Secondary | ICD-10-CM | POA: Diagnosis not present

## 2020-09-14 DIAGNOSIS — J321 Chronic frontal sinusitis: Secondary | ICD-10-CM | POA: Diagnosis not present

## 2020-09-14 DIAGNOSIS — E782 Mixed hyperlipidemia: Secondary | ICD-10-CM | POA: Diagnosis not present

## 2020-09-14 DIAGNOSIS — M2041 Other hammer toe(s) (acquired), right foot: Secondary | ICD-10-CM | POA: Diagnosis not present

## 2020-09-19 DIAGNOSIS — M20011 Mallet finger of right finger(s): Secondary | ICD-10-CM | POA: Diagnosis not present

## 2020-09-19 DIAGNOSIS — M79644 Pain in right finger(s): Secondary | ICD-10-CM | POA: Diagnosis not present

## 2020-10-05 DIAGNOSIS — G43001 Migraine without aura, not intractable, with status migrainosus: Secondary | ICD-10-CM | POA: Diagnosis not present

## 2020-10-05 DIAGNOSIS — M25561 Pain in right knee: Secondary | ICD-10-CM | POA: Diagnosis not present

## 2020-10-08 DIAGNOSIS — M20011 Mallet finger of right finger(s): Secondary | ICD-10-CM | POA: Diagnosis not present

## 2020-10-08 DIAGNOSIS — M79644 Pain in right finger(s): Secondary | ICD-10-CM | POA: Diagnosis not present

## 2020-10-10 ENCOUNTER — Ambulatory Visit (HOSPITAL_BASED_OUTPATIENT_CLINIC_OR_DEPARTMENT_OTHER): Admit: 2020-10-10 | Payer: PPO | Admitting: Orthopedic Surgery

## 2020-10-10 ENCOUNTER — Encounter (HOSPITAL_BASED_OUTPATIENT_CLINIC_OR_DEPARTMENT_OTHER): Payer: Self-pay

## 2020-10-10 SURGERY — REMOVAL, HARDWARE
Anesthesia: Choice | Laterality: Right

## 2020-10-14 DIAGNOSIS — K219 Gastro-esophageal reflux disease without esophagitis: Secondary | ICD-10-CM | POA: Diagnosis not present

## 2020-10-14 DIAGNOSIS — R7301 Impaired fasting glucose: Secondary | ICD-10-CM | POA: Diagnosis not present

## 2020-10-14 DIAGNOSIS — I10 Essential (primary) hypertension: Secondary | ICD-10-CM | POA: Diagnosis not present

## 2020-10-14 DIAGNOSIS — J321 Chronic frontal sinusitis: Secondary | ICD-10-CM | POA: Diagnosis not present

## 2020-10-14 DIAGNOSIS — M65842 Other synovitis and tenosynovitis, left hand: Secondary | ICD-10-CM | POA: Diagnosis not present

## 2020-10-14 DIAGNOSIS — M2042 Other hammer toe(s) (acquired), left foot: Secondary | ICD-10-CM | POA: Diagnosis not present

## 2020-10-14 DIAGNOSIS — E782 Mixed hyperlipidemia: Secondary | ICD-10-CM | POA: Diagnosis not present

## 2020-10-14 DIAGNOSIS — M2041 Other hammer toe(s) (acquired), right foot: Secondary | ICD-10-CM | POA: Diagnosis not present

## 2020-10-14 DIAGNOSIS — R972 Elevated prostate specific antigen [PSA]: Secondary | ICD-10-CM | POA: Diagnosis not present

## 2020-10-14 DIAGNOSIS — G4700 Insomnia, unspecified: Secondary | ICD-10-CM | POA: Diagnosis not present

## 2020-10-14 DIAGNOSIS — K648 Other hemorrhoids: Secondary | ICD-10-CM | POA: Diagnosis not present

## 2020-10-14 DIAGNOSIS — M65841 Other synovitis and tenosynovitis, right hand: Secondary | ICD-10-CM | POA: Diagnosis not present

## 2020-10-15 DIAGNOSIS — J0100 Acute maxillary sinusitis, unspecified: Secondary | ICD-10-CM | POA: Diagnosis not present

## 2020-10-15 DIAGNOSIS — M25561 Pain in right knee: Secondary | ICD-10-CM | POA: Diagnosis not present

## 2020-10-24 ENCOUNTER — Ambulatory Visit (INDEPENDENT_AMBULATORY_CARE_PROVIDER_SITE_OTHER): Payer: PPO | Admitting: Orthopaedic Surgery

## 2020-10-24 ENCOUNTER — Ambulatory Visit: Payer: PPO

## 2020-10-24 ENCOUNTER — Encounter: Payer: Self-pay | Admitting: Orthopaedic Surgery

## 2020-10-24 ENCOUNTER — Other Ambulatory Visit: Payer: Self-pay

## 2020-10-24 VITALS — BP 154/92 | HR 74 | Ht 69.0 in | Wt 208.0 lb

## 2020-10-24 DIAGNOSIS — G8929 Other chronic pain: Secondary | ICD-10-CM | POA: Diagnosis not present

## 2020-10-24 DIAGNOSIS — M25561 Pain in right knee: Secondary | ICD-10-CM | POA: Diagnosis not present

## 2020-10-24 DIAGNOSIS — M79672 Pain in left foot: Secondary | ICD-10-CM

## 2020-10-24 DIAGNOSIS — M25572 Pain in left ankle and joints of left foot: Secondary | ICD-10-CM

## 2020-10-24 MED ORDER — NAPROXEN 500 MG PO TABS: 500.0000 mg | ORAL_TABLET | Freq: Two times a day (BID) | ORAL | 5 refills | Status: DC

## 2020-10-24 NOTE — Progress Notes (Signed)
Subjective:    Patient ID: [name], male    DOB: 1951-08-16, 70 y.o.   MRN: [id number]  HPI He was in an accident in November 2021 and hurt his left ankle and left leg.  He has had problems with the left ankle since then, the leg is better.  He has swelling laterally and sometimes dorsal foot.  He has no new trauma.  He has no redness.  He takes Tylenol and Advil alternatively.  He has a brace, he uses a boot.  But it still swells and is not getting any better.  He also has pain of the right knee and has a small cyst superior lateral patella area that began about six weeks ago.  He has had other cysts around the knee.  They come and go.  This one has been there longer than usual.  He saw Dr. Wende Neighbors about this and I have reviewed the notes.  He has no knee pain otherwise, no swelling, no giving way.  He has a mallet finger on the right index being treated by another physician.  He has a brace on it.  Review of Systems  Constitutional: Positive for activity change.  Musculoskeletal: Positive for arthralgias, gait problem and joint swelling.  Neurological: Positive for headaches.  All other systems reviewed and are negative.  For Review of Systems, all other systems reviewed and are negative.  The following is a summary of the past history medically, past history surgically, known current medicines, social history and family history.  This information is gathered electronically by the computer from prior information and documentation.  I review this each visit and have found including this information at this point in the chart is beneficial and informative.   Past Medical History:  Diagnosis Date  . Anemia    COUPLE OF YRS AGO- NO PROBLEMS SINCE  . Complication of anesthesia    REMEMBERS [facility]  . GERD (gastroesophageal reflux disease)    RARE   . Hyperlipidemia   . Hypertension   . Migraine   . Pain    RIGHT SHOULDER - ROTATOR CUFF TEAR; denies  further pain 08/23/2018.    Past Surgical History:  Procedure Laterality Date  . BACK SURGERY  2006   LOWER BACK FUSION - SCREWS AND RODS  . BACK SURGERY  06/2017  . BUNIONECTOMY WITH WEIL OSTEOTOMY Right 04/11/2020   Procedure: Right scarf and modified McBride bunionectomy, second and third metatarsal phalangeal weil osteotomy, third hammertoe correction with extensor tendon lengthening;  Surgeon: [name], MD;  Location: [place];  Service: Orthopedics;  Laterality: Right;  [facility], choice  . CATARACT EXTRACTION W/PHACO Right 11/26/2015   Procedure: CATARACT EXTRACTION PHACO AND INTRAOCULAR LENS PLACEMENT (IOC);  Surgeon: [name], MD;  [facility];  Service: Ophthalmology;  Laterality: Right;  CDE:6.97  . CATARACT EXTRACTION W/PHACO Left 12/10/2015   Procedure: CATARACT EXTRACTION PHACO AND INTRAOCULAR LENS PLACEMENT (IOC);  Surgeon: [name], MD;  [facility];  Service: Ophthalmology;  Laterality: Left;  CDE:4.25  . CHOLECYSTECTOMY    . COLONOSCOPY WITH PROPOFOL N/A 12/02/2017   Procedure: COLONOSCOPY WITH PROPOFOL;  Surgeon: Daneil Dolin, MD;  [facility];  Service: Endoscopy;  Laterality: N/A;  8:30am  . ETHMOIDECTOMY Bilateral 08/15/2019   Procedure: ETHMOIDECTOMY;  Surgeon: [name], MD;  Location: [place];  Service: ENT;  Laterality: Bilateral;  . FINGERNAIL REMOVED RT INDEX FINGER - FOR  CYST THAT WAS BEHIND THE NAIL    . FRONTAL SINUS EXPLORATION Bilateral 08/15/2019   Procedure: FRONTAL SINUS EXPLORATION;  Surgeon: [name], MD;  Location: [facility];  Service: ENT;  Laterality: Bilateral;  . left thumb surgery Left 06/2018   Dr. [name]  . LUMBAR SPINE SURGERY  08/11/2017   disc repair, Dr. [name]  . MAXILLARY ANTROSTOMY Bilateral 08/15/2019   Procedure: MAXILLARY ANTROSTOMY WITH TISSUE REMOVAL;  Surgeon: [name], MD;  Location: [place];  Service: ENT;  Laterality:  Bilateral;  . SHOULDER OPEN ROTATOR CUFF REPAIR Right 09/01/2013   Procedure: RIGHT SHOULDER MINI OPEN ROTATOR CUFF REPAIR WITH SUBACROMIAL DECOMPRESSION;  Surgeon: [name], MD;  [facility];  Service: Orthopedics;  Laterality: Right;  . SINUS ENDO WITH FUSION Bilateral 08/15/2019   Procedure: SINUS ENDO WITH FUSION;  Surgeon: [name], MD;  Location: [place];  Service: ENT;  Laterality: Bilateral;  . SPHENOIDECTOMY Bilateral 08/15/2019   Procedure: SPHENOIDECTOMY;  Surgeon: [name], MD;  Location: [place];  Service: ENT;  Laterality: Bilateral;  . TONSILLECTOMY     SURGERY AS A CHILD  . TURBINATE REDUCTION Bilateral 08/15/2019   Procedure: TURBINATE RECESSION;  Surgeon: [name], MD;  Location: [place];  Service: ENT;  Laterality: Bilateral;    Current Outpatient Medications on File Prior to Visit  Medication Sig Dispense Refill  . ALPRAZolam (XANAX) 0.5 MG tablet Take 0.5 mg by mouth at bedtime as needed for anxiety or sleep.    Marland Kitchen atorvastatin (LIPITOR) 20 MG tablet Take 20 mg by mouth daily with breakfast.     . Multiple Vitamins-Minerals (CENTRUM SILVER PO) Take by mouth.    . pantoprazole (PROTONIX) 40 MG tablet Take 40 mg by mouth every other day.     . senna (SENOKOT) 8.6 MG TABS tablet Take 2 tablets (17.2 mg total) by mouth 2 (two) times daily. 30 tablet 0  . valsartan (DIOVAN) 320 MG tablet Take 320 mg by mouth daily.     No current facility-administered medications on file prior to visit.    Social History   Socioeconomic History  . Marital status: Married    Spouse name: Not on file  . Number of children: 3  . Years of education: 42  . Highest education level: High school graduate  Occupational History  . Not on file  Tobacco Use  . Smoking status: Never Smoker  . Smokeless tobacco: Former Systems developer    Types: Snuff  Vaping Use  . Vaping Use: Never used  Substance and Sexual Activity  . Alcohol use: Yes     Comment: may have a beer or glass of whiskey occasionally  . Drug use: No  . Sexual activity: Yes    Birth control/protection: None  Other Topics Concern  . Not on file  Social History Narrative   Lives at home with his wife Jibri Schriefer   Right handed   Caffeine: 2 cups daily, coffee. Maybe 1 soda a day.   Social Determinants of Health   Financial Resource Strain: Not on file  Food Insecurity: Not on file  Transportation Needs: Not on file  Physical Activity: Not on file  Stress: Not on file  Social Connections: Not on file  Intimate Partner Violence: Not on file    Family History  Problem Relation Age of Onset  . Hypertension Mother   . Kidney failure Mother   . Diabetes Mother   .  High blood pressure Father   . High blood pressure Sister   . Cancer Brother        brain tumor  . High blood pressure Brother   . Other Other   . High blood pressure Brother   . Migraines Daughter   . Migraines Grandson   . Migraines Granddaughter   . Migraines Granddaughter   . Colon cancer Neg Hx     BP (!) 154/92   Pulse 74   Ht 5\' 9"  (1.753 m)   Wt 208 lb (94.3 kg)   BMI 30.72 kg/m   Body mass index is 30.72 kg/m.      Objective:   Physical Exam Vitals and nursing note reviewed. Exam conducted with a chaperone present.  Constitutional:      Appearance: He is well-developed.  HENT:     Head: Normocephalic and atraumatic.  Eyes:     Conjunctiva/sclera: Conjunctivae normal.     Pupils: Pupils are equal, round, and reactive to light.  Cardiovascular:     Rate and Rhythm: Normal rate and regular rhythm.  Pulmonary:     Effort: Pulmonary effort is normal.  Abdominal:     Palpations: Abdomen is soft.  Musculoskeletal:     Cervical back: Normal range of motion and neck supple.       Legs:       Feet:  Skin:    General: Skin is warm and dry.  Neurological:     Mental Status: He is alert and oriented to person, place, and time.     Cranial Nerves: No cranial  nerve deficit.     Motor: No abnormal muscle tone.     Coordination: Coordination normal.     Deep Tendon Reflexes: Reflexes are normal and symmetric. Reflexes normal.  Psychiatric:        Behavior: Behavior normal.        Thought Content: Thought content normal.        Judgment: Judgment normal.     X-rays were done of the left ankle and the right knee, reported separately.      Assessment & Plan:   Encounter Diagnoses  Name Primary?  . Chronic pain of right knee Yes  . Chronic foot pain, left   . Chronic pain of left ankle    The knee cyst may resolve on its own.  I will just watch it for now.  It is not big enough to try to aspirate.  The left ankle may need MRI.  He has been having problems almost four months.  I will begin Naprosyn 500 po bid pc and see how he does.  Return in two weeks.  Call if any problem.  Precautions discussed.   Electronically Signed Sanjuana Kava, MD 3/10/202212:12 PM

## 2020-10-29 DIAGNOSIS — M79644 Pain in right finger(s): Secondary | ICD-10-CM | POA: Diagnosis not present

## 2020-10-29 DIAGNOSIS — M20011 Mallet finger of right finger(s): Secondary | ICD-10-CM | POA: Diagnosis not present

## 2020-11-07 ENCOUNTER — Ambulatory Visit: Payer: PPO | Admitting: Orthopaedic Surgery

## 2020-11-07 ENCOUNTER — Encounter: Payer: Self-pay | Admitting: Orthopaedic Surgery

## 2020-11-07 ENCOUNTER — Other Ambulatory Visit: Payer: Self-pay

## 2020-11-07 VITALS — BP 173/106 | HR 57 | Ht 69.0 in | Wt 209.0 lb

## 2020-11-07 DIAGNOSIS — M25561 Pain in right knee: Secondary | ICD-10-CM | POA: Diagnosis not present

## 2020-11-07 DIAGNOSIS — G8929 Other chronic pain: Secondary | ICD-10-CM | POA: Diagnosis not present

## 2020-11-07 NOTE — Progress Notes (Signed)
Patient VO:HYWVPXT [name], male DOB:October 31, 1950, 70 y.o. GGY:[id number]  Chief Complaint  Patient presents with  . Knee Pain    Right knee, patient reports some better and feels like its some better, and feels like a knot is there,  . Ankle Pain    Left ankle., patient reports some better,    HPI  [name] is a 70 y.o. male who has continued right knee pain with a cyst off the superior lateral patella area.  It is not red.  His knee hurts.  It is only slightly better.    His left ankle is improved.  He still has some pain but is walking better.   Body mass index is 30.86 kg/m.  ROS  Review of Systems  Constitutional: Positive for activity change.  Musculoskeletal: Positive for arthralgias, gait problem and joint swelling.  Neurological: Positive for headaches.  All other systems reviewed and are negative.   All other systems reviewed and are negative.  The following is a summary of the past history medically, past history surgically, known current medicines, social history and family history.  This information is gathered electronically by the computer from prior information and documentation.  I review this each visit and have found including this information at this point in the chart is beneficial and informative.    Past Medical History:  Diagnosis Date  . Anemia    COUPLE OF YRS AGO- NO PROBLEMS SINCE  . Complication of anesthesia    REMEMBERS [facility]  . GERD (gastroesophageal reflux disease)    RARE   . Hyperlipidemia   . Hypertension   . Migraine   . Pain    RIGHT SHOULDER - ROTATOR CUFF TEAR; denies further pain 08/23/2018.    Past Surgical History:  Procedure Laterality Date  . BACK SURGERY  2006   LOWER BACK FUSION - SCREWS AND RODS  . BACK SURGERY  06/2017  . BUNIONECTOMY WITH WEIL OSTEOTOMY Right 04/11/2020   Procedure: Right scarf and modified McBride bunionectomy, second and third metatarsal phalangeal weil osteotomy,  third hammertoe correction with extensor tendon lengthening;  Surgeon: [name], MD;  Location: [facility];  Service: Orthopedics;  Laterality: Right;  [facility] mins [facility], choice  . CATARACT EXTRACTION W/PHACO Right 11/26/2015   Procedure: CATARACT EXTRACTION PHACO AND INTRAOCULAR LENS PLACEMENT (IOC);  Surgeon: [name], MD;  [facility];  Service: Ophthalmology;  Laterality: Right;  CDE:6.97  . CATARACT EXTRACTION W/PHACO Left 12/10/2015   Procedure: CATARACT EXTRACTION PHACO AND INTRAOCULAR LENS PLACEMENT (IOC);  Surgeon: [name], MD;  [facility];  Service: Ophthalmology;  Laterality: Left;  CDE:4.25  . CHOLECYSTECTOMY    . COLONOSCOPY WITH PROPOFOL N/A 12/02/2017   Procedure: COLONOSCOPY WITH PROPOFOL;  Surgeon: Daneil Dolin, MD;  [facility];  Service: Endoscopy;  Laterality: N/A;  8:30am  . ETHMOIDECTOMY Bilateral 08/15/2019   Procedure: ETHMOIDECTOMY;  Surgeon: [name], MD;  Location: [facility];  Service: ENT;  Laterality: Bilateral;  . FINGERNAIL REMOVED RT INDEX FINGER - FOR CYST THAT WAS BEHIND THE NAIL    . FRONTAL SINUS EXPLORATION Bilateral 08/15/2019   Procedure: FRONTAL SINUS EXPLORATION;  Surgeon: [name], MD;  Location: [facility] [facility];  Service: ENT;  Laterality: Bilateral;  . left thumb surgery Left 06/2018   Dr. [name]  . LUMBAR SPINE SURGERY  08/11/2017   disc repair, Dr. [name]  . MAXILLARY ANTROSTOMY Bilateral 08/15/2019   Procedure: MAXILLARY ANTROSTOMY  WITH TISSUE REMOVAL;  Surgeon: [name], MD;  Location: [facility];  Service: ENT;  Laterality: Bilateral;  . SHOULDER OPEN ROTATOR CUFF REPAIR Right 09/01/2013   Procedure: RIGHT SHOULDER MINI OPEN ROTATOR CUFF REPAIR WITH SUBACROMIAL DECOMPRESSION;  Surgeon: [name], MD;  [facility];  Service: Orthopedics;  Laterality: Right;  . SINUS ENDO WITH FUSION Bilateral 08/15/2019   Procedure: SINUS ENDO WITH FUSION;   Surgeon: [name], MD;  Location: [place] [facility];  Service: ENT;  Laterality: Bilateral;  . SPHENOIDECTOMY Bilateral 08/15/2019   Procedure: SPHENOIDECTOMY;  Surgeon: [name], MD;  Location: [facility];  Service: ENT;  Laterality: Bilateral;  . TONSILLECTOMY     SURGERY AS A CHILD  . TURBINATE REDUCTION Bilateral 08/15/2019   Procedure: TURBINATE RECESSION;  Surgeon: [name], MD;  Location: [facility];  Service: ENT;  Laterality: Bilateral;    Family History  Problem Relation Age of Onset  . Hypertension Mother   . Kidney failure Mother   . Diabetes Mother   . High blood pressure Father   . High blood pressure Sister   . Cancer Brother        brain tumor  . High blood pressure Brother   . Other Other   . High blood pressure Brother   . Migraines Daughter   . Migraines Grandson   . Migraines Granddaughter   . Migraines Granddaughter   . Colon cancer Neg Hx     Social History Social History   Tobacco Use  . Smoking status: Never Smoker  . Smokeless tobacco: Former Systems developer    Types: Snuff  Vaping Use  . Vaping Use: Never used  Substance Use Topics  . Alcohol use: Yes    Comment: may have a beer or glass of whiskey occasionally  . Drug use: No    Allergies  Allergen Reactions  . Bee Venom Itching  . Codeine Itching    Current Outpatient Medications  Medication Sig Dispense Refill  . ALPRAZolam (XANAX) 0.5 MG tablet Take 0.5 mg by mouth at bedtime as needed for anxiety or sleep.    Marland Kitchen atorvastatin (LIPITOR) 20 MG tablet Take 20 mg by mouth daily with breakfast.     . Multiple Vitamins-Minerals (CENTRUM SILVER PO) Take by mouth.    . naproxen (NAPROSYN) 500 MG tablet Take 1 tablet (500 mg total) by mouth 2 (two) times daily with a meal. 60 tablet 5  . pantoprazole (PROTONIX) 40 MG tablet Take 40 mg by mouth every other day.     . senna (SENOKOT) 8.6 MG TABS tablet Take 2 tablets (17.2 mg total) by mouth 2 (two) times daily. 30  tablet 0  . valsartan (DIOVAN) 320 MG tablet Take 320 mg by mouth daily.     No current facility-administered medications for this visit.     Physical Exam  Blood pressure (!) 173/106, pulse (!) 57, height 5\' 9"  (1.753 m), weight 209 lb (94.8 kg).  Constitutional: overall normal hygiene, normal nutrition, well developed, normal grooming, normal body habitus. Assistive device:none  Musculoskeletal: gait and station Limp right, muscle tone and strength are normal, no tremors or atrophy is present.  .  Neurological: coordination overall normal.  Deep tendon reflex/nerve stretch intact.  Sensation normal.  Cranial nerves II-XII intact.   Skin:   Normal overall no scars, lesions, ulcers or rashes. No psoriasis.  Psychiatric: Alert and oriented x 3.  Recent memory intact, remote memory unclear.  Normal mood and affect. Well groomed.  Good eye contact.  Cardiovascular: overall no swelling, no varicosities, no edema bilaterally, normal temperatures of the legs and arms, no clubbing, cyanosis and good capillary refill.  Lymphatic: palpation is normal.  Right knee has a small cyst off the superior lateral patella area that is very painful.  It is not red.  He has very slight effusion, ROM 0 to 110, limp right, NV intact.  The left ankle is slightly tender but has no swelling today and full ROM.  All other systems reviewed and are negative   The patient has been educated about the nature of the problem(s) and counseled on treatment options.  The patient appeared to understand what I have discussed and is in agreement with it.  Encounter Diagnosis  Name Primary?  . Chronic pain of right knee Yes    PLAN Call if any problems.  Precautions discussed.  Continue current medications.   Return to clinic 2 weeks   Get MRI of the right knee.  Electronically Signed Sanjuana Kava, MD 3/24/202210:07 AM

## 2020-11-13 DIAGNOSIS — R7301 Impaired fasting glucose: Secondary | ICD-10-CM | POA: Diagnosis not present

## 2020-11-13 DIAGNOSIS — M2041 Other hammer toe(s) (acquired), right foot: Secondary | ICD-10-CM | POA: Diagnosis not present

## 2020-11-13 DIAGNOSIS — K648 Other hemorrhoids: Secondary | ICD-10-CM | POA: Diagnosis not present

## 2020-11-13 DIAGNOSIS — J321 Chronic frontal sinusitis: Secondary | ICD-10-CM | POA: Diagnosis not present

## 2020-11-13 DIAGNOSIS — E782 Mixed hyperlipidemia: Secondary | ICD-10-CM | POA: Diagnosis not present

## 2020-11-13 DIAGNOSIS — G4700 Insomnia, unspecified: Secondary | ICD-10-CM | POA: Diagnosis not present

## 2020-11-13 DIAGNOSIS — M65842 Other synovitis and tenosynovitis, left hand: Secondary | ICD-10-CM | POA: Diagnosis not present

## 2020-11-13 DIAGNOSIS — M2042 Other hammer toe(s) (acquired), left foot: Secondary | ICD-10-CM | POA: Diagnosis not present

## 2020-11-13 DIAGNOSIS — R972 Elevated prostate specific antigen [PSA]: Secondary | ICD-10-CM | POA: Diagnosis not present

## 2020-11-13 DIAGNOSIS — K219 Gastro-esophageal reflux disease without esophagitis: Secondary | ICD-10-CM | POA: Diagnosis not present

## 2020-11-13 DIAGNOSIS — M65841 Other synovitis and tenosynovitis, right hand: Secondary | ICD-10-CM | POA: Diagnosis not present

## 2020-11-13 DIAGNOSIS — I10 Essential (primary) hypertension: Secondary | ICD-10-CM | POA: Diagnosis not present

## 2020-11-19 DIAGNOSIS — M19041 Primary osteoarthritis, right hand: Secondary | ICD-10-CM | POA: Diagnosis not present

## 2020-11-19 DIAGNOSIS — M20011 Mallet finger of right finger(s): Secondary | ICD-10-CM | POA: Diagnosis not present

## 2020-11-19 DIAGNOSIS — R2231 Localized swelling, mass and lump, right upper limb: Secondary | ICD-10-CM | POA: Diagnosis not present

## 2020-11-21 ENCOUNTER — Ambulatory Visit: Payer: PPO | Admitting: Orthopaedic Surgery

## 2020-11-26 ENCOUNTER — Ambulatory Visit (HOSPITAL_COMMUNITY)
Admission: RE | Admit: 2020-11-26 | Discharge: 2020-11-26 | Disposition: A | Discharge status: Home / Self Care | Payer: PPO | Source: Ambulatory Visit | Attending: Orthopaedic Surgery | Admitting: Orthopaedic Surgery

## 2020-11-26 ENCOUNTER — Other Ambulatory Visit: Payer: Self-pay

## 2020-11-26 DIAGNOSIS — M25561 Pain in right knee: Secondary | ICD-10-CM | POA: Diagnosis not present

## 2020-11-26 DIAGNOSIS — G8929 Other chronic pain: Secondary | ICD-10-CM | POA: Insufficient documentation

## 2020-11-28 ENCOUNTER — Encounter: Payer: Self-pay | Admitting: Orthopaedic Surgery

## 2020-11-28 ENCOUNTER — Ambulatory Visit: Payer: PPO | Admitting: Orthopaedic Surgery

## 2020-11-28 ENCOUNTER — Other Ambulatory Visit: Payer: Self-pay

## 2020-11-28 VITALS — BP 136/86 | HR 67 | Ht 66.0 in | Wt 210.1 lb

## 2020-11-28 DIAGNOSIS — M1711 Unilateral primary osteoarthritis, right knee: Secondary | ICD-10-CM | POA: Diagnosis not present

## 2020-11-28 DIAGNOSIS — M79672 Pain in left foot: Secondary | ICD-10-CM | POA: Diagnosis not present

## 2020-11-28 DIAGNOSIS — M23300 Other meniscus derangements, unspecified lateral meniscus, right knee: Secondary | ICD-10-CM | POA: Diagnosis not present

## 2020-11-28 DIAGNOSIS — M23303 Other meniscus derangements, unspecified medial meniscus, right knee: Secondary | ICD-10-CM | POA: Diagnosis not present

## 2020-11-28 DIAGNOSIS — G8929 Other chronic pain: Secondary | ICD-10-CM | POA: Diagnosis not present

## 2020-11-28 DIAGNOSIS — M25561 Pain in right knee: Secondary | ICD-10-CM

## 2020-11-28 NOTE — Progress Notes (Signed)
Patient [name], male DOB:06/24/1951, 70 y.o. DDU:[id number]  Chief Complaint  Patient presents with  . Knee Pain    R/ still sore and the knot is still there.    HPI  [name] is a 70 y.o. male who has right knee pain and left ankle pain.  He had MRI of the right knee showing: Other: Small volume prepatellar fluid suggesting a mild bursitis.  IMPRESSION: 1. Tricompartmental osteoarthritis, most pronounced within the lateral compartment. 2. Marked degeneration with complex tearing/maceration of the medial and lateral menisci. 3. Small knee joint effusion and small Baker's cyst. 4. Mild patellar tendinosis. 5. Mild prepatellar bursitis.  I have explained the findings to him.  He is better. He does not want any surgery.  He has pain over the left anterior talofibular ligament.  I have explained what may be going on.  He does not want a MRI.     Body mass index is 33.92 kg/m.  ROS  Review of Systems  Constitutional: Positive for activity change.  Musculoskeletal: Positive for arthralgias, gait problem and joint swelling.  Neurological: Positive for headaches.  All other systems reviewed and are negative.   All other systems reviewed and are negative.  The following is a summary of the past history medically, past history surgically, known current medicines, social history and family history.  This information is gathered electronically by the computer from prior information and documentation.  I review this each visit and have found including this information at this point in the chart is beneficial and informative.    Past Medical History:  Diagnosis Date  . Anemia    COUPLE OF YRS AGO- NO PROBLEMS SINCE  . Complication of anesthesia    REMEMBERS [facility]  . GERD (gastroesophageal reflux disease)    RARE   . Hyperlipidemia   . Hypertension   . Migraine   . Pain    RIGHT SHOULDER - ROTATOR CUFF TEAR; denies further pain  08/23/2018.    Past Surgical History:  Procedure Laterality Date  . BACK SURGERY  2006   LOWER BACK FUSION - SCREWS AND RODS  . BACK SURGERY  06/2017  . BUNIONECTOMY WITH WEIL OSTEOTOMY Right 04/11/2020   Procedure: Right scarf and modified McBride bunionectomy, second and third metatarsal phalangeal weil osteotomy, third hammertoe correction with extensor tendon lengthening;  Surgeon: [name], MD;  Location: [place];  Service: Orthopedics;  Laterality: Right;  [facility]  . CATARACT EXTRACTION W/PHACO Right 11/26/2015   Procedure: CATARACT EXTRACTION PHACO AND INTRAOCULAR LENS PLACEMENT (IOC);  Surgeon: [name], MD;  [facility];  Service: Ophthalmology;  Laterality: Right;  CDE:6.97  . CATARACT EXTRACTION W/PHACO Left 12/10/2015   Procedure: CATARACT EXTRACTION PHACO AND INTRAOCULAR LENS PLACEMENT (IOC);  Surgeon: [name], MD;  [facility];  Service: Ophthalmology;  Laterality: Left;  CDE:4.25  . CHOLECYSTECTOMY    . COLONOSCOPY WITH PROPOFOL N/A 12/02/2017   Procedure: COLONOSCOPY WITH PROPOFOL;  Surgeon: Daneil Dolin, MD;  [facility];  Service: Endoscopy;  Laterality: N/A;  8:30am  . ETHMOIDECTOMY Bilateral 08/15/2019   Procedure: ETHMOIDECTOMY;  Surgeon: [name], MD;  Location: [facility];  Service: ENT;  Laterality: Bilateral;  . FINGERNAIL REMOVED RT INDEX FINGER - FOR CYST THAT WAS BEHIND THE NAIL    . FRONTAL SINUS EXPLORATION Bilateral 08/15/2019   Procedure: FRONTAL SINUS EXPLORATION;  Surgeon: [name], MD;  Location: [place];  Service: ENT;  Laterality: Bilateral;  . left thumb surgery Left 06/2018   Dr. [name]  . LUMBAR SPINE SURGERY  08/11/2017   disc repair, Dr. [name]  . MAXILLARY ANTROSTOMY Bilateral 08/15/2019   Procedure: MAXILLARY ANTROSTOMY WITH TISSUE REMOVAL;  Surgeon: [name], MD;  Location: [facility];  Service: ENT;  Laterality: Bilateral;   . SHOULDER OPEN ROTATOR CUFF REPAIR Right 09/01/2013   Procedure: RIGHT SHOULDER MINI OPEN ROTATOR CUFF REPAIR WITH SUBACROMIAL DECOMPRESSION;  Surgeon: [name], MD;  [facility];  Service: Orthopedics;  Laterality: Right;  . SINUS ENDO WITH FUSION Bilateral 08/15/2019   Procedure: SINUS ENDO WITH FUSION;  Surgeon: [name], MD;  Location: [place];  Service: ENT;  Laterality: Bilateral;  . SPHENOIDECTOMY Bilateral 08/15/2019   Procedure: SPHENOIDECTOMY;  Surgeon: [name], MD;  Location: [facility];  Service: ENT;  Laterality: Bilateral;  . TONSILLECTOMY     SURGERY AS A CHILD  . TURBINATE REDUCTION Bilateral 08/15/2019   Procedure: TURBINATE RECESSION;  Surgeon: [name], MD;  Location: [place];  Service: ENT;  Laterality: Bilateral;    Family History  Problem Relation Age of Onset  . Hypertension Mother   . Kidney failure Mother   . Diabetes Mother   . High blood pressure Father   . High blood pressure Sister   . Cancer Brother        brain tumor  . High blood pressure Brother   . Other Other   . High blood pressure Brother   . Migraines Daughter   . Migraines Grandson   . Migraines Granddaughter   . Migraines Granddaughter   . Colon cancer Neg Hx     Social History Social History   Tobacco Use  . Smoking status: Never Smoker  . Smokeless tobacco: Former Systems developer    Types: Snuff  Vaping Use  . Vaping Use: Never used  Substance Use Topics  . Alcohol use: Yes    Comment: may have a beer or glass of whiskey occasionally  . Drug use: No    Allergies  Allergen Reactions  . Bee Venom Itching  . Codeine Itching    Current Outpatient Medications  Medication Sig Dispense Refill  . ALPRAZolam (XANAX) 0.5 MG tablet Take 0.5 mg by mouth at bedtime as needed for anxiety or sleep.    Marland Kitchen atorvastatin (LIPITOR) 20 MG tablet Take 20 mg by mouth daily with breakfast.     . Multiple Vitamins-Minerals (CENTRUM SILVER  PO) Take by mouth.    . naproxen (NAPROSYN) 500 MG tablet Take 1 tablet (500 mg total) by mouth 2 (two) times daily with a meal. 60 tablet 5  . pantoprazole (PROTONIX) 40 MG tablet Take 40 mg by mouth every other day.     . senna (SENOKOT) 8.6 MG TABS tablet Take 2 tablets (17.2 mg total) by mouth 2 (two) times daily. 30 tablet 0  . valsartan (DIOVAN) 320 MG tablet Take 320 mg by mouth daily.     No current facility-administered medications for this visit.     Physical Exam  Blood pressure 136/86, pulse 67, height 5\' 6"  (1.676 m), weight 210 lb 2 oz (95.3 kg).  Constitutional: overall normal hygiene, normal nutrition, well developed, normal grooming, normal body habitus. Assistive device:none  Musculoskeletal: gait and station Limp left, muscle tone and strength are normal, no tremors or atrophy is present.  .  Neurological: coordination overall normal.  Deep tendon  reflex/nerve stretch intact.  Sensation normal.  Cranial nerves II-XII intact.   Skin:   Normal overall no scars, lesions, ulcers or rashes. No psoriasis.  Psychiatric: Alert and oriented x 3.  Recent memory intact, remote memory unclear.  Normal mood and affect. Well groomed.  Good eye contact.  Cardiovascular: overall no swelling, no varicosities, no edema bilaterally, normal temperatures of the legs and arms, no clubbing, cyanosis and good capillary refill.  Lymphatic: palpation is normal.  Right knee is tender, no effusion, stable, ROM 0 to 115.  Left ankle has pain over the anterior talofibular ligament but is stable, NV intact.  Limp left.  All other systems reviewed and are negative   The patient has been educated about the nature of the problem(s) and counseled on treatment options.  The patient appeared to understand what I have discussed and is in agreement with it.  Encounter Diagnoses  Name Primary?  . Chronic pain of right knee Yes  . Chronic foot pain, left     PLAN Call if any problems.   Precautions discussed.  Continue current medications.   Return to clinic prn   He is ready to go on a cruise.   Electronically Signed Sanjuana Kava, MD 4/14/202210:22 AM

## 2020-12-04 NOTE — Progress Notes (Signed)
ok 

## 2020-12-09 ENCOUNTER — Encounter: Payer: Self-pay | Admitting: Orthopaedic Surgery

## 2020-12-09 ENCOUNTER — Telehealth: Payer: Self-pay | Admitting: Radiology

## 2020-12-09 MED ORDER — DICLOFENAC SODIUM 1 % EX GEL: CUTANEOUS | 2 refills | Status: DC

## 2020-12-09 NOTE — Telephone Encounter (Signed)
V gel

## 2021-01-06 DIAGNOSIS — X32XXXD Exposure to sunlight, subsequent encounter: Secondary | ICD-10-CM | POA: Diagnosis not present

## 2021-01-06 DIAGNOSIS — L570 Actinic keratosis: Secondary | ICD-10-CM | POA: Diagnosis not present

## 2021-01-06 DIAGNOSIS — D225 Melanocytic nevi of trunk: Secondary | ICD-10-CM | POA: Diagnosis not present

## 2021-01-06 DIAGNOSIS — D485 Neoplasm of uncertain behavior of skin: Secondary | ICD-10-CM | POA: Diagnosis not present

## 2021-02-06 DIAGNOSIS — I10 Essential (primary) hypertension: Secondary | ICD-10-CM | POA: Diagnosis not present

## 2021-02-06 DIAGNOSIS — E782 Mixed hyperlipidemia: Secondary | ICD-10-CM | POA: Diagnosis not present

## 2021-02-11 DIAGNOSIS — Z86010 Personal history of colonic polyps: Secondary | ICD-10-CM | POA: Diagnosis not present

## 2021-02-11 DIAGNOSIS — Z125 Encounter for screening for malignant neoplasm of prostate: Secondary | ICD-10-CM | POA: Diagnosis not present

## 2021-02-11 DIAGNOSIS — M7041 Prepatellar bursitis, right knee: Secondary | ICD-10-CM | POA: Diagnosis not present

## 2021-02-11 DIAGNOSIS — M545 Low back pain, unspecified: Secondary | ICD-10-CM | POA: Diagnosis not present

## 2021-02-11 DIAGNOSIS — E782 Mixed hyperlipidemia: Secondary | ICD-10-CM | POA: Diagnosis not present

## 2021-02-11 DIAGNOSIS — K5909 Other constipation: Secondary | ICD-10-CM | POA: Diagnosis not present

## 2021-02-11 DIAGNOSIS — R7301 Impaired fasting glucose: Secondary | ICD-10-CM | POA: Diagnosis not present

## 2021-02-11 DIAGNOSIS — M20011 Mallet finger of right finger(s): Secondary | ICD-10-CM | POA: Diagnosis not present

## 2021-02-11 DIAGNOSIS — K219 Gastro-esophageal reflux disease without esophagitis: Secondary | ICD-10-CM | POA: Diagnosis not present

## 2021-02-11 DIAGNOSIS — F5109 Other insomnia not due to a substance or known physiological condition: Secondary | ICD-10-CM | POA: Diagnosis not present

## 2021-02-11 DIAGNOSIS — I10 Essential (primary) hypertension: Secondary | ICD-10-CM | POA: Diagnosis not present

## 2021-02-11 DIAGNOSIS — M79671 Pain in right foot: Secondary | ICD-10-CM | POA: Diagnosis not present

## 2021-02-18 ENCOUNTER — Ambulatory Visit: Payer: PPO | Admitting: Internal Medicine

## 2021-03-31 DIAGNOSIS — X32XXXD Exposure to sunlight, subsequent encounter: Secondary | ICD-10-CM | POA: Diagnosis not present

## 2021-03-31 DIAGNOSIS — D485 Neoplasm of uncertain behavior of skin: Secondary | ICD-10-CM | POA: Diagnosis not present

## 2021-03-31 DIAGNOSIS — L570 Actinic keratosis: Secondary | ICD-10-CM | POA: Diagnosis not present

## 2021-03-31 DIAGNOSIS — D2272 Melanocytic nevi of left lower limb, including hip: Secondary | ICD-10-CM | POA: Diagnosis not present

## 2021-03-31 DIAGNOSIS — Z1283 Encounter for screening for malignant neoplasm of skin: Secondary | ICD-10-CM | POA: Diagnosis not present

## 2021-03-31 DIAGNOSIS — D225 Melanocytic nevi of trunk: Secondary | ICD-10-CM | POA: Diagnosis not present

## 2021-04-01 ENCOUNTER — Other Ambulatory Visit: Payer: Self-pay

## 2021-04-01 ENCOUNTER — Ambulatory Visit: Payer: PPO | Admitting: Internal Medicine

## 2021-04-01 ENCOUNTER — Encounter: Payer: Self-pay | Admitting: Internal Medicine

## 2021-04-01 VITALS — BP 143/87 | HR 70 | Temp 97.5°F | Ht 69.0 in | Wt 204.4 lb

## 2021-04-01 DIAGNOSIS — K5909 Other constipation: Secondary | ICD-10-CM

## 2021-04-01 DIAGNOSIS — K219 Gastro-esophageal reflux disease without esophagitis: Secondary | ICD-10-CM

## 2021-04-01 NOTE — Patient Instructions (Signed)
Stop Metamucil and Linzess  Begin Benefiber 2 tablespoons every day.  Take this fiber supplement every day whether you feel you needed or not  Begin MiraLAX 17 g at bedtime (1 capful in 8 ounces of water) as needed for bowel function.  May take every night.  Would hold off only if diarrhea occurs.  Keep a stool diary on a spare calendar  Call in in 3 weeks and let us know how you are doing  As discussed, sometimes the muscles in the pelvis get out of synchrony with bowel function and muscles do not relax at the appropriate time.  Sometimes a special test called a manometry is needed.  Plan of management today is a start.  It may be adjusted depending on your progress report in 3 weeks  We will keep you on the books for repeat colonoscopy in 2024.  It was good seeing you again today!

## 2021-04-01 NOTE — Progress Notes (Signed)
Primary Care Physician:  [name], MD Primary Gastroenterologist:  Dr.   Pre-Procedure History & Physical: HPI:  [name] is a 70 y.o. male here for further evaluation of constipation.  States been constipated since his gallbladder was removed many years ago.  May go upwards of a week without a bowel movement.  He does get the urge to have a bowel movement frequently.  He sits on the toilet and has to strain.  Has not passed any blood.  He tries various agents on demand including Metamucil, Linzess and MiraLAX but only briefly in the past.  Linzess more recently called because diarrhea.  He does not like having a loose stool and then going right back to having difficulty passing any bowel movement.  Colonoscopy 2019 left-sided diverticulosis hemorrhoids and a small adenoma from his left colon removed; due for surveillance 2024.  GERD well controlled on Protonix 40 mg daily.  No dysphagia.  Past Medical History:  Diagnosis Date   Anemia    COUPLE OF YRS AGO- NO PROBLEMS SINCE   Complication of anesthesia    REMEMBERS FEELING REALLY COLD WAKING UP   GERD (gastroesophageal reflux disease)    RARE    Hyperlipidemia    Hypertension    Migraine    Pain    RIGHT SHOULDER - ROTATOR CUFF TEAR; denies further pain 08/23/2018.    Past Surgical History:  Procedure Laterality Date   BACK SURGERY  2006   LOWER BACK FUSION - SCREWS AND RODS   BACK SURGERY  06/2017   BUNIONECTOMY WITH WEIL OSTEOTOMY Right 04/11/2020   Procedure: Right scarf and modified McBride bunionectomy, second and third metatarsal phalangeal weil osteotomy, third hammertoe correction with extensor tendon lengthening;  Surgeon: [name], MD;  Location: [place];  Service: Orthopedics;  Laterality: Right;  90 mins [facility], choice   CATARACT EXTRACTION W/PHACO Right 11/26/2015   Procedure: CATARACT EXTRACTION PHACO AND INTRAOCULAR LENS PLACEMENT (IOC);  Surgeon: [name], MD;  [facility];  Service: Ophthalmology;  Laterality: Right;  CDE:6.97   CATARACT EXTRACTION W/PHACO Left 12/10/2015   Procedure: CATARACT EXTRACTION PHACO AND INTRAOCULAR LENS PLACEMENT (IOC);  Surgeon: [name], MD;  [facility]: [facility];  Service: Ophthalmology;  Laterality: Left;  CDE:4.25   CHOLECYSTECTOMY     COLONOSCOPY WITH PROPOFOL N/A 12/02/2017   Procedure: COLONOSCOPY WITH PROPOFOL;  Surgeon: Daneil Dolin, MD;  [facility];  Service: Endoscopy;  Laterality: N/A;  8:30am   ETHMOIDECTOMY Bilateral 08/15/2019   Procedure: ETHMOIDECTOMY;  Surgeon: [name], MD;  Location: [place];  Service: ENT;  Laterality: Bilateral;   FINGERNAIL REMOVED RT INDEX FINGER - FOR CYST THAT WAS BEHIND THE NAIL     FRONTAL SINUS EXPLORATION Bilateral 08/15/2019   Procedure: FRONTAL SINUS EXPLORATION;  Surgeon: [name], MD;  Location: [place];  Service: ENT;  Laterality: Bilateral;   left thumb surgery Left 06/2018   Dr. Berdine Dance SPINE SURGERY  08/11/2017   disc repair, Dr. [name]   MAXILLARY ANTROSTOMY Bilateral 08/15/2019   Procedure: MAXILLARY ANTROSTOMY WITH TISSUE REMOVAL;  Surgeon: [name], MD;  Location: [place];  Service: ENT;  Laterality: Bilateral;   SHOULDER OPEN ROTATOR CUFF REPAIR Right 09/01/2013   Procedure: RIGHT SHOULDER MINI OPEN ROTATOR CUFF REPAIR WITH SUBACROMIAL DECOMPRESSION;  Surgeon: [name], MD;  Location: [facility];  Service: Orthopedics;  Laterality: Right;   SINUS ENDO WITH  FUSION Bilateral 08/15/2019   Procedure: SINUS ENDO WITH FUSION;  Surgeon: [name], MD;  Location: [place];  Service: ENT;  Laterality: Bilateral;   SPHENOIDECTOMY Bilateral 08/15/2019   Procedure: SPHENOIDECTOMY;  Surgeon: [name], MD;  Location: [place];  Service: ENT;  Laterality: Bilateral;   TONSILLECTOMY     SURGERY AS A CHILD   TURBINATE REDUCTION Bilateral 08/15/2019   Procedure: TURBINATE  RECESSION;  Surgeon: [name], MD;  Location: [place];  Service: ENT;  Laterality: Bilateral;    Prior to Admission medications   Medication Sig Start Date End Date Taking? Authorizing Provider  ALPRAZolam Duanne Moron) 0.5 MG tablet Take 0.5 mg by mouth at bedtime as needed for anxiety or sleep.   Yes [provider]  atorvastatin (LIPITOR) 20 MG tablet Take 20 mg by mouth daily with breakfast.    Yes [provider]  diclofenac Sodium (VOLTAREN) 1 % GEL Apply 4 grams qid 12/09/20  Yes Sanjuana Kava, MD  linaclotide [facility]) 72 MCG capsule Take 72 mcg by mouth every other day. Once every other day   Yes [provider]  Multiple Vitamins-Minerals (CENTRUM SILVER PO) Take by mouth.   Yes [provider]  naproxen (NAPROSYN) 500 MG tablet Take 1 tablet (500 mg total) by mouth 2 (two) times daily with a meal. 10/24/20  Yes Sanjuana Kava, MD  pantoprazole (PROTONIX) 40 MG tablet Take 40 mg by mouth every other day.    Yes [provider]  valsartan (DIOVAN) 320 MG tablet Take 320 mg by mouth daily.   Yes [provider]  senna (SENOKOT) 8.6 MG TABS tablet Take 2 tablets (17.2 mg total) by mouth 2 (two) times daily. Patient not taking: Reported on 04/01/2021 04/11/20   Corky Sing, PA-C    Allergies as of 04/01/2021 - Review Complete 04/01/2021  Allergen Reaction Noted   Bee venom Itching 03/06/2016   Codeine Itching 04/13/2013    Family History  Problem Relation Age of Onset   Hypertension Mother    Kidney failure Mother    Diabetes Mother    High blood pressure Father    High blood pressure Sister    Cancer Brother        brain tumor   High blood pressure Brother    Other Other    High blood pressure Brother    Migraines Daughter    Migraines Grandson    Migraines Granddaughter    Migraines Granddaughter    Colon cancer Neg Hx     Social History   Socioeconomic History   Marital status: Married     Spouse name: Not on file   Number of children: 3   Years of education: 13   Highest education level: High school graduate  Occupational History   Not on file  Tobacco Use   Smoking status: Never   Smokeless tobacco: Former    Types: Snuff    Quit date: 10/2015  Vaping Use   Vaping Use: Never used  Substance and Sexual Activity   Alcohol use: Yes    Comment: may have a beer or glass of whiskey occasionally   Drug use: No   Sexual activity: Yes    Birth control/protection: None  Other Topics Concern   Not on file  Social History Narrative   Lives at home with his wife Finneus Rasor   Right handed   Caffeine: 2 cups daily, coffee. Maybe 1 soda a day.  Social Determinants of Health   Financial Resource Strain: Not on file  Food Insecurity: Not on file  Transportation Needs: Not on file  Physical Activity: Not on file  Stress: Not on file  Social Connections: Not on file  Intimate Partner Violence: Not on file    Review of Systems: See HPI, otherwise negative ROS  Physical Exam: BP (!) 143/87   Pulse 70   Temp (!) 97.5 F (36.4 C) (Temporal)   Ht '5\' 9"'$  (1.753 m)   Wt 204 lb 6.4 oz (92.7 kg)   BMI 30.18 kg/m  General:   Alert,  Well-developed, well-nourished, pleasant and cooperative in NAD Neck:  Supple; no masses or thyromegaly. No significant cervical adenopathy. Lungs:  Clear throughout to auscultation.   No wheezes, crackles, or rhonchi. No acute distress. Heart:  Regular rate and rhythm; no murmurs, clicks, rubs,  or gallops. Abdomen: Non-distended, normal bowel sounds.  Soft and nontender without appreciable mass or hepatosplenomegaly.  Pulses:  Normal pulses noted. Extremities:  Without clubbing or edema.  Impression/Plan: 70 year old gentleman here for management of of longstanding constipation. Sounds more like he may be having a functional outlet problem (pelvic floor dyssynergy), etc. rather than slow transit constipation. His bowel regimen needs to be  optimized.  He may benefit from undergoing an anorectal manometry at some point in the future. History of colonic adenoma; no need to move up scheduled colonoscopy-2024.  Recommendations:  Stop Metamucil and Linzess  Begin Benefiber 2 tablespoons every day.  Take daily.  Begin MiraLAX 17 g at bedtime (1 capful in 8 ounces of water) as needed for bowel function -  take every night.  Would hold off only if diarrhea occurs.  Keep a stool diary on a spare calendar  Continue Protonix 40 mg daily.  Call in 3 weeks with a progress report.

## 2021-05-07 ENCOUNTER — Telehealth: Payer: Self-pay | Admitting: Internal Medicine

## 2021-05-07 NOTE — Telephone Encounter (Signed)
FYI Dr. Gala Romney.

## 2021-05-07 NOTE — Telephone Encounter (Signed)
Pt was made aware and verbalized understanding.  

## 2021-05-07 NOTE — Telephone Encounter (Signed)
Pt called to let us know that the benefiber was working and he hasn't tried the [employer].

## 2021-05-13 DIAGNOSIS — M79644 Pain in right finger(s): Secondary | ICD-10-CM | POA: Diagnosis not present

## 2021-05-21 DIAGNOSIS — U071 COVID-19: Secondary | ICD-10-CM | POA: Diagnosis not present

## 2021-05-21 DIAGNOSIS — J069 Acute upper respiratory infection, unspecified: Secondary | ICD-10-CM | POA: Diagnosis not present

## 2021-06-06 ENCOUNTER — Other Ambulatory Visit: Payer: Self-pay

## 2021-06-06 DIAGNOSIS — M25741 Osteophyte, right hand: Secondary | ICD-10-CM | POA: Diagnosis not present

## 2021-06-06 DIAGNOSIS — D480 Neoplasm of uncertain behavior of bone and articular cartilage: Secondary | ICD-10-CM | POA: Diagnosis not present

## 2021-06-06 DIAGNOSIS — M67441 Ganglion, right hand: Secondary | ICD-10-CM | POA: Diagnosis not present

## 2021-06-06 DIAGNOSIS — D1611 Benign neoplasm of short bones of right upper limb: Secondary | ICD-10-CM | POA: Diagnosis not present

## 2021-06-06 DIAGNOSIS — M71341 Other bursal cyst, right hand: Secondary | ICD-10-CM | POA: Diagnosis not present

## 2021-06-06 DIAGNOSIS — D481 Neoplasm of uncertain behavior of connective and other soft tissue: Secondary | ICD-10-CM | POA: Diagnosis not present

## 2021-06-16 DIAGNOSIS — M67441 Ganglion, right hand: Secondary | ICD-10-CM | POA: Diagnosis not present

## 2021-06-16 DIAGNOSIS — I10 Essential (primary) hypertension: Secondary | ICD-10-CM | POA: Diagnosis not present

## 2021-06-16 DIAGNOSIS — E785 Hyperlipidemia, unspecified: Secondary | ICD-10-CM | POA: Diagnosis not present

## 2021-08-05 DIAGNOSIS — L570 Actinic keratosis: Secondary | ICD-10-CM | POA: Diagnosis not present

## 2021-08-05 DIAGNOSIS — D225 Melanocytic nevi of trunk: Secondary | ICD-10-CM | POA: Diagnosis not present

## 2021-08-05 DIAGNOSIS — Z1283 Encounter for screening for malignant neoplasm of skin: Secondary | ICD-10-CM | POA: Diagnosis not present

## 2021-08-05 DIAGNOSIS — X32XXXD Exposure to sunlight, subsequent encounter: Secondary | ICD-10-CM | POA: Diagnosis not present

## 2021-08-13 DIAGNOSIS — Z125 Encounter for screening for malignant neoplasm of prostate: Secondary | ICD-10-CM | POA: Diagnosis not present

## 2021-08-13 DIAGNOSIS — R7301 Impaired fasting glucose: Secondary | ICD-10-CM | POA: Diagnosis not present

## 2021-08-13 DIAGNOSIS — E782 Mixed hyperlipidemia: Secondary | ICD-10-CM | POA: Diagnosis not present

## 2021-08-19 DIAGNOSIS — M79671 Pain in right foot: Secondary | ICD-10-CM | POA: Diagnosis not present

## 2021-08-19 DIAGNOSIS — F5109 Other insomnia not due to a substance or known physiological condition: Secondary | ICD-10-CM | POA: Diagnosis not present

## 2021-08-19 DIAGNOSIS — Z0001 Encounter for general adult medical examination with abnormal findings: Secondary | ICD-10-CM | POA: Diagnosis not present

## 2021-08-19 DIAGNOSIS — K5909 Other constipation: Secondary | ICD-10-CM | POA: Diagnosis not present

## 2021-08-19 DIAGNOSIS — M545 Low back pain, unspecified: Secondary | ICD-10-CM | POA: Diagnosis not present

## 2021-08-19 DIAGNOSIS — Z125 Encounter for screening for malignant neoplasm of prostate: Secondary | ICD-10-CM | POA: Diagnosis not present

## 2021-08-19 DIAGNOSIS — E782 Mixed hyperlipidemia: Secondary | ICD-10-CM | POA: Diagnosis not present

## 2021-08-19 DIAGNOSIS — K219 Gastro-esophageal reflux disease without esophagitis: Secondary | ICD-10-CM | POA: Diagnosis not present

## 2021-08-19 DIAGNOSIS — I10 Essential (primary) hypertension: Secondary | ICD-10-CM | POA: Diagnosis not present

## 2021-08-19 DIAGNOSIS — M7041 Prepatellar bursitis, right knee: Secondary | ICD-10-CM | POA: Diagnosis not present

## 2021-08-19 DIAGNOSIS — Z86010 Personal history of colonic polyps: Secondary | ICD-10-CM | POA: Diagnosis not present

## 2021-08-19 DIAGNOSIS — R7301 Impaired fasting glucose: Secondary | ICD-10-CM | POA: Diagnosis not present

## 2021-09-11 DIAGNOSIS — M79641 Pain in right hand: Secondary | ICD-10-CM | POA: Diagnosis not present

## 2021-09-11 DIAGNOSIS — M19041 Primary osteoarthritis, right hand: Secondary | ICD-10-CM | POA: Diagnosis not present

## 2021-09-11 DIAGNOSIS — M79642 Pain in left hand: Secondary | ICD-10-CM | POA: Diagnosis not present

## 2021-09-11 DIAGNOSIS — M19042 Primary osteoarthritis, left hand: Secondary | ICD-10-CM | POA: Diagnosis not present

## 2021-11-18 ENCOUNTER — Encounter: Payer: Self-pay | Admitting: Internal Medicine

## 2021-11-18 ENCOUNTER — Ambulatory Visit: Payer: PPO | Admitting: Internal Medicine

## 2021-11-18 VITALS — BP 130/70 | HR 64 | Temp 97.3°F | Ht 69.0 in | Wt 205.8 lb

## 2021-11-18 DIAGNOSIS — R142 Eructation: Secondary | ICD-10-CM

## 2021-11-18 DIAGNOSIS — K219 Gastro-esophageal reflux disease without esophagitis: Secondary | ICD-10-CM

## 2021-11-18 NOTE — Patient Instructions (Signed)
It was good seeing you again today! ? ?As discussed, we will proceed with a diagnostic upper endoscopy for belching (ASA 2) ? ?May increase pantoprazole to 40 mg twice daily (before breakfast and supper) every day until endoscopy has been done ? ?Continue using Metamucil every day.  It is good for your bowel function ? ?Plan for repeat colonoscopy 2024-history of colon polyps ? ?

## 2021-11-18 NOTE — Progress Notes (Signed)
? ? ?Primary Care Physician:  [name], MD ?Primary Gastroenterologist:  Dr. Gala Romney ? ?Pre-Procedure History & Physical: ?HPI:  [name] is a 71 y.o. male here for here for further evaluation of a several month history of belching.  Patient states he belches all the time.  Sometimes it is embarrassing for him.  History of GERD for which she is taking pantoprazole 40 mg every other day more recently increased to once daily without much difference.  Not much in way typical reflux symptoms.  Mild to moderate long-term alcohol use.  Denies any frank abdominal pain.  Belching seems to be worse after eating.  Long history of chewing tobacco but none in several years.  States she had an EGD with and ERCP back in 1996 in [place].  That report was retrieved EGD was said to be normal.  He has not lost any weight.  He does have alternating constipation and diarrhea.  His bowel function is well managed these days with Metamucil taken every day. ?Gallbladder long. ?Past Medical History:  ?Diagnosis Date  ? Anemia   ? COUPLE OF YRS AGO- NO PROBLEMS SINCE  ? Complication of anesthesia   ? REMEMBERS [place]  ? GERD (gastroesophageal reflux disease)   ? RARE   ? Hyperlipidemia   ? Hypertension   ? Migraine   ? Pain   ? RIGHT SHOULDER - ROTATOR CUFF TEAR; denies further pain 08/23/2018.  ? ? ?Past Surgical History:  ?Procedure Laterality Date  ? BACK SURGERY  2006  ? LOWER BACK FUSION - SCREWS AND RODS  ? BACK SURGERY  06/2017  ? BUNIONECTOMY WITH WEIL OSTEOTOMY Right 04/11/2020  ? Procedure: Right scarf and modified McBride bunionectomy, second and third metatarsal phalangeal weil osteotomy, third hammertoe correction with extensor tendon lengthening;  Surgeon: [name], MD;  Location: [place];  Service: Orthopedics;  Laterality: Right;  90 mins ?[facility], choice  ? CATARACT EXTRACTION W/PHACO Right 11/26/2015  ? Procedure: CATARACT EXTRACTION PHACO AND INTRAOCULAR LENS  PLACEMENT (IOC);  Surgeon: [name], MD;  [facility];  Service: Ophthalmology;  Laterality: Right;  CDE:6.97  ? CATARACT EXTRACTION W/PHACO Left 12/10/2015  ? Procedure: CATARACT EXTRACTION PHACO AND INTRAOCULAR LENS PLACEMENT (IOC);  Surgeon: [name], MD;  [facility];  Service: Ophthalmology;  Laterality: Left;  CDE:4.25  ? CHOLECYSTECTOMY    ? COLONOSCOPY WITH PROPOFOL N/A 12/02/2017  ? Procedure: COLONOSCOPY WITH PROPOFOL;  Surgeon: Daneil Dolin, MD;  [facility];  Service: Endoscopy;  Laterality: N/A;  8:30am  ? ETHMOIDECTOMY Bilateral 08/15/2019  ? Procedure: ETHMOIDECTOMY;  Surgeon: [name], MD;  Location: [place];  Service: ENT;  Laterality: Bilateral;  ? FINGERNAIL REMOVED RT INDEX FINGER - FOR CYST THAT WAS BEHIND THE NAIL    ? FRONTAL SINUS EXPLORATION Bilateral 08/15/2019  ? Procedure: FRONTAL SINUS EXPLORATION;  Surgeon: [name], MD;  Location: [place];  Service: ENT;  Laterality: Bilateral;  ? left thumb surgery Left 06/2018  ? Dr. [name]  ? LUMBAR SPINE SURGERY  08/11/2017  ? disc repair, Dr. [name]  ? MAXILLARY ANTROSTOMY Bilateral 08/15/2019  ? Procedure: MAXILLARY ANTROSTOMY WITH TISSUE REMOVAL;  Surgeon: [name], MD;  Location: [place];  Service: ENT;  Laterality: Bilateral;  ? SHOULDER OPEN ROTATOR CUFF REPAIR Right 09/01/2013  ? Procedure: RIGHT SHOULDER MINI OPEN ROTATOR CUFF REPAIR WITH SUBACROMIAL DECOMPRESSION;  Surgeon: [name], MD;  [facility];  Service: Orthopedics;  Laterality: Right;  ? SINUS ENDO WITH FUSION Bilateral 08/15/2019  ? Procedure: SINUS ENDO WITH FUSION;  Surgeon: [name], MD;  Location: [place];  Service: ENT;  Laterality: Bilateral;  ? SPHENOIDECTOMY Bilateral 08/15/2019  ? Procedure: SPHENOIDECTOMY;  Surgeon: [name], MD;  Location: [place];  Service: ENT;  Laterality: Bilateral;  ? TONSILLECTOMY    ? SURGERY AS A CHILD  ?  TURBINATE REDUCTION Bilateral 08/15/2019  ? Procedure: TURBINATE RECESSION;  Surgeon: [name], MD;  Location: [place];  Service: ENT;  Laterality: Bilateral;  ? ? ?Prior to Admission medications   ?Medication Sig Start Date End Date Taking? Authorizing Provider  ?ALPRAZolam (XANAX) 0.5 MG tablet Take 0.5 mg by mouth at bedtime as needed for anxiety or sleep.   Yes [provider]  ?atorvastatin (LIPITOR) 20 MG tablet Take 20 mg by mouth daily with breakfast.    Yes [provider]  ?diclofenac Sodium (VOLTAREN) 1 % GEL Apply 4 grams qid 12/09/20  Yes Sanjuana Kava, MD  ?linaclotide (LINZESS) 72 MCG capsule Take 72 mcg by mouth every other day. Once every other day   Yes [provider]  ?Multiple Vitamins-Minerals (CENTRUM SILVER PO) Take by mouth.   Yes [provider]  ?pantoprazole (PROTONIX) 40 MG tablet Take 40 mg by mouth daily.   Yes [provider]  ?valsartan (DIOVAN) 320 MG tablet Take 320 mg by mouth daily.   Yes [provider]  ?Wheat Dextrin (BENEFIBER DRINK MIX PO) Take by mouth daily as needed.   Yes [provider]  ? ? ?Allergies as of 11/18/2021 - Review Complete 11/18/2021  ?Allergen Reaction Noted  ? Bee venom Itching 03/06/2016  ? Codeine Itching 04/13/2013  ? ? ?Family History  ?Problem Relation Age of Onset  ? Hypertension Mother   ? Kidney failure Mother   ? Diabetes Mother   ? High blood pressure Father   ? High blood pressure Sister   ? Cancer Brother   ?     brain tumor  ? High blood pressure Brother   ? Other Other   ? High blood pressure Brother   ? Migraines Daughter   ? Migraines Grandson   ? Migraines Granddaughter   ? Migraines Granddaughter   ? Colon cancer Neg Hx   ? ? ?Social History  ? ?Socioeconomic History  ? Marital status: Married  ?  Spouse name: Not on file  ? Number of children: 3  ? Years of education: 72  ? Highest education level: High school graduate  ?Occupational History  ? Not on file   ?Tobacco Use  ? Smoking status: Never  ? Smokeless tobacco: Former  ?  Types: Snuff  ?  Quit date: 10/2015  ?Vaping Use  ? Vaping Use: Never used  ?Substance and Sexual Activity  ? Alcohol use: Yes  ?  Comment: may have a beer or glass of whiskey occasionally  ? Drug use: No  ? Sexual activity: Yes  ?  Birth control/protection: None  ?Other Topics Concern  ? Not on file  ?Social History Narrative  ? Lives at home with his wife Forney Kleinpeter  ? Right handed  ? Caffeine: 2 cups daily, coffee. Maybe 1 soda a day.  ? ?Social Determinants of Health  ? ?Financial Resource Strain: Not on file  ?Food Insecurity: Not on file  ?Transportation Needs: Not on file  ?Physical Activity: Not on  file  ?Stress: Not on file  ?Social Connections: Not on file  ?Intimate Partner Violence: Not on file  ? ? ?Review of Systems: ?See HPI, otherwise negative ROS ? ?Physical Exam: ?BP 130/70 (BP Location: Right Arm, Patient Position: Sitting, Cuff Size: Normal)   Pulse 64   Temp (!) 97.3 ?F (36.3 ?C) (Temporal)   Ht '5\' 9"'$  (1.753 m)   Wt 205 lb 12.8 oz (93.4 kg)   SpO2 95%   BMI 30.39 kg/m?  ?General:   Alert,  Well-developed, well-nourished, pleasant and cooperative in NAD ?Skin:  Intact without significant lesions or rashes. ?Neck:  Supple; no masses or thyromegaly. No significant cervical adenopathy. ?Lungs:  Clear throughout to auscultation.   No wheezes, crackles, or rhonchi. No acute distress. ?Heart:  Regular rate and rhythm; no murmurs, clicks, rubs,  or gallops. ?Abdomen: Non-distended, normal bowel sounds.  Soft and nontender without appreciable mass or hepatosplenomegaly.  ?Pulses:  Normal pulses noted. ?Extremities:  Without clubbing or edema. ? ?Impression/Plan: Pleasant 71 year old gentleman with a chief complaint of belching.  Long history of GERD controlled on pantoprazole.  Belching is a new issue.  There is some alcoholic and certainly tobacco exposure over time.  Last EGD was almost 30 years  ago. ? ?Recommendations: ? ?I have offered the patient a diagnostic EGD (ASA 2) the risks, benefits, limitations, alternatives and imponderables have been reviewed with the patient. Potential for esophageal dilation, biopsy, etc. have also been

## 2021-12-22 ENCOUNTER — Ambulatory Visit
Admission: EM | Admit: 2021-12-22 | Discharge: 2021-12-22 | Disposition: A | Discharge status: Home / Self Care | Payer: PPO | Attending: Nurse Practitioner | Admitting: Nurse Practitioner

## 2021-12-22 DIAGNOSIS — S0501XA Injury of conjunctiva and corneal abrasion without foreign body, right eye, initial encounter: Secondary | ICD-10-CM

## 2021-12-22 MED ORDER — TOBRAMYCIN-DEXAMETHASONE 0.3-0.1 % OP SUSP: 2.0000 [drp] | Freq: Four times a day (QID) | OPHTHALMIC | 0 refills | Status: AC

## 2021-12-22 NOTE — ED Triage Notes (Signed)
Pt states that feels like something is in right eye , thinks object blew in it on Saturday  ?

## 2021-12-22 NOTE — ED Provider Notes (Signed)
?[name] ? ? ? ?CSN: [id number] ?Arrival date & time: 12/22/21  0920 ? ? ?  ? ?History   ?Chief Complaint ?Chief Complaint  ?Patient presents with  ? Eye Problem  ? ? ?HPI ?[name] is a 71 y.o. male.  ? ?The patient is a 71 year old male who presents for problems with the right eye.  He states that he has felt "like there was something in his eye" for the past 3 days.  States he was at the ball for watching his grandchild play and felt that something flew into the eye.  He states since that time he has had irritation to the eye.  He denies change in vision, loss of vision, eye drainage, or irritation.  He does not wear contacts.  States that he wears glasses.  He states that he normally uses medication for dry eyes which has not helped. ? ?The history is provided by the patient.  ?Eye Problem ?Location:  Right eye ?Quality:  Foreign body sensation ?Severity:  Moderate ?Progression:  Waxing and waning ?Context: foreign body   ?Context: not contact lens problem   ?Relieved by:  Nothing ?Worsened by:  Eye movement ?Ineffective treatments:  Eye drops ?Associated symptoms: inflammation   ?Associated symptoms: no blurred vision, no decreased vision, no discharge, no double vision, no photophobia and no tearing   ?Risk factors: no recent URI   ? ?Past Medical History:  ?Diagnosis Date  ? Anemia   ? COUPLE OF YRS AGO- NO PROBLEMS SINCE  ? Complication of anesthesia   ? REMEMBERS [place]  ? GERD (gastroesophageal reflux disease)   ? RARE   ? Hyperlipidemia   ? Hypertension   ? Migraine   ? Pain   ? RIGHT SHOULDER - ROTATOR CUFF TEAR; denies further pain 08/23/2018.  ? ? ?Patient Active Problem List  ? Diagnosis Date Noted  ? GERD (gastroesophageal reflux disease) 10/24/2018  ? Snoring 10/11/2018  ? Right temporal headache 08/23/2018  ? Hemorrhoids 02/08/2018  ? Constipation 11/02/2017  ? Taking medication for chronic disease 11/02/2017  ? Right rotator cuff tear 09/01/2013  ?  Rotator cuff tear, right 09/01/2013  ? ? ?Past Surgical History:  ?Procedure Laterality Date  ? BACK SURGERY  2006  ? LOWER BACK FUSION - SCREWS AND RODS  ? BACK SURGERY  06/2017  ? BUNIONECTOMY WITH WEIL OSTEOTOMY Right 04/11/2020  ? Procedure: Right scarf and modified McBride bunionectomy, second and third metatarsal phalangeal weil osteotomy, third hammertoe correction with extensor tendon lengthening;  Surgeon: [name], MD;  Location: [place];  Service: Orthopedics;  Laterality: Right;  90 mins ?Regional, choice  ? CATARACT EXTRACTION W/PHACO Right 11/26/2015  ? Procedure: CATARACT EXTRACTION PHACO AND INTRAOCULAR LENS PLACEMENT (IOC);  Surgeon: [name], MD;  [facility];  Service: Ophthalmology;  Laterality: Right;  CDE:6.97  ? CATARACT EXTRACTION W/PHACO Left 12/10/2015  ? Procedure: CATARACT EXTRACTION PHACO AND INTRAOCULAR LENS PLACEMENT (IOC);  Surgeon: [name], MD;  [facility];  Service: Ophthalmology;  Laterality: Left;  CDE:4.25  ? CHOLECYSTECTOMY    ? COLONOSCOPY WITH PROPOFOL N/A 12/02/2017  ? Procedure: COLONOSCOPY WITH PROPOFOL;  Surgeon: Daneil Dolin, MD;  [facility];  Service: Endoscopy;  Laterality: N/A;  8:30am  ? ETHMOIDECTOMY Bilateral 08/15/2019  ? Procedure: ETHMOIDECTOMY;  Surgeon: [name], MD;  Location: [place];  Service: ENT;  Laterality: Bilateral;  ? FINGERNAIL REMOVED RT INDEX FINGER - FOR CYST  THAT WAS BEHIND THE NAIL    ? FRONTAL SINUS EXPLORATION Bilateral 08/15/2019  ? Procedure: FRONTAL SINUS EXPLORATION;  Surgeon: [name], MD;  Location: [place];  Service: ENT;  Laterality: Bilateral;  ? left thumb surgery Left 06/2018  ? Dr. [name]  ? LUMBAR SPINE SURGERY  08/11/2017  ? disc repair, Dr. [name]  ? MAXILLARY ANTROSTOMY Bilateral 08/15/2019  ? Procedure: MAXILLARY ANTROSTOMY WITH TISSUE REMOVAL;  Surgeon: [name], MD;  Location: [place];  Service: ENT;  Laterality:  Bilateral;  ? SHOULDER OPEN ROTATOR CUFF REPAIR Right 09/01/2013  ? Procedure: RIGHT SHOULDER MINI OPEN ROTATOR CUFF REPAIR WITH SUBACROMIAL DECOMPRESSION;  Surgeon: [name], MD;  [facility];  Service: Orthopedics;  Laterality: Right;  ? SINUS ENDO WITH FUSION Bilateral 08/15/2019  ? Procedure: SINUS ENDO WITH FUSION;  Surgeon: [name], MD;  Location: [place];  Service: ENT;  Laterality: Bilateral;  ? SPHENOIDECTOMY Bilateral 08/15/2019  ? Procedure: SPHENOIDECTOMY;  Surgeon: [name], MD;  Location: [place];  Service: ENT;  Laterality: Bilateral;  ? TONSILLECTOMY    ? SURGERY AS A CHILD  ? TURBINATE REDUCTION Bilateral 08/15/2019  ? Procedure: TURBINATE RECESSION;  Surgeon: [name], MD;  Location: [place];  Service: ENT;  Laterality: Bilateral;  ? ? ? ? ? ?Home Medications   ? ?Prior to Admission medications   ?Medication Sig Start Date End Date Taking? Authorizing Provider  ?tobramycin-dexamethasone [facility]) ophthalmic solution Place 2 drops into the right eye every 6 (six) hours for 7 days. 12/22/21 12/29/21 Yes Joan Avetisyan-Warren, Alda Lea, NP  ?ALPRAZolam (XANAX) 0.5 MG tablet Take 0.5 mg by mouth at bedtime as needed for anxiety or sleep.    [provider]  ?atorvastatin (LIPITOR) 20 MG tablet Take 20 mg by mouth daily with breakfast.     [provider]  ?diclofenac Sodium (VOLTAREN) 1 % GEL Apply 4 grams qid 12/09/20   Sanjuana Kava, MD  ?linaclotide [facility]) 72 MCG capsule Take 72 mcg by mouth every other day. Once every other day    [provider]  ?Multiple Vitamins-Minerals (CENTRUM SILVER PO) Take by mouth.    [provider]  ?pantoprazole (PROTONIX) 40 MG tablet Take 40 mg by mouth daily.    [provider]  ?valsartan (DIOVAN) 320 MG tablet Take 320 mg by mouth daily.    [provider]  ?Wheat Dextrin (BENEFIBER DRINK MIX PO) Take by mouth daily as needed.    [provider]  ? ? ?Family History ?Family History  ?Problem Relation Age of Onset  ? Hypertension Mother   ? Kidney failure Mother   ? Diabetes Mother   ? High blood pressure Father   ? High blood pressure Sister   ? Cancer Brother   ?     brain tumor  ? High blood pressure Brother   ? Other Other   ? High blood pressure Brother   ? Migraines Daughter   ? Migraines Grandson   ? Migraines Granddaughter   ? Migraines Granddaughter   ? Colon cancer Neg Hx   ? ? ?Social History ?Social History  ? ?Tobacco Use  ? Smoking status: Never  ? Smokeless tobacco: Former  ?  Types: Snuff  ?  Quit date: 10/2015  ?Vaping Use  ? Vaping Use: Never used  ?Substance Use Topics  ? Alcohol use: Yes  ?  Comment: may have a beer or  glass of whiskey occasionally  ? Drug use: No  ? ? ? ?Allergies   ?Bee venom and Codeine ? ? ?Review of Systems ?Review of Systems  ?Eyes:  Negative for blurred vision, double vision, photophobia and discharge.  ? ? ?Physical Exam ?Triage Vital Signs ?ED Triage Vitals  ?Enc Vitals Group  ?   BP 12/22/21 1124 (!) 169/101  ?   Pulse Rate 12/22/21 1124 (!) 55  ?   Resp 12/22/21 1124 20  ?   Temp 12/22/21 1124 98 ?F (36.7 ?C)  ?   Temp src --   ?   SpO2 12/22/21 1124 96 %  ?   Weight --   ?   Height --   ?   Head Circumference --   ?   Peak Flow --   ?   Pain Score 12/22/21 1121 2  ?   Pain Loc --   ?   Pain Edu? --   ?   Excl. in [place]? --   ? ?No data found. ? ?Updated Vital Signs ?BP (!) 169/101   Pulse (!) 55   Temp 98 ?F (36.7 ?C)   Resp 20   SpO2 96%  ? ?Visual Acuity ?Right Eye Distance:   ?Left Eye Distance:   ?Bilateral Distance:   ? ?Right Eye Near:   ?Left Eye Near:    ?Bilateral Near:    ? ?Physical Exam ?Vitals and nursing note reviewed.  ?HENT:  ?   Head: Normocephalic and atraumatic.  ?   Right Ear: Tympanic membrane, ear canal and external ear normal.  ?   Left Ear: Tympanic membrane, ear canal and external ear normal.  ?   Nose: Nose normal.  ?Eyes:  ?   General: Lids are normal. Vision grossly intact. No  visual field deficit.    ?   Right eye: Foreign body and discharge (clear) present.     ?   Left eye: No foreign body, discharge or hordeolum.  ?   Extraocular Movements: Extraocular movements intact.  ?   Conju

## 2021-12-22 NOTE — Discharge Instructions (Signed)
Use medication as prescribed. ?May take ibuprofen or Tylenol for pain, fever, or general discomfort. ?Do not rub or irritate the eye. ?May continue to use your eyedrops that she used for dry eyes. ?Follow-up in the emergency room if you develop change in vision, loss of vision, or worsening of symptoms. ?

## 2021-12-24 ENCOUNTER — Encounter (HOSPITAL_COMMUNITY)
Admission: RE | Disposition: A | Discharge status: Home / Self Care | Payer: Self-pay | Source: Home / Self Care | Attending: Internal Medicine

## 2021-12-24 ENCOUNTER — Ambulatory Visit (HOSPITAL_COMMUNITY)
Admission: RE | Admit: 2021-12-24 | Discharge: 2021-12-24 | Disposition: A | Discharge status: Home / Self Care | Payer: PPO | Attending: Internal Medicine | Admitting: Internal Medicine

## 2021-12-24 ENCOUNTER — Encounter (HOSPITAL_COMMUNITY): Payer: Self-pay | Admitting: Internal Medicine

## 2021-12-24 ENCOUNTER — Ambulatory Visit (HOSPITAL_BASED_OUTPATIENT_CLINIC_OR_DEPARTMENT_OTHER): Payer: PPO | Admitting: Anesthesiology

## 2021-12-24 ENCOUNTER — Ambulatory Visit (HOSPITAL_COMMUNITY): Payer: PPO | Admitting: Anesthesiology

## 2021-12-24 ENCOUNTER — Other Ambulatory Visit: Payer: Self-pay

## 2021-12-24 DIAGNOSIS — K449 Diaphragmatic hernia without obstruction or gangrene: Secondary | ICD-10-CM

## 2021-12-24 DIAGNOSIS — R142 Eructation: Secondary | ICD-10-CM | POA: Diagnosis not present

## 2021-12-24 DIAGNOSIS — D649 Anemia, unspecified: Secondary | ICD-10-CM | POA: Diagnosis not present

## 2021-12-24 DIAGNOSIS — R12 Heartburn: Secondary | ICD-10-CM

## 2021-12-24 DIAGNOSIS — K319 Disease of stomach and duodenum, unspecified: Secondary | ICD-10-CM | POA: Diagnosis not present

## 2021-12-24 DIAGNOSIS — K219 Gastro-esophageal reflux disease without esophagitis: Secondary | ICD-10-CM | POA: Insufficient documentation

## 2021-12-24 DIAGNOSIS — K529 Noninfective gastroenteritis and colitis, unspecified: Secondary | ICD-10-CM | POA: Diagnosis not present

## 2021-12-24 HISTORY — PX: BIOPSY: SHX5522

## 2021-12-24 HISTORY — PX: ESOPHAGOGASTRODUODENOSCOPY (EGD) WITH PROPOFOL: SHX5813

## 2021-12-24 SURGERY — ESOPHAGOGASTRODUODENOSCOPY (EGD) WITH PROPOFOL
Anesthesia: General

## 2021-12-24 MED ORDER — LIDOCAINE HCL (CARDIAC) PF 100 MG/5ML IV SOSY
PREFILLED_SYRINGE | INTRAVENOUS | Status: DC | PRN
Start: 2021-12-24 — End: 2021-12-24
  Administered 2021-12-24: 50 mg via INTRAVENOUS

## 2021-12-24 MED ORDER — PHENYLEPHRINE 80 MCG/ML (10ML) SYRINGE FOR IV PUSH (FOR BLOOD PRESSURE SUPPORT)
PREFILLED_SYRINGE | INTRAVENOUS | Status: DC | PRN
  Administered 2021-12-24 (×2): 80 ug via INTRAVENOUS

## 2021-12-24 MED ORDER — LACTATED RINGERS IV SOLN
INTRAVENOUS | Status: DC
  Administered 2021-12-24: 1000 mL via INTRAVENOUS

## 2021-12-24 MED ORDER — PROPOFOL 10 MG/ML IV BOLUS
INTRAVENOUS | Status: DC | PRN
  Administered 2021-12-24 (×3): 50 mg via INTRAVENOUS
  Administered 2021-12-24: 100 mg via INTRAVENOUS

## 2021-12-24 NOTE — Anesthesia Procedure Notes (Signed)
Date/Time: 12/24/2021 2:11 PM ?Performed by: Orlie Dakin, CRNA ?Pre-anesthesia Checklist: Patient identified, Emergency Drugs available, Suction available and Patient being monitored ?Patient Re-evaluated:Patient Re-evaluated prior to induction ?Oxygen Delivery Method: Nasal cannula ?Induction Type: IV induction ?Placement Confirmation: positive ETCO2 ? ? ? ? ?

## 2021-12-24 NOTE — Op Note (Signed)
[facility] ?Patient Name: [name] ?Procedure Date: 12/24/2021 1:26 PM ?MRN: [id number] ?Date of Birth: 04-22-1951 ?Attending MD: Norvel Richards , MD ?CSN: [id number] ?Age: 71 ?Admit Type: Outpatient ?Procedure:                Upper GI endoscopy ?Indications:              Heartburn, Eructation ?Providers:                Norvel Richards, MD, Janeece Riggers, RN, Levada Dy  ?                          Gwinda Passe RN, RN ?Referring MD:              ?Medicines:                Propofol per Anesthesia ?Complications:            No immediate complications. ?Estimated Blood Loss:     Estimated blood loss was minimal. ?Procedure:                Pre-Anesthesia Assessment: ?                          - Prior to the procedure, a History and Physical  ?                          was performed, and patient medications and  ?                          allergies were reviewed. The patient's tolerance of  ?                          previous anesthesia was also reviewed. The risks  ?                          and benefits of the procedure and the sedation  ?                          options and risks were discussed with the patient.  ?                          All questions were answered, and informed consent  ?                          was obtained. Prior Anticoagulants: The patient has  ?                          taken no previous anticoagulant or antiplatelet  ?                          agents. ASA Grade Assessment: II - A patient with  ?                          mild systemic disease. After reviewing the risks  ?  and benefits, the patient was deemed in  ?                          satisfactory condition to undergo the procedure. ?                          After obtaining informed consent, the endoscope was  ?                          passed under direct vision. Throughout the  ?                          procedure, the patient's blood pressure, pulse, and  ?                          oxygen  saturations were monitored continuously. The  ?                          GIF-H190 ([id number]) scope was introduced through the  ?                          mouth, and advanced to the second part of duodenum.  ?                          The upper GI endoscopy was accomplished without  ?                          difficulty. The patient tolerated the procedure  ?                          well. ?Scope In: 2:09:50 PM ?Scope Out: 2:15:33 PM ?Total Procedure Duration: 0 hours 5 minutes 43 seconds  ?Findings: ?     The examined esophagus was normal. ?     A small hiatal hernia was present. Patchy gastric erythema and antral  ?     erosions. Multiple areas of tiny mucosal hemorrhage; no ulcer or  ?     infiltrating process observed. Pylorus patent. ?     The duodenal bulb and second portion of the duodenum were normal.  ?     Abnormal gastric mucosa was biopsied with a cold forceps for histology.  ?     Estimated blood loss was minimal. ?Impression:               - Normal esophagus. ?                          - Small hiatal hernia. Abnormal stomach as  ?                          described???status post biopsy ?                          - Normal duodenal bulb and second portion of the  ?                          duodenum. Marland Kitchen ?  Moderate Sedation: ?     Moderate (conscious) sedation was personally administered by an  ?     anesthesia professional. The following parameters were monitored: oxygen  ?     saturation, heart rate, blood pressure, respiratory rate, EKG, adequacy  ?     of pulmonary ventilation, and response to care. ?Recommendation:           - Patient has a contact number available for  ?                          emergencies. The signs and symptoms of potential  ?                          delayed complications were discussed with the  ?                          patient. Return to normal activities tomorrow.  ?                          Written discharge instructions were provided to the  ?                           patient. ?                          - Advance diet as tolerated. ?                          - Continue present medications. ?                          - Await pathology results. ?                          - Return to my office (date not yet determined). ?Procedure Code(s):        --- Professional --- ?                          [phone], Esophagogastroduodenoscopy, flexible,  ?                          transoral; with biopsy, single or multiple ?Diagnosis Code(s):        --- Professional --- ?                          K44.9, Diaphragmatic hernia without obstruction or  ?                          gangrene ?                          R12, Heartburn ?                          R14.2, Eructation ?CPT copyright 2019 American Medical Association. All rights reserved. ?The codes documented in this report are preliminary and upon coder review may  ?be revised to meet current compliance requirements. ?Cristopher Estimable. Janette Harvie,  MD ?Norvel Richards, MD ?12/24/2021 2:28:32 PM ?This report has been signed electronically. ?Number of Addenda: 0 ?

## 2021-12-24 NOTE — Transfer of Care (Signed)
Immediate Anesthesia Transfer of Care Note ? ?Patient: [name] ? ?Procedure(s) Performed: ESOPHAGOGASTRODUODENOSCOPY (EGD) WITH PROPOFOL ?BIOPSY ? ?Patient Location: Endoscopy Unit ? ?Anesthesia Type:General ? ?Level of Consciousness: drowsy ? ?Airway & Oxygen Therapy: Patient Spontanous Breathing ? ?Post-op Assessment: Report given to RN and Post -op Vital signs reviewed and stable ? ?Post vital signs: Reviewed and stable ? ?Last Vitals:  ?Vitals Value Taken Time  ?BP 83/28 12/24/21 1419  ?Temp 36.2 ?C 12/24/21 1419  ?Pulse 77 12/24/21 1419  ?Resp 22 12/24/21 1419  ?SpO2 96 % 12/24/21 1419  ? ? ?Last Pain:  ?Vitals:  ? 12/24/21 1419  ?TempSrc: Axillary  ?PainSc:   ?   ? ?Patients Stated Pain Goal: 7 (12/24/21 1348) ? ?Complications: No notable events documented. ?

## 2021-12-24 NOTE — Anesthesia Preprocedure Evaluation (Signed)
Anesthesia Evaluation  ?Patient identified by MRN, date of birth, ID band ?Patient awake ? ? ? ?Reviewed: ?Allergy & Precautions, H&P , NPO status , Patient's Chart, lab work & pertinent test results, reviewed documented beta blocker date and time  ? ?Airway ?Mallampati: II ? ?TM Distance: >3 FB ?Neck ROM: full ? ? ? Dental ?no notable dental hx. ? ?  ?Pulmonary ?neg pulmonary ROS,  ?  ?Pulmonary exam normal ?breath sounds clear to auscultation ? ? ? ? ? ? Cardiovascular ?Exercise Tolerance: Good ?hypertension, negative cardio ROS ? ? ?Rhythm:regular Rate:Normal ? ? ?  ?Neuro/Psych ? Headaches, negative psych ROS  ? GI/Hepatic ?Neg liver ROS, GERD  Medicated,  ?Endo/Other  ?negative endocrine ROS ? Renal/GU ?negative Renal ROS  ?negative genitourinary ?  ?Musculoskeletal ? ? Abdominal ?  ?Peds ? Hematology ? ?(+) Blood dyscrasia, anemia ,   ?Anesthesia Other Findings ? ? Reproductive/Obstetrics ?negative OB ROS ? ?  ? ? ? ? ? ? ? ? ? ? ? ? ? ?  ?  ? ? ? ? ? ? ? ? ?Anesthesia Physical ?Anesthesia Plan ? ?ASA: 2 ? ?Anesthesia Plan: General  ? ?Post-op Pain Management:   ? ?Induction:  ? ?PONV Risk Score and Plan: Propofol infusion ? ?Airway Management Planned:  ? ?Additional Equipment:  ? ?Intra-op Plan:  ? ?Post-operative Plan:  ? ?Informed Consent: I have reviewed the patients History and Physical, chart, labs and discussed the procedure including the risks, benefits and alternatives for the proposed anesthesia with the patient or authorized representative who has indicated his/her understanding and acceptance.  ? ? ? ?Dental Advisory Given ? ?Plan Discussed with: CRNA ? ?Anesthesia Plan Comments:   ? ? ? ? ? ? ?Anesthesia Quick Evaluation ? ?

## 2021-12-24 NOTE — H&P (Signed)
$'@LOGO'g$ @ ? ? ?Primary Care Physician:  [name], MD ?Primary Gastroenterologist:  Dr. Gala Romney ? ?Pre-Procedure History & Physical: ?HPI:  [name] is a 71 y.o. male here for diagnostic EGD.  Patient has refractory belching.  GERD reportedly well controlled denies dysphagia.  Gallbladder out. ? ?Past Medical History:  ?Diagnosis Date  ? Anemia   ? COUPLE OF YRS AGO- NO PROBLEMS SINCE  ? Complication of anesthesia   ? REMEMBERS [place]  ? GERD (gastroesophageal reflux disease)   ? RARE   ? Hyperlipidemia   ? Hypertension   ? Migraine   ? Pain   ? RIGHT SHOULDER - ROTATOR CUFF TEAR; denies further pain 08/23/2018.  ? ? ?Past Surgical History:  ?Procedure Laterality Date  ? BACK SURGERY  2006  ? LOWER BACK FUSION - SCREWS AND RODS  ? BACK SURGERY  06/2017  ? BUNIONECTOMY WITH WEIL OSTEOTOMY Right 04/11/2020  ? Procedure: Right scarf and modified McBride bunionectomy, second and third metatarsal phalangeal weil osteotomy, third hammertoe correction with extensor tendon lengthening;  Surgeon: [name], MD;  Location: [place];  Service: Orthopedics;  Laterality: Right;  90 mins ?Regional, choice  ? CATARACT EXTRACTION W/PHACO Right 11/26/2015  ? Procedure: CATARACT EXTRACTION PHACO AND INTRAOCULAR LENS PLACEMENT (IOC);  Surgeon: [name], MD;  [facility];  Service: Ophthalmology;  Laterality: Right;  CDE:6.97  ? CATARACT EXTRACTION W/PHACO Left 12/10/2015  ? Procedure: CATARACT EXTRACTION PHACO AND INTRAOCULAR LENS PLACEMENT (IOC);  Surgeon: [name], MD;  [facility];  Service: Ophthalmology;  Laterality: Left;  CDE:4.25  ? CHOLECYSTECTOMY    ? COLONOSCOPY WITH PROPOFOL N/A 12/02/2017  ? Procedure: COLONOSCOPY WITH PROPOFOL;  Surgeon: Daneil Dolin, MD;  [facility];  Service: Endoscopy;  Laterality: N/A;  8:30am  ? ETHMOIDECTOMY Bilateral 08/15/2019  ? Procedure: ETHMOIDECTOMY;  Surgeon: [name], MD;  Location: [place];   Service: ENT;  Laterality: Bilateral;  ? FINGERNAIL REMOVED RT INDEX FINGER - FOR CYST THAT WAS BEHIND THE NAIL    ? FRONTAL SINUS EXPLORATION Bilateral 08/15/2019  ? Procedure: FRONTAL SINUS EXPLORATION;  Surgeon: [name], MD;  Location: [place];  Service: ENT;  Laterality: Bilateral;  ? left thumb surgery Left 06/2018  ? Dr. [name]  ? LUMBAR SPINE SURGERY  08/11/2017  ? disc repair, Dr. [name]  ? MAXILLARY ANTROSTOMY Bilateral 08/15/2019  ? Procedure: MAXILLARY ANTROSTOMY WITH TISSUE REMOVAL;  Surgeon: [name], MD;  Location: [place];  Service: ENT;  Laterality: Bilateral;  ? SHOULDER OPEN ROTATOR CUFF REPAIR Right 09/01/2013  ? Procedure: RIGHT SHOULDER MINI OPEN ROTATOR CUFF REPAIR WITH SUBACROMIAL DECOMPRESSION;  Surgeon: [name], MD;  [facility];  Service: Orthopedics;  Laterality: Right;  ? SINUS ENDO WITH FUSION Bilateral 08/15/2019  ? Procedure: SINUS ENDO WITH FUSION;  Surgeon: [name], MD;  Location: [place];  Service: ENT;  Laterality: Bilateral;  ? SPHENOIDECTOMY Bilateral 08/15/2019  ? Procedure: SPHENOIDECTOMY;  Surgeon: [name], MD;  Location: [place];  Service: ENT;  Laterality: Bilateral;  ? TONSILLECTOMY    ? SURGERY AS A CHILD  ? TURBINATE REDUCTION Bilateral 08/15/2019  ? Procedure: TURBINATE RECESSION;  Surgeon: [name], MD;  Location: [place];  Service: ENT;  Laterality: Bilateral;  ? ? ?Prior to Admission medications   ?Medication Sig Start Date End Date Taking? Authorizing Provider  ?acetaminophen (TYLENOL) 500  MG tablet Take 1,000 mg by mouth every 6 (six) hours as needed for moderate pain.   Yes [provider]  ?atorvastatin (LIPITOR) 20 MG tablet Take 20 mg by mouth daily with breakfast.    Yes [provider]  ?ibuprofen (ADVIL) 200 MG tablet Take 200 mg by mouth every 6 (six) hours as needed for moderate pain.   Yes [provider]  ?LORazepam  (ATIVAN) 0.5 MG tablet Take 0.5 mg by mouth at bedtime as needed for sleep.   Yes [provider]  ?METAMUCIL FIBER PO Take 1 Scoop by mouth every other day. Alternates with benefiber   Yes [provider]  ?Multiple Vitamins-Minerals (CENTRUM SILVER PO) Take 1 tablet by mouth daily.   Yes [provider]  ?pantoprazole (PROTONIX) 40 MG tablet Take 40 mg by mouth daily.   Yes [provider]  ?valsartan (DIOVAN) 320 MG tablet Take 320 mg by mouth daily.   Yes [provider]  ?Wheat Dextrin (BENEFIBER DRINK MIX PO) Take 1 Scoop by mouth every other day.   Yes [provider]  ?diclofenac Sodium (VOLTAREN) 1 % GEL Apply 4 grams qid ?Patient taking differently: Apply 1 application. topically 4 (four) times daily as needed (pain). 12/09/20   Sanjuana Kava, MD  ?tobramycin-dexamethasone [facility]) ophthalmic solution Place 2 drops into the right eye every 6 (six) hours for 7 days. 12/22/21 12/29/21  Leath-Warren, Alda Lea, NP  ? ? ?Allergies as of 11/18/2021 - Review Complete 11/18/2021  ?Allergen Reaction Noted  ? Bee venom Itching 03/06/2016  ? Codeine Itching 04/13/2013  ? ? ?Family History  ?Problem Relation Age of Onset  ? Hypertension Mother   ? Kidney failure Mother   ? Diabetes Mother   ? High blood pressure Father   ? High blood pressure Sister   ? Cancer Brother   ?     brain tumor  ? High blood pressure Brother   ? Other Other   ? High blood pressure Brother   ? Migraines Daughter   ? Migraines Grandson   ? Migraines Granddaughter   ? Migraines Granddaughter   ? Colon cancer Neg Hx   ? ? ?Social History  ? ?Socioeconomic History  ? Marital status: Married  ?  Spouse name: Not on file  ? Number of children: 3  ? Years of education: 41  ? Highest education level: High school graduate  ?Occupational History  ? Not on file  ?Tobacco Use  ? Smoking status: Never  ? Smokeless tobacco: Former  ?  Types: Snuff  ?  Quit date: 10/2015  ?Vaping Use  ? Vaping Use:  Never used  ?Substance and Sexual Activity  ? Alcohol use: Yes  ?  Comment: may have a beer or glass of whiskey occasionally  ? Drug use: No  ? Sexual activity: Yes  ?  Birth control/protection: None  ?Other Topics Concern  ? Not on file  ?Social History Narrative  ? Lives at home with his wife Taydon Nasworthy  ? Right handed  ? Caffeine: 2 cups daily, coffee. Maybe 1 soda a day.  ? ?Social Determinants of Health  ? ?Financial Resource Strain: Not on file  ?Food Insecurity: Not on file  ?Transportation Needs: Not on file  ?Physical Activity: Not on file  ?Stress: Not on file  ?Social Connections: Not on file  ?Intimate Partner Violence: Not on file  ? ? ?Review of Systems: ?See HPI, otherwise negative ROS ? ?Physical Exam: ?BP 115/83  Pulse 77   Temp (!) 97.5 ?F (36.4 ?C) (Oral)   Resp 15   Ht '5\' 9"'$  (1.753 m)   Wt 96.2 kg   SpO2 97%   BMI 31.31 kg/m?  ?General:   Alert,  Well-developed, well-nourished, pleasant and cooperative in NAD ?Neck:  Supple; no masses or thyromegaly. No significant cervical adenopathy. ?Lungs:  Clear throughout to auscultation.   No wheezes, crackles, or rhonchi. No acute distress. ?Heart:  Regular rate and rhythm; no murmurs, clicks, rubs,  or gallops. ?Abdomen: Non-distended, normal bowel sounds.  Soft and nontender without appreciable mass or hepatosplenomegaly.  ?Pulses:  Normal pulses noted. ?Extremities:  Without clubbing or edema. ? ?Impression/Plan: 71 year old gentleman with belching and GERD.  No dysphagia. ?Daily belching quite bothersome.  Diagnostic EGD to further evaluate. ?The risks, benefits, limitations, alternatives and imponderables have been reviewed with the patient. Potential for esophageal dilation, biopsy, etc. have also been reviewed.  Questions have been answered. All parties agreeable.  ? ? ? ? ?Notice: This dictation was prepared with Dragon dictation along with smaller phrase technology. Any transcriptional errors that result from this process are  unintentional and may not be corrected upon review.  ? ?

## 2021-12-24 NOTE — Discharge Instructions (Addendum)
EGD ?Discharge instructions ?Please read the instructions outlined below and refer to this sheet in the next few weeks. These discharge instructions provide you with general information on caring for yourself after you leave the hospital. Your doctor may also give you specific instructions. While your treatment has been planned according to the most current medical practices available, unavoidable complications occasionally occur. If you have any problems or questions after discharge, please call your doctor. ?ACTIVITY ?You may resume your regular activity but move at a slower pace for the next 24 hours.  ?Take frequent rest periods for the next 24 hours.  ?Walking will help expel (get rid of) the air and reduce the bloated feeling in your abdomen.  ?No driving for 24 hours (because of the anesthesia (medicine) used during the test).  ?You may shower.  ?Do not sign any important legal documents or operate any machinery for 24 hours (because of the anesthesia used during the test).  ?NUTRITION ?Drink plenty of fluids.  ?You may resume your normal diet.  ?Begin with a light meal and progress to your normal diet.  ?Avoid alcoholic beverages for 24 hours or as instructed by your caregiver.  ?MEDICATIONS ?You may resume your normal medications unless your caregiver tells you otherwise.  ?WHAT YOU CAN EXPECT TODAY ?You may experience abdominal discomfort such as a feeling of fullness or ?gas? pains.  ?FOLLOW-UP ?Your doctor will discuss the results of your test with you.  ?SEEK IMMEDIATE MEDICAL ATTENTION IF ANY OF THE FOLLOWING OCCUR: ?Excessive nausea (feeling sick to your stomach) and/or vomiting.  ?Severe abdominal pain and distention (swelling).  ?Trouble swallowing.  ?Temperature over 101? F (37.8? C).  ?Rectal bleeding or vomiting of blood.   ? ?Your stomach was inflamed appearing.  No ulcer or tumor found.  Biopsies were taken. ? ?Further recommendations to follow the first of the week when biopsy results are  available for review ? ?At patient request, I called Cathleen Corti at [phone] findings and recommendations ?

## 2021-12-25 NOTE — Anesthesia Postprocedure Evaluation (Signed)
Anesthesia Post Note ? ?Patient: [name] ? ?Procedure(s) Performed: ESOPHAGOGASTRODUODENOSCOPY (EGD) WITH PROPOFOL ?BIOPSY ? ?Patient location during evaluation: Phase II ?Anesthesia Type: General ?Level of consciousness: awake ?Pain management: pain level controlled ?Vital Signs Assessment: post-procedure vital signs reviewed and stable ?Respiratory status: spontaneous breathing and respiratory function stable ?Cardiovascular status: blood pressure returned to baseline and stable ?Postop Assessment: no headache and no apparent nausea or vomiting ?Anesthetic complications: no ?Comments: Late entry ? ? ?No notable events documented. ? ? ?Last Vitals:  ?Vitals:  ? 12/24/21 1419 12/24/21 1424  ?BP: (!) 83/28 101/70  ?Pulse: 77   ?Resp: (!) 22   ?Temp: (!) 36.2 ?C   ?SpO2: 96%   ?  ?Last Pain:  ?Vitals:  ? 12/24/21 1424  ?TempSrc:   ?PainSc: 0-No pain  ? ? ?  ?  ?  ?  ?  ?  ? ?Louann Sjogren ? ? ? ? ?

## 2021-12-26 LAB — SURGICAL PATHOLOGY

## 2021-12-30 ENCOUNTER — Other Ambulatory Visit: Payer: Self-pay

## 2021-12-30 MED ORDER — DEXLANSOPRAZOLE 60 MG PO CPDR: 60.0000 mg | DELAYED_RELEASE_CAPSULE | Freq: Every day | ORAL | 0 refills | Status: DC

## 2021-12-31 ENCOUNTER — Encounter (HOSPITAL_COMMUNITY): Payer: Self-pay | Admitting: Internal Medicine

## 2022-01-01 ENCOUNTER — Telehealth: Payer: Self-pay | Admitting: Emergency Medicine

## 2022-01-01 ENCOUNTER — Telehealth: Payer: Self-pay | Admitting: Nurse Practitioner

## 2022-01-01 MED ORDER — OFLOXACIN 0.3 % OP SOLN: 2.0000 [drp] | Freq: Four times a day (QID) | OPHTHALMIC | 0 refills | Status: AC

## 2022-01-01 NOTE — Telephone Encounter (Signed)
Patient called requesting alternative for Tobradex previously prescribed. Ocuflox sent to patient's preferred pharmacy.

## 2022-01-01 NOTE — Telephone Encounter (Signed)
Pt called and stated previous px cost too much and was unable to fill. Consulted [facility] provider and electronically sent another prescription in. Pt called and notified previous px cancelled and new prescription sent. Pt verbalized understanding.

## 2022-02-09 ENCOUNTER — Telehealth: Payer: Self-pay

## 2022-02-09 ENCOUNTER — Other Ambulatory Visit: Payer: Self-pay

## 2022-02-09 MED ORDER — ESOMEPRAZOLE MAGNESIUM 40 MG PO CPDR: 40.0000 mg | DELAYED_RELEASE_CAPSULE | Freq: Every day | ORAL | 11 refills | Status: DC

## 2022-02-09 NOTE — Telephone Encounter (Signed)
Pt called stating that the dexilant that he was prescribed after his procedure has helped some, but that he is still having some belching issues, but states that it is not as bad as it was. Wants to know if you have any other recommendations to help with the belching.

## 2022-02-09 NOTE — Telephone Encounter (Signed)
Pt was made aware and verbalized understanding.  

## 2022-02-11 DIAGNOSIS — R7301 Impaired fasting glucose: Secondary | ICD-10-CM | POA: Diagnosis not present

## 2022-02-11 DIAGNOSIS — E782 Mixed hyperlipidemia: Secondary | ICD-10-CM | POA: Diagnosis not present

## 2022-02-24 DIAGNOSIS — D225 Melanocytic nevi of trunk: Secondary | ICD-10-CM | POA: Diagnosis not present

## 2022-02-24 DIAGNOSIS — M7041 Prepatellar bursitis, right knee: Secondary | ICD-10-CM | POA: Diagnosis not present

## 2022-02-24 DIAGNOSIS — Z125 Encounter for screening for malignant neoplasm of prostate: Secondary | ICD-10-CM | POA: Diagnosis not present

## 2022-02-24 DIAGNOSIS — F5109 Other insomnia not due to a substance or known physiological condition: Secondary | ICD-10-CM | POA: Diagnosis not present

## 2022-02-24 DIAGNOSIS — R7301 Impaired fasting glucose: Secondary | ICD-10-CM | POA: Diagnosis not present

## 2022-02-24 DIAGNOSIS — M20011 Mallet finger of right finger(s): Secondary | ICD-10-CM | POA: Diagnosis not present

## 2022-02-24 DIAGNOSIS — M545 Low back pain, unspecified: Secondary | ICD-10-CM | POA: Diagnosis not present

## 2022-02-24 DIAGNOSIS — I10 Essential (primary) hypertension: Secondary | ICD-10-CM | POA: Diagnosis not present

## 2022-02-24 DIAGNOSIS — Z86010 Personal history of colonic polyps: Secondary | ICD-10-CM | POA: Diagnosis not present

## 2022-02-24 DIAGNOSIS — K219 Gastro-esophageal reflux disease without esophagitis: Secondary | ICD-10-CM | POA: Diagnosis not present

## 2022-02-24 DIAGNOSIS — K5909 Other constipation: Secondary | ICD-10-CM | POA: Diagnosis not present

## 2022-02-24 DIAGNOSIS — M79671 Pain in right foot: Secondary | ICD-10-CM | POA: Diagnosis not present

## 2022-02-24 DIAGNOSIS — Z1283 Encounter for screening for malignant neoplasm of skin: Secondary | ICD-10-CM | POA: Diagnosis not present

## 2022-02-24 DIAGNOSIS — E782 Mixed hyperlipidemia: Secondary | ICD-10-CM | POA: Diagnosis not present

## 2022-03-04 ENCOUNTER — Telehealth: Payer: Self-pay

## 2022-03-04 NOTE — Telephone Encounter (Signed)
Pt was made aware and verbalized understanding. Transferred to the front to schedule an appt with an app.

## 2022-03-04 NOTE — Telephone Encounter (Signed)
Pt called stating the Dexilant and the FD Guard are not controlling his belching/burping. Pt states that it happens no matter if he is eating or not.

## 2022-03-15 NOTE — Progress Notes (Unsigned)
Referring Provider: [name], MD Primary Care Physician:  [name], MD Primary GI Physician: Dr. Gala Romney  No chief complaint on file.   HPI:   [name] is a 71 y.o. male presenting today for follow-up of GERD and belching.    Chronically on Pantoprazole 40 mg every other day and increased to daily without improvement in belching.   Seen in our office last on 11/18/21. He had increased Protonix to 40 mg daily without improvement in belching. No significant typical reflux symptoms. Mild to moderate long-term alcohol use.  Denied any frank abdominal pain.  Belching seems to be worse after eating.  Long history of chewing tobacco but none in several years. Chronic alternating constipation and diarrhea well managed with Metamucil. Recommended increasing Protonix to 40 mg BID and proceeding with EGD.   EGD:  Patient called 02/09/22 reporting Dexilant had helped some with belching. Still with some symptoms. Recommended adding FD guard or peppermint oil supplement. Patient then asked if he could go back on Nexium as he felt this worked better than pantoprazole and Dexilant.  Insurance previously would not pay, but he was hoping it would at this time as he has failed 2 other agents. He was prescribed Nexium 40 mg daily.   Today:    Alcohol:  Carbonated beverages:  Chewing gum:  Drinking through a straw:  Eating pace:   ?baclofen We initiate baclofen at a low dose (5 to 10 mg twice a day before meals).   somnolence, confusion, dizziness, lightheadedness, drowsiness, weakness, and trembling. We usually continue baclofen for four to eight weeks before stopping if it is ineffective.   Past Medical History:  Diagnosis Date   Anemia    COUPLE OF YRS AGO- NO PROBLEMS SINCE   Complication of anesthesia    REMEMBERS FEELING REALLY COLD WAKING UP   GERD (gastroesophageal reflux disease)    RARE    Hyperlipidemia    Hypertension    Migraine    Pain    RIGHT SHOULDER - ROTATOR  CUFF TEAR; denies further pain 08/23/2018.    Past Surgical History:  Procedure Laterality Date   BACK SURGERY  2006   LOWER BACK FUSION - SCREWS AND RODS   BACK SURGERY  06/2017   BIOPSY  12/24/2021   Procedure: BIOPSY;  Surgeon: Daneil Dolin, MD;  [facility];  Service: Endoscopy;;   BUNIONECTOMY WITH WEIL OSTEOTOMY Right 04/11/2020   Procedure: Right scarf and modified McBride bunionectomy, second and third metatarsal phalangeal weil osteotomy, third hammertoe correction with extensor tendon lengthening;  Surgeon: [name], MD;  Location: [facility];  Service: Orthopedics;  Laterality: Right;  [facility], choice   CATARACT EXTRACTION W/PHACO Right 11/26/2015   Procedure: CATARACT EXTRACTION PHACO AND INTRAOCULAR LENS PLACEMENT (IOC);  Surgeon: [name], MD;  Location: [facility];  Service: Ophthalmology;  Laterality: Right;  CDE:6.97   CATARACT EXTRACTION W/PHACO Left 12/10/2015   Procedure: CATARACT EXTRACTION PHACO AND INTRAOCULAR LENS PLACEMENT (IOC);  Surgeon: [name], MD;  Location: [facility];  Service: Ophthalmology;  Laterality: Left;  CDE:4.25   CHOLECYSTECTOMY     COLONOSCOPY WITH PROPOFOL N/A 12/02/2017   Procedure: COLONOSCOPY WITH PROPOFOL;  Surgeon: Daneil Dolin, MD;  [facility];  Service: Endoscopy;  Laterality: N/A;  8:30am   ESOPHAGOGASTRODUODENOSCOPY (EGD) WITH PROPOFOL N/A 12/24/2021   Procedure: ESOPHAGOGASTRODUODENOSCOPY (EGD) WITH PROPOFOL;  Surgeon: Daneil Dolin, MD;  [facility];  Service:  Endoscopy;  Laterality: N/A;  3:00pm   ETHMOIDECTOMY Bilateral 08/15/2019   Procedure: ETHMOIDECTOMY;  Surgeon: [name], MD;  Location: [facility];  Service: ENT;  Laterality: Bilateral;   FINGERNAIL REMOVED RT INDEX FINGER - FOR CYST THAT WAS BEHIND THE NAIL     FRONTAL SINUS EXPLORATION Bilateral 08/15/2019   Procedure: FRONTAL SINUS EXPLORATION;  Surgeon: [name], MD;  Location: [place];  Service: ENT;  Laterality: Bilateral;   left thumb surgery Left 06/2018   Dr. Berdine Dance SPINE SURGERY  08/11/2017   disc repair, Dr. [name]   MAXILLARY ANTROSTOMY Bilateral 08/15/2019   Procedure: MAXILLARY ANTROSTOMY WITH TISSUE REMOVAL;  Surgeon: [name], MD;  Location: [facility];  Service: ENT;  Laterality: Bilateral;   SHOULDER OPEN ROTATOR CUFF REPAIR Right 09/01/2013   Procedure: RIGHT SHOULDER MINI OPEN ROTATOR CUFF REPAIR WITH SUBACROMIAL DECOMPRESSION;  Surgeon: [name], MD;  Location: [facility];  Service: Orthopedics;  Laterality: Right;   SINUS ENDO WITH FUSION Bilateral 08/15/2019   Procedure: SINUS ENDO WITH FUSION;  Surgeon: [name], MD;  Location: [place];  Service: ENT;  Laterality: Bilateral;   SPHENOIDECTOMY Bilateral 08/15/2019   Procedure: SPHENOIDECTOMY;  Surgeon: [name], MD;  Location: [facility];  Service: ENT;  Laterality: Bilateral;   TONSILLECTOMY     SURGERY AS A CHILD   TURBINATE REDUCTION Bilateral 08/15/2019   Procedure: TURBINATE RECESSION;  Surgeon: [name], MD;  Location: [facility];  Service: ENT;  Laterality: Bilateral;    Current Outpatient Medications  Medication Sig Dispense Refill   acetaminophen (TYLENOL) 500 MG tablet Take 1,000 mg by mouth every 6 (six) hours as needed for moderate pain.     atorvastatin (LIPITOR) 20 MG tablet Take 20 mg by mouth daily with breakfast.      diclofenac Sodium (VOLTAREN) 1 % GEL Apply 4 grams qid (Patient taking differently: Apply 1 application. topically 4 (four) times daily as needed (pain).) 500 g 2   esomeprazole (NEXIUM) 40 MG capsule Take 1 capsule (40 mg total) by mouth daily before breakfast. 30 capsule 11   ibuprofen (ADVIL) 200 MG tablet Take 200 mg by mouth every 6 (six) hours as needed for moderate pain.     LORazepam (ATIVAN) 0.5 MG tablet Take 0.5 mg by mouth at bedtime as needed for sleep.     METAMUCIL  FIBER PO Take 1 Scoop by mouth every other day. Alternates with benefiber     Multiple Vitamins-Minerals (CENTRUM SILVER PO) Take 1 tablet by mouth daily.     valsartan (DIOVAN) 320 MG tablet Take 320 mg by mouth daily.     Wheat Dextrin (BENEFIBER DRINK MIX PO) Take 1 Scoop by mouth every other day.     No current facility-administered medications for this visit.    Allergies as of 03/16/2022 - Review Complete 12/24/2021  Allergen Reaction Noted   Bee venom Itching 03/06/2016   Codeine Itching 04/13/2013    Family History  Problem Relation Age of Onset   Hypertension Mother    Kidney failure Mother    Diabetes Mother    High blood pressure Father    High blood pressure Sister    Cancer Brother        brain tumor   High blood pressure Brother    Other Other    High blood pressure Brother    Migraines Daughter    Migraines Grandson  Migraines Granddaughter    Migraines Granddaughter    Colon cancer Neg Hx     Social History   Socioeconomic History   Marital status: Married    Spouse name: Not on file   Number of children: 3   Years of education: 13   Highest education level: High school graduate  Occupational History   Not on file  Tobacco Use   Smoking status: Never   Smokeless tobacco: Former    Types: Snuff    Quit date: 10/2015  Vaping Use   Vaping Use: Never used  Substance and Sexual Activity   Alcohol use: Yes    Comment: may have a beer or glass of whiskey occasionally   Drug use: No   Sexual activity: Yes    Birth control/protection: None  Other Topics Concern   Not on file  Social History Narrative   Lives at home with his wife Kennedy Bohanon   Right handed   Caffeine: 2 cups daily, coffee. Maybe 1 soda a day.   Social Determinants of Health   Financial Resource Strain: Not on file  Food Insecurity: Not on file  Transportation Needs: Not on file  Physical Activity: Not on file  Stress: Not on file  Social Connections: Not on file     Review of Systems: Gen: Denies fever, chills,cold or flu like symptoms, pre-syncope, or syncope.  CV: Denies chest pain, palpitations. Resp: Denies dyspnea, cough.  GI: See HPI Heme: See HPI  Physical Exam: There were no vitals taken for this visit. General:   Alert and oriented. No distress noted. Pleasant and cooperative.  Head:  Normocephalic and atraumatic. Eyes:  Conjuctiva clear without scleral icterus. Heart:  S1, S2 present without murmurs appreciated. Lungs:  Clear to auscultation bilaterally. No wheezes, rales, or rhonchi. No distress.  Abdomen:  +BS, soft, non-tender and non-distended. No rebound or guarding. No HSM or masses noted. Msk:  Symmetrical without gross deformities. Normal posture. Extremities:  Without edema. Neurologic:  Alert and  oriented x4 Psych:  Normal mood and affect.    Assessment:     Plan:  ***   Aliene Altes, PA-C [facility] 03/16/2022

## 2022-03-16 ENCOUNTER — Ambulatory Visit (INDEPENDENT_AMBULATORY_CARE_PROVIDER_SITE_OTHER): Payer: PPO | Admitting: Gastroenterology

## 2022-03-16 ENCOUNTER — Encounter: Payer: Self-pay | Admitting: Gastroenterology

## 2022-03-16 VITALS — BP 138/82 | HR 72 | Temp 97.4°F | Ht 69.0 in | Wt 205.8 lb

## 2022-03-16 DIAGNOSIS — R142 Eructation: Secondary | ICD-10-CM

## 2022-03-16 DIAGNOSIS — K2970 Gastritis, unspecified, without bleeding: Secondary | ICD-10-CM | POA: Diagnosis not present

## 2022-03-16 DIAGNOSIS — K219 Gastro-esophageal reflux disease without esophagitis: Secondary | ICD-10-CM | POA: Diagnosis not present

## 2022-03-16 MED ORDER — BACLOFEN 5 MG PO TABS: 5.0000 mg | ORAL_TABLET | Freq: Two times a day (BID) | ORAL | 3 refills | Status: DC

## 2022-03-16 MED ORDER — LANSOPRAZOLE 30 MG PO CPDR: 30.0000 mg | DELAYED_RELEASE_CAPSULE | Freq: Every day | ORAL | 3 refills | Status: DC

## 2022-03-16 NOTE — Patient Instructions (Addendum)
Start lansoprazole 30 mg daily 30 minutes before breakfast.  Start baclofen 5 mg twice daily before meals to see if this will help with your belching. Monitor for side effects including confusion, dizziness, lightheadedness, drowsiness, weakness, trembling, and let me know if this occurs.  Avoid all carbonated beverages for now.  Avoid all alcohol for now.  Avoid drinking through a straw.  Take your time while eating and ensure that you are chewing thoroughly.  Avoid talking while eating as you may be swallowing excessive air.  We will plan to follow-up with you in about 3 months.  Do not hesitate to call if you have any questions or concerns prior to your next visit.  It was nice meeting you today!   Aliene Altes, PA-C [facility]

## 2022-04-10 ENCOUNTER — Telehealth: Payer: Self-pay | Admitting: *Deleted

## 2022-04-10 NOTE — Telephone Encounter (Signed)
Spoke to pt, he informed me that he is still doing a lot of burping. He states the medications you put him on at his last OV are not helping him. He was not sure which medication it was. But states nothing is working for him. Please advise.

## 2022-04-10 NOTE — Telephone Encounter (Signed)
Encouragingly, belching is a benign condition though I know it is frustrating to deal with.  He was supposed to start lansoprazole 30 mg daily and baclofen '5mg'$  twice daily with meals after his last visit.  As long as he is tolerating baclofen well without any side effects, we can try increasing baclofen to 10 mg twice daily with meals.  Side effects to monitor for include confusion, dizziness, lightheadedness, drowsiness, weakness, trembling.  Continue with other dietary/lifestyle recommendations given at last OV.   I would like to get him back in the office to see Dr. Gala Romney to see if he has any additional recommendations.   Manuela Schwartz: Please arrange follow-up with Dr. Gala Romney only.

## 2022-04-13 ENCOUNTER — Other Ambulatory Visit: Payer: Self-pay | Admitting: Gastroenterology

## 2022-04-13 DIAGNOSIS — R142 Eructation: Secondary | ICD-10-CM

## 2022-04-13 MED ORDER — BACLOFEN 10 MG PO TABS: 10.0000 mg | ORAL_TABLET | Freq: Two times a day (BID) | ORAL | 3 refills | Status: DC

## 2022-04-13 NOTE — Telephone Encounter (Signed)
Spoke to pt, informed him of recommendations. Pt voiced understanding. Pt informed me that he is taking lansoprazole '30mg'$  once daily and baclofen '5mg'$   twice daily with meals. He stated he would be willing to increase baclofen. Please send prescription to [place], [place].

## 2022-04-13 NOTE — Telephone Encounter (Signed)
New prescription sent

## 2022-04-13 NOTE — Telephone Encounter (Signed)
Noted  

## 2022-04-15 ENCOUNTER — Encounter: Payer: Self-pay | Admitting: Orthopaedic Surgery

## 2022-04-15 ENCOUNTER — Ambulatory Visit: Payer: PPO | Admitting: Orthopaedic Surgery

## 2022-04-15 VITALS — BP 151/83 | HR 71 | Ht 69.0 in | Wt 210.0 lb

## 2022-04-15 DIAGNOSIS — M25561 Pain in right knee: Secondary | ICD-10-CM | POA: Diagnosis not present

## 2022-04-15 DIAGNOSIS — M79671 Pain in right foot: Secondary | ICD-10-CM | POA: Diagnosis not present

## 2022-04-15 DIAGNOSIS — G8929 Other chronic pain: Secondary | ICD-10-CM

## 2022-04-15 MED ORDER — METHYLPREDNISOLONE ACETATE 40 MG/ML IJ SUSP
40.0000 mg | Freq: Once | INTRAMUSCULAR | Status: AC
  Administered 2022-04-15: 40 mg via INTRA_ARTICULAR

## 2022-04-15 NOTE — Addendum Note (Signed)
Addended by: Obie Dredge A on: 04/15/2022 04:42 PM   Modules accepted: Orders

## 2022-04-15 NOTE — Patient Instructions (Signed)
Get a metatarsal pad for your shoe. You can look for these at [place].  [email or web address]  to look for shoes that will have a wider toe box.

## 2022-04-15 NOTE — Progress Notes (Signed)
My knee hurts more  I had seen him last year in April with right knee pain and MRI showed bilateral meniscus tears.  He decided not to have surgery.  His right knee is bothering him more. He has swelling and popping and some giving way.  He would like to have an injection today.  He is still debating surgery.  He also has right foot pain.  He had surgery on the third toe for hammertoe years ago and the toe is starting to "rise up" and hits the top of his shoe.  He has pain of the anterior ankle also.  Right knee has effusion, crepitus, pain medially, positive Medial Mc[place], NV intact.  No distal edema.  Right foot has prior surgery third toe with arthrodesis of PIP and DIP joints.  Toes are tight for all with limited flexion.  NV intact.  There is no edema.  Encounter Diagnoses  Name Primary?   Chronic pain of right knee Yes   Chronic foot pain, right    PROCEDURE NOTE:  The patient requests injections of the right knee , verbal consent was obtained.  The right knee was prepped appropriately after time out was performed.   Sterile technique was observed and injection of 1 cc of DepoMedrol '40mg'$  with several cc's of plain xylocaine. Anesthesia was provided by ethyl chloride and a 20-gauge needle was used to inject the knee area. The injection was tolerated well.  A band aid dressing was applied.  The patient was advised to apply ice later today and tomorrow to the injection sight as needed.  I have told him about a metatarsal pad insert.  He has lost his metatarsal arch also.  I have told him about getting shoes with higher toe box.  I gave web address for him  Return in one month.  Think about knee surgery.  Call if any problem.  Precautions discussed.  Electronically Signed Sanjuana Kava, MD 8/30/202312:22 PM

## 2022-05-06 ENCOUNTER — Encounter: Payer: Self-pay | Admitting: *Deleted

## 2022-05-08 DIAGNOSIS — M79671 Pain in right foot: Secondary | ICD-10-CM | POA: Diagnosis not present

## 2022-05-13 NOTE — Telephone Encounter (Signed)
Pt already scheduled 10/10

## 2022-05-18 DIAGNOSIS — M6701 Short Achilles tendon (acquired), right ankle: Secondary | ICD-10-CM | POA: Diagnosis not present

## 2022-05-18 DIAGNOSIS — M7741 Metatarsalgia, right foot: Secondary | ICD-10-CM | POA: Diagnosis not present

## 2022-05-19 ENCOUNTER — Ambulatory Visit: Payer: PPO | Admitting: Orthopaedic Surgery

## 2022-05-20 ENCOUNTER — Telehealth: Payer: Self-pay | Admitting: Orthopaedic Surgery

## 2022-05-20 DIAGNOSIS — M7661 Achilles tendinitis, right leg: Secondary | ICD-10-CM | POA: Diagnosis not present

## 2022-05-22 DIAGNOSIS — M7661 Achilles tendinitis, right leg: Secondary | ICD-10-CM | POA: Diagnosis not present

## 2022-05-26 ENCOUNTER — Ambulatory Visit: Payer: PPO | Admitting: Internal Medicine

## 2022-05-26 DIAGNOSIS — M7661 Achilles tendinitis, right leg: Secondary | ICD-10-CM | POA: Diagnosis not present

## 2022-06-01 DIAGNOSIS — M25561 Pain in right knee: Secondary | ICD-10-CM | POA: Diagnosis not present

## 2022-06-02 ENCOUNTER — Ambulatory Visit: Payer: PPO | Admitting: Internal Medicine

## 2022-06-02 ENCOUNTER — Other Ambulatory Visit: Payer: Self-pay | Admitting: *Deleted

## 2022-06-02 ENCOUNTER — Encounter: Payer: Self-pay | Admitting: Internal Medicine

## 2022-06-02 VITALS — BP 151/96 | HR 73 | Temp 98.0°F | Ht 69.0 in | Wt 205.4 lb

## 2022-06-02 DIAGNOSIS — R142 Eructation: Secondary | ICD-10-CM

## 2022-06-02 DIAGNOSIS — M7661 Achilles tendinitis, right leg: Secondary | ICD-10-CM | POA: Diagnosis not present

## 2022-06-02 DIAGNOSIS — K5909 Other constipation: Secondary | ICD-10-CM | POA: Diagnosis not present

## 2022-06-02 NOTE — Patient Instructions (Signed)
It was good to see you again today!  As discussed, you may stop all of your acid blocker medications.  You may also stop baclofen  As a 2-week trial, stop Metamucil and Benefiber.  May use MiraLAX-1 capful in 8 ounces of water at bedtime or any other over-the-counter laxative without fiber for the next 2 weeks should you become constipated  Let me know in 2 weeks whether or not belching has changed  To further evaluate the cause of belching (air coming out of your mouth) we will proceed with a high-resolution manometry and ambulatory pH/impedance assessment OFF acid suppression therapy in the near future.  We will get this scheduled in [place] or at one of the other regional centers.  Further recommendations to follow.

## 2022-06-02 NOTE — Progress Notes (Signed)
Primary Care Physician:  [name], MD Primary Gastroenterologist:  Dr.   Pre-Procedure History & Physical: HPI:  [name] is a 71 y.o. male here for follow-up belching.  Patient belches all the time with meals without meals with drinking liquids and without.  Incessant day and night.  It is embarrassing.  Gallbladder long since removed many years ago.  He has been on various PPIs without effect.  Baclofen without any improvement. Constipation well managed with either Benefiber or Metamucil daily.  Fiber supplementation predates onset of belching by years.  No dysphagia.  Prior EGD demonstrated normal esophagus small hiatal hernia mild gastric erosions (no H. pylori).  Review of diet.  No more than 1 carbonated beverage open (soft drink or beer) weekly.  Never diarrhea.  No melena or rectal bleeding.  Very rare, infrequent typical reflux symptoms if he eats too much or eats spicy.  Nothing in diet to suggest issues with malabsorb carbohydrates. Past Medical History:  Diagnosis Date   Anemia    COUPLE OF YRS AGO- NO PROBLEMS SINCE   Complication of anesthesia    REMEMBERS FEELING REALLY COLD WAKING UP   GERD (gastroesophageal reflux disease)    RARE    Hyperlipidemia    Hypertension    Migraine    Pain    RIGHT SHOULDER - ROTATOR CUFF TEAR; denies further pain 08/23/2018.    Past Surgical History:  Procedure Laterality Date   BACK SURGERY  2006   LOWER BACK FUSION - SCREWS AND RODS   BACK SURGERY  06/2017   BIOPSY  12/24/2021   Procedure: BIOPSY;  Surgeon: Daneil Dolin, MD;  [facility];  Service: Endoscopy;;   BUNIONECTOMY WITH WEIL OSTEOTOMY Right 04/11/2020   Procedure: Right scarf and modified McBride bunionectomy, second and third metatarsal phalangeal weil osteotomy, third hammertoe correction with extensor tendon lengthening;  Surgeon: [name], MD;  Location: [facility];  Service: Orthopedics;  Laterality: Right;  [facility], choice   CATARACT EXTRACTION W/PHACO Right 11/26/2015   Procedure: CATARACT EXTRACTION PHACO AND INTRAOCULAR LENS PLACEMENT (IOC);  Surgeon: [name], MD;  Location: [facility];  Service: Ophthalmology;  Laterality: Right;  CDE:6.97   CATARACT EXTRACTION W/PHACO Left 12/10/2015   Procedure: CATARACT EXTRACTION PHACO AND INTRAOCULAR LENS PLACEMENT (IOC);  Surgeon: [name], MD;  Location: [facility];  Service: Ophthalmology;  Laterality: Left;  CDE:4.25   CHOLECYSTECTOMY     COLONOSCOPY WITH PROPOFOL N/A 12/02/2017   Procedure: COLONOSCOPY WITH PROPOFOL;  Surgeon: Daneil Dolin, MD;  [facility]: [facility];  Service: Endoscopy;  Laterality: N/A;  8:30am   ESOPHAGOGASTRODUODENOSCOPY (EGD) WITH PROPOFOL N/A 12/24/2021   Procedure: ESOPHAGOGASTRODUODENOSCOPY (EGD) WITH PROPOFOL;  Surgeon: Daneil Dolin, MD;  Location: [facility];  Service: Endoscopy;  Laterality: N/A;  3:00pm   ETHMOIDECTOMY Bilateral 08/15/2019   Procedure: ETHMOIDECTOMY;  Surgeon: [name], MD;  Location: [place];  Service: ENT;  Laterality: Bilateral;   FINGERNAIL REMOVED RT INDEX FINGER - FOR CYST THAT WAS BEHIND THE NAIL     FRONTAL SINUS EXPLORATION Bilateral 08/15/2019   Procedure: FRONTAL SINUS EXPLORATION;  Surgeon: [name], MD;  Location: [place];  Service: ENT;  Laterality: Bilateral;   left thumb surgery Left 06/2018   Dr. Berdine Dance SPINE SURGERY  08/11/2017   disc repair, Dr. [name]   MAXILLARY ANTROSTOMY Bilateral 08/15/2019   Procedure: MAXILLARY ANTROSTOMY WITH TISSUE REMOVAL;  Surgeon: [name], MD;  Location: [place];  Service: ENT;  Laterality: Bilateral;   SHOULDER OPEN ROTATOR CUFF REPAIR Right 09/01/2013   Procedure: RIGHT SHOULDER MINI OPEN ROTATOR CUFF REPAIR WITH SUBACROMIAL DECOMPRESSION;  Surgeon: [name], MD;  Location: [facility];  Service: Orthopedics;  Laterality: Right;   SINUS ENDO WITH FUSION Bilateral  08/15/2019   Procedure: SINUS ENDO WITH FUSION;  Surgeon: [name], MD;  Location: [facility];  Service: ENT;  Laterality: Bilateral;   SPHENOIDECTOMY Bilateral 08/15/2019   Procedure: SPHENOIDECTOMY;  Surgeon: [name], MD;  Location: [facility];  Service: ENT;  Laterality: Bilateral;   TONSILLECTOMY     SURGERY AS A CHILD   TURBINATE REDUCTION Bilateral 08/15/2019   Procedure: TURBINATE RECESSION;  Surgeon: [name], MD;  Location: [place];  Service: ENT;  Laterality: Bilateral;    Prior to Admission medications   Medication Sig Start Date End Date Taking? Authorizing Provider  acetaminophen (TYLENOL) 500 MG tablet Take 1,000 mg by mouth every 6 (six) hours as needed for moderate pain.   Yes [provider]  aspirin EC 81 MG tablet Take 81 mg by mouth daily. Swallow whole.   Yes [provider]  atorvastatin (LIPITOR) 20 MG tablet Take 20 mg by mouth daily with breakfast.    Yes [provider]  baclofen (LIORESAL) 10 MG tablet Take 1 tablet (10 mg total) by mouth 2 (two) times daily with a meal. 04/13/22 04/13/23 Yes Harper, Kristen S, PA-C  diclofenac Sodium (VOLTAREN) 1 % GEL APPLY 4 GRAMS TOPICALLY 4 TIMES DAILY 05/20/22  Yes Sanjuana Kava, MD  ibuprofen (ADVIL) 200 MG tablet Take 200 mg by mouth every 6 (six) hours as needed for moderate pain.   Yes [provider]  lansoprazole (PREVACID) 30 MG capsule Take 1 capsule (30 mg total) by mouth daily before breakfast. 03/16/22  Yes Aliene Altes S, PA-C  LORazepam (ATIVAN) 0.5 MG tablet Take 0.5 mg by mouth at bedtime as needed for sleep.   Yes [provider]  METAMUCIL FIBER PO Take 1 Scoop by mouth every other day. Alternates with benefiber   Yes [provider]  Multiple Vitamins-Minerals (CENTRUM SILVER PO) Take 1 tablet by mouth daily.   Yes [provider]  valsartan (DIOVAN) 320 MG tablet Take 320 mg by mouth daily.   Yes  [provider]  Wheat Dextrin (BENEFIBER DRINK MIX PO) Take 1 Scoop by mouth every other day.   Yes [provider]    Allergies as of 06/02/2022 - Review Complete 06/02/2022  Allergen Reaction Noted   Bee venom Itching 03/06/2016   Codeine Itching 04/13/2013    Family History  Problem Relation Age of Onset   Hypertension Mother    Kidney failure Mother    Diabetes Mother    High blood pressure Father    High blood pressure Sister    Cancer Brother        brain tumor   High blood pressure Brother    Other Other    High blood pressure Brother    Migraines Daughter    Migraines Grandson    Migraines Granddaughter    Migraines Granddaughter    Colon cancer Neg Hx     Social History   Socioeconomic History   Marital status: Married    Spouse name: Not on file   Number of children: 3   Years of education: 13   Highest  education level: High school graduate  Occupational History   Not on file  Tobacco Use   Smoking status: Never   Smokeless tobacco: Former    Types: Snuff    Quit date: 10/2015  Vaping Use   Vaping Use: Never used  Substance and Sexual Activity   Alcohol use: Yes    Comment: 1 beer a week or less   Drug use: No   Sexual activity: Yes    Birth control/protection: None  Other Topics Concern   Not on file  Social History Narrative   Lives at home with his wife Jullien Granquist   Right handed   Caffeine: 2 cups daily, coffee. Maybe 1 soda a day.   Social Determinants of Health   Financial Resource Strain: Not on file  Food Insecurity: Not on file  Transportation Needs: Not on file  Physical Activity: Not on file  Stress: Not on file  Social Connections: Not on file  Intimate Partner Violence: Not on file    Review of Systems: See HPI, otherwise negative ROS  Physical Exam: BP (!) 151/96 (BP Location: Left Arm, Patient Position: Sitting, Cuff Size: Large)   Pulse 73   Temp 98 F (36.7 C) (Oral)   Ht '5\' 9"'$  (1.753 m)   Wt  205 lb 6.4 oz (93.2 kg)   SpO2 99%   BMI 30.33 kg/m  General:   Alert,   well-nourished, pleasant and cooperative in NAD Neck:  Supple; no masses or thyromegaly. No significant cervical adenopathy. Lungs:  Clear throughout to auscultation.   No wheezes, crackles, or rhonchi. No acute distress. Heart:  Regular rate and rhythm; no murmurs, clicks, rubs,  or gallops. Abdomen: Non-distended, normal bowel sounds.  Soft and nontender without appreciable mass or hepatosplenomegaly.  Pulses:  Normal pulses noted. Extremities:  Without clubbing or edema.  Impression/Plan: 71 year old gentleman here for further evaluation of recalcitrant belching.  Extremely infrequent typical symptoms (heartburn/indigestion).  No dysphagia.  No vomiting.  Patient has been on multiple various PPI regimens and more recently baclofen without any meaningful improvement.  He belches all the time-incessantly. Moreover, no improvement with peppermint oil. At this point, we need to further delineate the etiology of eructation  i.e. gastric versus supra gastric /alkaline reflux.  Does not sound like rumination syndrome.  Although, regular/daily fiber supplement use predates belching by a prolonged period of time, he ought to take a holiday from his fiber supplement just to see if that makes a difference  Recommendations:  For now, stop all acid blocker medications.  Stop baclofen.  2-week holiday off all fiber supplements; use MiraLAX or other OTC laxatives in the interim  Let me know in 2 weeks if this is had any effect on belching  Ultimately, we are going to refer him for a high-resolution manometry along with impedance/pH testing OFF all acid suppression therapy.  We will schedule this in either [place] or the closest regional center.  Further recommendations to follow.       Notice: This dictation was prepared with Dragon dictation along with smaller phrase technology. Any transcriptional errors that  result from this process are unintentional and may not be corrected upon review.

## 2022-06-05 DIAGNOSIS — M7661 Achilles tendinitis, right leg: Secondary | ICD-10-CM | POA: Diagnosis not present

## 2022-06-09 DIAGNOSIS — M7661 Achilles tendinitis, right leg: Secondary | ICD-10-CM | POA: Diagnosis not present

## 2022-06-17 ENCOUNTER — Telehealth: Payer: Self-pay

## 2022-06-17 ENCOUNTER — Other Ambulatory Visit: Payer: Self-pay

## 2022-06-17 DIAGNOSIS — K219 Gastro-esophageal reflux disease without esophagitis: Secondary | ICD-10-CM

## 2022-06-17 NOTE — Telephone Encounter (Signed)
Called the patient to discuss the referral from his primary GI Dr Sydell Axon at [facility].  Patient did not answer the phone. Left a message of the appointment date of 11/04/22 at 12:30. Also left message to expect information to arrive through the mail. Patient does not have My Chart set up.

## 2022-06-22 ENCOUNTER — Telehealth: Payer: Self-pay

## 2022-06-22 NOTE — Telephone Encounter (Signed)
Pt called stating that the manometry won't be able to be done until March per [facility]. Pt was instructed to let our office know in case we wanted to send him elsewhere.

## 2022-06-24 DIAGNOSIS — M7661 Achilles tendinitis, right leg: Secondary | ICD-10-CM | POA: Diagnosis not present

## 2022-06-29 DIAGNOSIS — M7741 Metatarsalgia, right foot: Secondary | ICD-10-CM | POA: Diagnosis not present

## 2022-07-22 DIAGNOSIS — M7741 Metatarsalgia, right foot: Secondary | ICD-10-CM | POA: Diagnosis not present

## 2022-07-27 DIAGNOSIS — M79671 Pain in right foot: Secondary | ICD-10-CM | POA: Diagnosis not present

## 2022-07-27 DIAGNOSIS — M79672 Pain in left foot: Secondary | ICD-10-CM | POA: Diagnosis not present

## 2022-07-29 ENCOUNTER — Other Ambulatory Visit (HOSPITAL_COMMUNITY): Payer: Self-pay | Admitting: Orthopaedic Surgery

## 2022-07-29 DIAGNOSIS — M79671 Pain in right foot: Secondary | ICD-10-CM

## 2022-07-30 ENCOUNTER — Ambulatory Visit: Payer: Self-pay

## 2022-07-30 DIAGNOSIS — J09X9 Influenza due to identified novel influenza A virus with other manifestations: Secondary | ICD-10-CM | POA: Diagnosis not present

## 2022-07-30 DIAGNOSIS — Z6831 Body mass index (BMI) 31.0-31.9, adult: Secondary | ICD-10-CM | POA: Diagnosis not present

## 2022-07-30 DIAGNOSIS — E669 Obesity, unspecified: Secondary | ICD-10-CM | POA: Diagnosis not present

## 2022-08-03 DIAGNOSIS — E669 Obesity, unspecified: Secondary | ICD-10-CM | POA: Diagnosis not present

## 2022-08-03 DIAGNOSIS — J019 Acute sinusitis, unspecified: Secondary | ICD-10-CM | POA: Diagnosis not present

## 2022-08-03 DIAGNOSIS — Z6830 Body mass index (BMI) 30.0-30.9, adult: Secondary | ICD-10-CM | POA: Diagnosis not present

## 2022-08-07 DIAGNOSIS — J019 Acute sinusitis, unspecified: Secondary | ICD-10-CM | POA: Diagnosis not present

## 2022-08-07 DIAGNOSIS — G43009 Migraine without aura, not intractable, without status migrainosus: Secondary | ICD-10-CM | POA: Diagnosis not present

## 2022-08-21 DIAGNOSIS — R7301 Impaired fasting glucose: Secondary | ICD-10-CM | POA: Diagnosis not present

## 2022-08-21 DIAGNOSIS — J019 Acute sinusitis, unspecified: Secondary | ICD-10-CM | POA: Diagnosis not present

## 2022-08-21 DIAGNOSIS — E782 Mixed hyperlipidemia: Secondary | ICD-10-CM | POA: Diagnosis not present

## 2022-08-24 ENCOUNTER — Ambulatory Visit (HOSPITAL_COMMUNITY)
Admission: RE | Admit: 2022-08-24 | Discharge: 2022-08-24 | Disposition: A | Discharge status: Home / Self Care | Payer: Medicare HMO | Source: Ambulatory Visit | Attending: Orthopaedic Surgery | Admitting: Orthopaedic Surgery

## 2022-08-24 DIAGNOSIS — M79671 Pain in right foot: Secondary | ICD-10-CM | POA: Diagnosis not present

## 2022-08-27 DIAGNOSIS — Z0000 Encounter for general adult medical examination without abnormal findings: Secondary | ICD-10-CM | POA: Diagnosis not present

## 2022-08-27 DIAGNOSIS — K219 Gastro-esophageal reflux disease without esophagitis: Secondary | ICD-10-CM | POA: Diagnosis not present

## 2022-08-27 DIAGNOSIS — M7041 Prepatellar bursitis, right knee: Secondary | ICD-10-CM | POA: Diagnosis not present

## 2022-08-27 DIAGNOSIS — M545 Low back pain, unspecified: Secondary | ICD-10-CM | POA: Diagnosis not present

## 2022-08-27 DIAGNOSIS — N182 Chronic kidney disease, stage 2 (mild): Secondary | ICD-10-CM | POA: Diagnosis not present

## 2022-08-27 DIAGNOSIS — R7303 Prediabetes: Secondary | ICD-10-CM | POA: Diagnosis not present

## 2022-08-27 DIAGNOSIS — I129 Hypertensive chronic kidney disease with stage 1 through stage 4 chronic kidney disease, or unspecified chronic kidney disease: Secondary | ICD-10-CM | POA: Diagnosis not present

## 2022-08-27 DIAGNOSIS — G4700 Insomnia, unspecified: Secondary | ICD-10-CM | POA: Diagnosis not present

## 2022-08-27 DIAGNOSIS — I10 Essential (primary) hypertension: Secondary | ICD-10-CM | POA: Diagnosis not present

## 2022-08-27 DIAGNOSIS — G8929 Other chronic pain: Secondary | ICD-10-CM | POA: Diagnosis not present

## 2022-08-27 DIAGNOSIS — K5909 Other constipation: Secondary | ICD-10-CM | POA: Diagnosis not present

## 2022-08-27 DIAGNOSIS — E782 Mixed hyperlipidemia: Secondary | ICD-10-CM | POA: Diagnosis not present

## 2022-08-28 DIAGNOSIS — M25572 Pain in left ankle and joints of left foot: Secondary | ICD-10-CM | POA: Diagnosis not present

## 2022-09-09 DIAGNOSIS — Z0100 Encounter for examination of eyes and vision without abnormal findings: Secondary | ICD-10-CM | POA: Diagnosis not present

## 2022-09-09 DIAGNOSIS — X32XXXD Exposure to sunlight, subsequent encounter: Secondary | ICD-10-CM | POA: Diagnosis not present

## 2022-09-09 DIAGNOSIS — H52223 Regular astigmatism, bilateral: Secondary | ICD-10-CM | POA: Diagnosis not present

## 2022-09-09 DIAGNOSIS — L570 Actinic keratosis: Secondary | ICD-10-CM | POA: Diagnosis not present

## 2022-09-16 DIAGNOSIS — M79674 Pain in right toe(s): Secondary | ICD-10-CM | POA: Diagnosis not present

## 2022-09-16 DIAGNOSIS — R69 Illness, unspecified: Secondary | ICD-10-CM | POA: Diagnosis not present
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
## 2022-09-30 DIAGNOSIS — R69 Illness, unspecified: Secondary | ICD-10-CM | POA: Diagnosis not present

## 2022-10-06 DIAGNOSIS — M205X1 Other deformities of toe(s) (acquired), right foot: Secondary | ICD-10-CM | POA: Diagnosis not present

## 2022-10-15 ENCOUNTER — Other Ambulatory Visit: Payer: Self-pay | Admitting: Internal Medicine

## 2022-10-21 DIAGNOSIS — L570 Actinic keratosis: Secondary | ICD-10-CM | POA: Diagnosis not present

## 2022-10-21 DIAGNOSIS — X32XXXD Exposure to sunlight, subsequent encounter: Secondary | ICD-10-CM | POA: Diagnosis not present

## 2022-10-21 DIAGNOSIS — M79674 Pain in right toe(s): Secondary | ICD-10-CM | POA: Diagnosis not present

## 2022-10-27 ENCOUNTER — Encounter: Payer: Self-pay | Admitting: *Deleted

## 2022-11-04 ENCOUNTER — Encounter (HOSPITAL_COMMUNITY)
Admission: RE | Disposition: A | Discharge status: Home / Self Care | Payer: Self-pay | Source: Ambulatory Visit | Attending: Gastroenterology

## 2022-11-04 ENCOUNTER — Ambulatory Visit (HOSPITAL_COMMUNITY)
Admission: RE | Admit: 2022-11-04 | Discharge: 2022-11-04 | Disposition: A | Discharge status: Home / Self Care | Payer: Medicare HMO | Source: Ambulatory Visit | Attending: Gastroenterology | Admitting: Gastroenterology

## 2022-11-04 DIAGNOSIS — K2289 Other specified disease of esophagus: Secondary | ICD-10-CM | POA: Insufficient documentation

## 2022-11-04 DIAGNOSIS — R142 Eructation: Secondary | ICD-10-CM | POA: Insufficient documentation

## 2022-11-04 DIAGNOSIS — R12 Heartburn: Secondary | ICD-10-CM | POA: Diagnosis not present

## 2022-11-04 DIAGNOSIS — K219 Gastro-esophageal reflux disease without esophagitis: Secondary | ICD-10-CM

## 2022-11-04 HISTORY — PX: PH IMPEDANCE STUDY: SHX5565

## 2022-11-04 HISTORY — PX: ESOPHAGEAL MANOMETRY: SHX5429

## 2022-11-04 SURGERY — MANOMETRY, ESOPHAGUS
Anesthesia: Choice

## 2022-11-04 MED ORDER — LIDOCAINE VISCOUS HCL 2 % MT SOLN
OROMUCOSAL | Status: AC
  Filled 2022-11-04: qty 15

## 2022-11-04 SURGICAL SUPPLY — 2 items
FACESHIELD LNG OPTICON STERILE (SAFETY) IMPLANT
GLOVE BIO SURGEON STRL SZ8 (GLOVE) ×2 IMPLANT

## 2022-11-04 NOTE — Progress Notes (Signed)
Esophageal Manometry done per protocol. Pt tolerated well with out complication. Ph with impedance done per protocol. Pt tolerated well. Instructions given regarding the study and monitor. Pt verbalized understand and return demonstrated use of monitor. Pt will return tomorrow to have probe removed and monitor downloaded.  

## 2022-11-05 ENCOUNTER — Telehealth (INDEPENDENT_AMBULATORY_CARE_PROVIDER_SITE_OTHER): Payer: Self-pay | Admitting: *Deleted

## 2022-11-05 ENCOUNTER — Encounter (HOSPITAL_COMMUNITY): Payer: Self-pay | Admitting: Gastroenterology

## 2022-11-05 NOTE — Telephone Encounter (Signed)
  Procedure: colonoscopy Estimated body mass index is 30.34 kg/m (pended) as calculated from the following:   Height as of 11/04/22: (P) 5\' 9"  (1.753 m).   Weight as of 11/04/22: (P) 205 lb 7.5 oz (93.2 kg).   Have you had a colonoscopy before?  11/2017, Dr. Gala Romney  Do you have family history of colon cancer?  no  Do you have a family history of polyps? no  Previous colonoscopy with polyps removed? yes  Do you have a history colorectal cancer?   no  Are you diabetic?  no  Do you have a prosthetic or mechanical heart valve? no  Do you have a pacemaker/defibrillator?   no  Have you had endocarditis/atrial fibrillation?  no  Do you use supplemental oxygen/CPAP?  no  Have you had joint replacement within the last 12 months?  no  Do you tend to be constipated or have to use laxatives?  no   Do you have history of alcohol use? If yes, how much and how often.  no  Do you have history or are you using drugs? If yes, what do are you  using?  no  Have you ever had a stroke/heart attack?  no  Have you ever had a heart or other vascular stent placed,?no  Do you take weight loss medication? no  Do you take any blood-thinning medications such as: (Plavix, aspirin, Coumadin, Aggrenox, Brilinta, Xarelto, Eliquis, Pradaxa, Savaysa or Effient)? Aspirin 81mg   If yes we need the name, milligram, dosage and who is prescribing doctor:               Current Outpatient Medications  Medication Sig Dispense Refill   acetaminophen (TYLENOL) 500 MG tablet Take 1,000 mg by mouth every 6 (six) hours as needed for moderate pain.     aspirin EC 81 MG tablet Take 81 mg by mouth daily. Swallow whole.     atorvastatin (LIPITOR) 20 MG tablet Take 20 mg by mouth daily with breakfast.      baclofen (LIORESAL) 10 MG tablet Take 1 tablet (10 mg total) by mouth 2 (two) times daily with a meal. 60 tablet 3   diclofenac Sodium (VOLTAREN) 1 % GEL APPLY 4 GRAMS TOPICALLY 4 TIMES DAILY 500 g 0   ibuprofen (ADVIL)  200 MG tablet Take 200 mg by mouth every 6 (six) hours as needed for moderate pain.     lansoprazole (PREVACID) 30 MG capsule Take 1 capsule (30 mg total) by mouth daily before breakfast. 30 capsule 3   LORazepam (ATIVAN) 0.5 MG tablet Take 0.5 mg by mouth at bedtime as needed for sleep.     METAMUCIL FIBER PO Take 1 Scoop by mouth every other day. Alternates with benefiber     Multiple Vitamins-Minerals (CENTRUM SILVER PO) Take 1 tablet by mouth daily.     valsartan (DIOVAN) 320 MG tablet Take 320 mg by mouth daily.     Wheat Dextrin (BENEFIBER DRINK MIX PO) Take 1 Scoop by mouth every other day.     No current facility-administered medications for this visit.    Allergies  Allergen Reactions   Bee Venom Itching   Codeine Itching

## 2022-11-12 DIAGNOSIS — R12 Heartburn: Secondary | ICD-10-CM

## 2022-11-18 DIAGNOSIS — M79674 Pain in right toe(s): Secondary | ICD-10-CM | POA: Diagnosis not present

## 2022-11-19 ENCOUNTER — Telehealth: Payer: Self-pay

## 2022-11-19 ENCOUNTER — Encounter: Payer: Self-pay | Admitting: *Deleted

## 2022-11-19 NOTE — Telephone Encounter (Signed)
error 

## 2022-11-21 ENCOUNTER — Telehealth: Payer: Self-pay | Admitting: Internal Medicine

## 2022-11-21 NOTE — Telephone Encounter (Signed)
Update on evaluation.  Further evaluation recommended over at [facility].  Patient not interested in going in that direction.  I asked Dr. Marletta Lor to review this chart to see if he had any other thoughts on management of incessant belching.  Dr. Marletta Lor was kind enough to thoughtfully review the chart yesterday; he felt patient has had a thorough evaluation and had no further recommendations.  At this point, we know patient has GERD as evidenced by recent pH study off acid suppression therapy.  He also has very meager esophageal body motility.  It is possible he is air trapping when he is swallowing.  However, he had no evidence of rumination/regurgitation.  Previously he was was briefly on Dexilant 60 mg daily and felt this did help his belching to some degree.  He could not afford it.  Currently on Protonix.  It is possible patient may have some gas/bloat even known diagnosis of SIBO as contributing to his symptoms.  Previous interview failed to reveal any unusual dietary habits.  At this point, I feel it is worthwhile trying a FODMAP diet for 4 to 6 weeks.  Stopping Protonix beginning esomeprazole 40 mg twice daily for the same period.  Office follow-up in 4 to 6 weeks.  At that point if no improvement would consider empiric course of Xifaxan versus breath testing.  I called patient today.  Lengthy conversation about workup thus far and options for treatment moving forward.  He notes he took prior to Nexium previously and was switched generics it did not and did not control his heartburn nearly as well.  He states he changed insurance recently.  First of the week, we will determine what PPIs are options for good coverage with his insurance carrier.  We will move to twice daily therapy for 6 weeks also we will send him a FODMAP diet the first of the week.  Discussed reasoning behind the this approach.  His questions were answered.  He is agreeable.

## 2022-11-23 ENCOUNTER — Telehealth: Payer: Self-pay

## 2022-11-23 ENCOUNTER — Other Ambulatory Visit: Payer: Self-pay

## 2022-11-23 MED ORDER — RABEPRAZOLE SODIUM 20 MG PO TBEC: 20.0000 mg | DELAYED_RELEASE_TABLET | Freq: Every day | ORAL | 11 refills | Status: DC

## 2022-11-23 NOTE — Telephone Encounter (Signed)
-----   Message from Corbin Ade, MD sent at 11/21/2022 11:41 AM EDT ----- See telephone note.  Extensive conversation with Marletta Lor and patient over the weekend.  Marletta Lor has nothing else to add.  We can drop back and try a FODMAP diet need to put the info in the mail regarding this first thing Monday.  Also, I want him to try another PPI twice daily.  Before we send a prescription, can we check with his insurance carrier and find out what they would pay for I am thinking about rabeprazole 20 mg twice daily -  he tried generic Nexium before it did not work.  Dexilant worked pretty well but he could not afford it.  He has changed insurance companies medication benefit may have changed to you. Will talk to you on Monday.  Thanks.

## 2022-11-23 NOTE — Telephone Encounter (Signed)
Rabeprazole is on his formulary, however there is a quantity limit of only 30 a month. We can still try to get it approved through a PA.

## 2022-11-23 NOTE — Telephone Encounter (Signed)
Rx was sent to pharmacy and pt was made aware.

## 2022-11-30 NOTE — Telephone Encounter (Signed)
LMOVM to call back 

## 2022-11-30 NOTE — Telephone Encounter (Signed)
2019 colonoscopy: non-bleeding external and internal hemorrhoids. Diverticulosis. One 7 mm polyp in descending colon. Tubular adenoma. Due for 5 year surveillance.   Appropriate. ASA 2.

## 2022-12-03 ENCOUNTER — Encounter: Payer: Self-pay | Admitting: *Deleted

## 2022-12-03 MED ORDER — PEG 3350-KCL-NA BICARB-NACL 420 G PO SOLR
4000.0000 mL | Freq: Once | ORAL | 0 refills | Status: AC
Start: 2022-12-03 — End: 2022-12-03

## 2022-12-03 NOTE — Telephone Encounter (Signed)
Spoke with pt. Scheduled for 5/15. Aware will send instructions. Rx for prep to pharmacy. Aware will get pre-op phone call.

## 2022-12-03 NOTE — Addendum Note (Signed)
Addended by: Armstead Peaks on: 12/03/2022 09:15 AM   Modules accepted: Orders

## 2022-12-08 ENCOUNTER — Telehealth: Payer: Self-pay | Admitting: *Deleted

## 2022-12-08 NOTE — Telephone Encounter (Signed)
Pt called and left vm about upcoming procedure.  LMTRC

## 2022-12-09 NOTE — Telephone Encounter (Signed)
Questionnaire from recall, no referral needed  

## 2022-12-28 ENCOUNTER — Encounter (HOSPITAL_COMMUNITY): Payer: Self-pay

## 2022-12-28 ENCOUNTER — Other Ambulatory Visit: Payer: Self-pay

## 2022-12-28 ENCOUNTER — Encounter (HOSPITAL_COMMUNITY)
Admission: RE | Admit: 2022-12-28 | Discharge: 2022-12-28 | Disposition: A | Discharge status: Home / Self Care | Payer: Medicare HMO | Source: Ambulatory Visit | Attending: Internal Medicine | Admitting: Internal Medicine

## 2022-12-30 ENCOUNTER — Ambulatory Visit (HOSPITAL_BASED_OUTPATIENT_CLINIC_OR_DEPARTMENT_OTHER): Payer: Medicare HMO | Admitting: Anesthesiology

## 2022-12-30 ENCOUNTER — Ambulatory Visit (HOSPITAL_COMMUNITY)
Admission: RE | Admit: 2022-12-30 | Discharge: 2022-12-30 | Disposition: A | Discharge status: Home / Self Care | Payer: Medicare HMO | Attending: Internal Medicine | Admitting: Internal Medicine

## 2022-12-30 ENCOUNTER — Ambulatory Visit (HOSPITAL_COMMUNITY): Payer: Medicare HMO | Admitting: Anesthesiology

## 2022-12-30 ENCOUNTER — Encounter (HOSPITAL_COMMUNITY)
Admission: RE | Disposition: A | Discharge status: Home / Self Care | Payer: Self-pay | Source: Home / Self Care | Attending: Internal Medicine

## 2022-12-30 ENCOUNTER — Encounter (HOSPITAL_COMMUNITY): Payer: Self-pay | Admitting: Internal Medicine

## 2022-12-30 DIAGNOSIS — R519 Headache, unspecified: Secondary | ICD-10-CM | POA: Insufficient documentation

## 2022-12-30 DIAGNOSIS — Z09 Encounter for follow-up examination after completed treatment for conditions other than malignant neoplasm: Secondary | ICD-10-CM | POA: Diagnosis not present

## 2022-12-30 DIAGNOSIS — K5730 Diverticulosis of large intestine without perforation or abscess without bleeding: Secondary | ICD-10-CM | POA: Insufficient documentation

## 2022-12-30 DIAGNOSIS — D649 Anemia, unspecified: Secondary | ICD-10-CM

## 2022-12-30 DIAGNOSIS — I10 Essential (primary) hypertension: Secondary | ICD-10-CM | POA: Insufficient documentation

## 2022-12-30 DIAGNOSIS — D126 Benign neoplasm of colon, unspecified: Secondary | ICD-10-CM

## 2022-12-30 DIAGNOSIS — Z86010 Personal history of colonic polyps: Secondary | ICD-10-CM | POA: Diagnosis not present

## 2022-12-30 DIAGNOSIS — Z1211 Encounter for screening for malignant neoplasm of colon: Secondary | ICD-10-CM | POA: Diagnosis not present

## 2022-12-30 DIAGNOSIS — K219 Gastro-esophageal reflux disease without esophagitis: Secondary | ICD-10-CM | POA: Diagnosis not present

## 2022-12-30 HISTORY — PX: COLONOSCOPY WITH PROPOFOL: SHX5780

## 2022-12-30 SURGERY — COLONOSCOPY WITH PROPOFOL
Anesthesia: General

## 2022-12-30 MED ORDER — PROPOFOL 500 MG/50ML IV EMUL
INTRAVENOUS | Status: AC
  Filled 2022-12-30: qty 50

## 2022-12-30 MED ORDER — PROPOFOL 10 MG/ML IV BOLUS
INTRAVENOUS | Status: DC | PRN
  Administered 2022-12-30: 60 mg via INTRAVENOUS

## 2022-12-30 MED ORDER — STERILE WATER FOR IRRIGATION IR SOLN
Status: DC | PRN
  Administered 2022-12-30: 180 mL

## 2022-12-30 MED ORDER — EPHEDRINE SULFATE-NACL 50-0.9 MG/10ML-% IV SOSY
PREFILLED_SYRINGE | INTRAVENOUS | Status: DC | PRN
  Administered 2022-12-30 (×3): 5 mg via INTRAVENOUS

## 2022-12-30 MED ORDER — LACTATED RINGERS IV SOLN: INTRAVENOUS | Status: DC

## 2022-12-30 MED ORDER — PROPOFOL 500 MG/50ML IV EMUL
INTRAVENOUS | Status: DC | PRN
  Administered 2022-12-30: 150 ug/kg/min via INTRAVENOUS

## 2022-12-30 NOTE — Anesthesia Preprocedure Evaluation (Signed)
Anesthesia Evaluation  Patient identified by MRN, date of birth, ID band Patient awake    Reviewed: Allergy & Precautions, H&P , NPO status , Patient's Chart, lab work & pertinent test results  History of Anesthesia Complications (+) history of anesthetic complications  Airway Mallampati: II  TM Distance: >3 FB Neck ROM: Full    Dental  (+) Dental Advisory Given, Caps   Pulmonary neg pulmonary ROS   Pulmonary exam normal breath sounds clear to auscultation       Cardiovascular hypertension, Pt. on medications Normal cardiovascular exam Rhythm:Regular Rate:Normal     Neuro/Psych  Headaches  negative psych ROS   GI/Hepatic Neg liver ROS,GERD  ,,  Endo/Other  negative endocrine ROS    Renal/GU negative Renal ROS  negative genitourinary   Musculoskeletal negative musculoskeletal ROS (+)    Abdominal   Peds negative pediatric ROS (+)  Hematology  (+) Blood dyscrasia, anemia   Anesthesia Other Findings   Reproductive/Obstetrics negative OB ROS                             Anesthesia Physical Anesthesia Plan  ASA: 2  Anesthesia Plan: General   Post-op Pain Management: Minimal or no pain anticipated   Induction: Intravenous  PONV Risk Score and Plan: Treatment may vary due to age or medical condition  Airway Management Planned: Nasal Cannula and Natural Airway  Additional Equipment:   Intra-op Plan:   Post-operative Plan:   Informed Consent: I have reviewed the patients History and Physical, chart, labs and discussed the procedure including the risks, benefits and alternatives for the proposed anesthesia with the patient or authorized representative who has indicated his/her understanding and acceptance.     Dental advisory given  Plan Discussed with: CRNA and Surgeon  Anesthesia Plan Comments:        Anesthesia Quick Evaluation

## 2022-12-30 NOTE — Anesthesia Postprocedure Evaluation (Signed)
Anesthesia Post Note  Patient: [name]  Procedure(s) Performed: COLONOSCOPY WITH PROPOFOL  Patient location during evaluation: Phase II Anesthesia Type: General Level of consciousness: awake and alert and oriented Pain management: pain level controlled Vital Signs Assessment: post-procedure vital signs reviewed and stable Respiratory status: spontaneous breathing, nonlabored ventilation and respiratory function stable Cardiovascular status: blood pressure returned to baseline and stable Postop Assessment: no apparent nausea or vomiting Anesthetic complications: no  No notable events documented.   Last Vitals:  Vitals:   12/30/22 0943 12/30/22 0944  BP:  110/69  Pulse: 83   Resp: 14   Temp: (!) 36 C   SpO2: 98%     Last Pain:  Vitals:   12/30/22 0943  TempSrc: Oral  PainSc: 0-No pain                 Jonanthan Bolender C Denaja Verhoeven

## 2022-12-30 NOTE — Discharge Instructions (Signed)
  Colonoscopy Discharge Instructions  Read the instructions outlined below and refer to this sheet in the next few weeks. These discharge instructions provide you with general information on caring for yourself after you leave the hospital. Your doctor may also give you specific instructions. While your treatment has been planned according to the most current medical practices available, unavoidable complications occasionally occur. If you have any problems or questions after discharge, call Dr. Jena Gauss at [phone]. ACTIVITY You may resume your regular activity, but move at a slower pace for the next 24 hours.  Take frequent rest periods for the next 24 hours.  Walking will help get rid of the air and reduce the bloated feeling in your belly (abdomen).  No driving for 24 hours (because of the medicine (anesthesia) used during the test).   Do not sign any important legal documents or operate any machinery for 24 hours (because of the anesthesia used during the test).  NUTRITION Drink plenty of fluids.  You may resume your normal diet as instructed by your doctor.  Begin with a light meal and progress to your normal diet. Heavy or fried foods are harder to digest and may make you feel sick to your stomach (nauseated).  Avoid alcoholic beverages for 24 hours or as instructed.  MEDICATIONS You may resume your normal medications unless your doctor tells you otherwise.  WHAT YOU CAN EXPECT TODAY Some feelings of bloating in the abdomen.  Passage of more gas than usual.  Spotting of blood in your stool or on the toilet paper.  IF YOU HAD POLYPS REMOVED DURING THE COLONOSCOPY: No aspirin products for 7 days or as instructed.  No alcohol for 7 days or as instructed.  Eat a soft diet for the next 24 hours.  FINDING OUT THE RESULTS OF YOUR TEST Not all test results are available during your visit. If your test results are not back during the visit, make an appointment with your caregiver to find out the  results. Do not assume everything is normal if you have not heard from your caregiver or the medical facility. It is important for you to follow up on all of your test results.  SEEK IMMEDIATE MEDICAL ATTENTION IF: You have more than a spotting of blood in your stool.  Your belly is swollen (abdominal distention).  You are nauseated or vomiting.  You have a temperature over 101.  You have abdominal pain or discomfort that is severe or gets worse throughout the day.      No polyps found today!  You do have diverticulosis as seen previously   diverticulosis information provided  It is recommended you return for 1 more  colonoscopy in 7 years if your overall health permits  Office visit with [place] in 3 months   at patient request, I called [employer] at [phone] findings and recommendations

## 2022-12-30 NOTE — H&P (Signed)
@LOGO @   Primary Care Physician:  Benita Stabile, MD Primary Gastroenterologist:  Dr.   Pre-Procedure History & Physical: HPI:  [name] is a 72 y.o. male here for   Past Medical History:  Diagnosis Date   Anemia    COUPLE OF YRS AGO- NO PROBLEMS SINCE   Complication of anesthesia    REMEMBERS FEELING REALLY COLD WAKING UP   GERD (gastroesophageal reflux disease)    RARE    Hyperlipidemia    Hypertension    Migraine    Pain    RIGHT SHOULDER - ROTATOR CUFF TEAR; denies further pain 08/23/2018.    Past Surgical History:  Procedure Laterality Date   BACK SURGERY  2006   LOWER BACK FUSION - SCREWS AND RODS   BACK SURGERY  06/2017   BIOPSY  12/24/2021   Procedure: BIOPSY;  Surgeon: Corbin Ade, MD;  [facility];  Service: Endoscopy;;   BUNIONECTOMY WITH WEIL OSTEOTOMY Right 04/11/2020   Procedure: Right scarf and modified McBride bunionectomy, second and third metatarsal phalangeal weil osteotomy, third hammertoe correction with extensor tendon lengthening;  Surgeon: Toni Arthurs, MD;  Location: [facility];  Service: Orthopedics;  Laterality: Right;  [facility], choice   CATARACT EXTRACTION W/PHACO Right 11/26/2015   Procedure: CATARACT EXTRACTION PHACO AND INTRAOCULAR LENS PLACEMENT (IOC);  Surgeon: Jethro Bolus, MD;  Location: [facility];  Service: Ophthalmology;  Laterality: Right;  CDE:6.97   CATARACT EXTRACTION W/PHACO Left 12/10/2015   Procedure: CATARACT EXTRACTION PHACO AND INTRAOCULAR LENS PLACEMENT (IOC);  Surgeon: Jethro Bolus, MD;  Location: [facility];  Service: Ophthalmology;  Laterality: Left;  CDE:4.25   CHOLECYSTECTOMY     COLONOSCOPY WITH PROPOFOL N/A 12/02/2017   Procedure: COLONOSCOPY WITH PROPOFOL;  Surgeon: Corbin Ade, MD;  [facility];  Service: Endoscopy;  Laterality: N/A;  8:30am   ESOPHAGEAL MANOMETRY N/A 11/04/2022   Procedure: ESOPHAGEAL MANOMETRY (EM);  Surgeon: Napoleon Form, MD;  [facility];  Service: Gastroenterology;  Laterality: N/A;   ESOPHAGOGASTRODUODENOSCOPY (EGD) WITH PROPOFOL N/A 12/24/2021   Procedure: ESOPHAGOGASTRODUODENOSCOPY (EGD) WITH PROPOFOL;  Surgeon: Corbin Ade, MD;  [facility];  Service: Endoscopy;  Laterality: N/A;  3:00pm   ETHMOIDECTOMY Bilateral 08/15/2019   Procedure: ETHMOIDECTOMY;  Surgeon: Newman Pies, MD;  Location: [facility];  Service: ENT;  Laterality: Bilateral;   FINGERNAIL REMOVED RT INDEX FINGER - FOR CYST THAT WAS BEHIND THE NAIL     FRONTAL SINUS EXPLORATION Bilateral 08/15/2019   Procedure: FRONTAL SINUS EXPLORATION;  Surgeon: Newman Pies, MD;  Location: [facility];  Service: ENT;  Laterality: Bilateral;   left thumb surgery Left 06/2018   Dr. Hartford Poli SPINE SURGERY  08/11/2017   disc repair, Dr. Ned Card   MAXILLARY ANTROSTOMY Bilateral 08/15/2019   Procedure: MAXILLARY ANTROSTOMY WITH TISSUE REMOVAL;  Surgeon: Newman Pies, MD;  Location: [facility];  Service: ENT;  Laterality: Bilateral;   PH IMPEDANCE STUDY N/A 11/04/2022   Procedure: PH IMPEDANCE STUDY;  Surgeon: Napoleon Form, MD;  Location[facility];  Service: Gastroenterology;  Laterality: N/A;   SHOULDER OPEN ROTATOR CUFF REPAIR Right 09/01/2013   Procedure: RIGHT SHOULDER MINI OPEN ROTATOR CUFF REPAIR WITH SUBACROMIAL DECOMPRESSION;  Surgeon: Javier Docker, MD;  Location: [facility];  Service: Orthopedics;  Laterality: Right;   SINUS ENDO WITH FUSION Bilateral 08/15/2019   Procedure: SINUS ENDO WITH FUSION;  Surgeon: Newman Pies, MD;  Location: [facility];  Service: ENT;  Laterality: Bilateral;   SPHENOIDECTOMY Bilateral 08/15/2019   Procedure: SPHENOIDECTOMY;  Surgeon: Newman Pies, MD;  Location: [facility];  Service: ENT;  Laterality: Bilateral;   TONSILLECTOMY     SURGERY AS A CHILD   TURBINATE REDUCTION Bilateral 08/15/2019   Procedure: TURBINATE RECESSION;  Surgeon: Newman Pies,  MD;  Location: [facility];  Service: ENT;  Laterality: Bilateral;    Prior to Admission medications   Medication Sig Start Date End Date Taking? Authorizing Provider  acetaminophen (TYLENOL) 650 MG CR tablet Take 1,300 mg by mouth every 8 (eight) hours as needed for pain.   Yes [provider]  aspirin EC 81 MG tablet Take 81 mg by mouth daily. Swallow whole.   Yes [provider]  atorvastatin (LIPITOR) 20 MG tablet Take 20 mg by mouth daily with breakfast.    Yes [provider]  fluticasone (FLONASE) 50 MCG/ACT nasal spray Place 1 spray into both nostrils daily.   Yes [provider]  ibuprofen (ADVIL) 200 MG tablet Take 400 mg by mouth every 6 (six) hours as needed for moderate pain.   Yes [provider]  LORazepam (ATIVAN) 0.5 MG tablet Take 0.5 mg by mouth at bedtime as needed for sleep.   Yes [provider]  Menthol, Topical Analgesic, (ICY HOT EX) Apply 1 Application topically daily as needed (pain).   Yes [provider]  METAMUCIL FIBER PO Take 1 Scoop by mouth daily.   Yes [provider]  Multiple Vitamins-Minerals (CENTRUM SILVER PO) Take 1 tablet by mouth daily.   Yes [provider]  Polyethyl Glycol-Propyl Glycol (SYSTANE OP) Place 1 drop into both eyes daily.   Yes [provider]  sennosides-docusate sodium (SENOKOT-S) 8.6-50 MG tablet Take 1-2 tablets by mouth daily as needed for constipation.   Yes [provider]  SUMAtriptan (IMITREX) 100 MG tablet Take 100 mg by mouth every 2 (two) hours as needed for migraine.   Yes [provider]  valsartan (DIOVAN) 320 MG tablet Take 320 mg by mouth daily.   Yes [provider]  RABEprazole (ACIPHEX) 20 MG tablet Take 1 tablet (20 mg total) by mouth daily. Patient not taking: Reported on 12/28/2022 11/23/22   Corbin Ade, MD    Allergies as of 12/03/2022 - Review Complete 11/04/2022  Allergen Reaction  Noted   Bee venom Itching 03/06/2016   Codeine Itching 04/13/2013    Family History  Problem Relation Age of Onset   Hypertension Mother    Kidney failure Mother    Diabetes Mother    High blood pressure Father    High blood pressure Sister    Cancer Brother        brain tumor   High blood pressure Brother    Other Other    High blood pressure Brother    Migraines Daughter    Migraines Grandson    Migraines Granddaughter    Migraines Granddaughter    Colon cancer Neg Hx     Social History   Socioeconomic History   Marital status: Married    Spouse name: Not on file   Number of children: 3   Years of education: 13   Highest education level: High school graduate  Occupational History   Not on file  Tobacco Use   Smoking status: Never   Smokeless tobacco: Former    Types: Snuff    Quit date: 10/2015  Vaping Use   Vaping Use: Never used  Substance and Sexual Activity   Alcohol use: Yes    Comment: 1 beer a week or less   Drug use: No   Sexual activity: Yes    Birth control/protection: None  Other Topics Concern   Not on file  Social History Narrative   Lives at home with his wife Lindsay Gibeault   Right handed   Caffeine: 2 cups daily, coffee. Maybe 1 soda a day.   Social Determinants of Health   Financial Resource Strain: Not on file  Food Insecurity: Not on file  Transportation Needs: Not on file  Physical Activity: Not on file  Stress: Not on file  Social Connections: Not on file  Intimate Partner Violence: Not on file    Review of Systems: See HPI, otherwise negative ROS  Physical Exam: BP (!) 141/89   Pulse (!) 58   Temp 98 F (36.7 C) (Oral)   Resp 16   Ht 5\' 9"  (1.753 m)   Wt 93 kg   SpO2 100%   BMI 30.28 kg/m  General:   Alert,  Well-developed, well-nourished, pleasant and cooperative in NAD Skin:  Intact without significant lesions or rashes. Eyes:  Sclera clear, no icterus.   Conjunctiva pink. Ears:  Normal auditory acuity. Nose:   No deformity, discharge,  or lesions. Mouth:  No deformity or lesions. Neck:  Supple; no masses or thyromegaly. No significant cervical adenopathy. Lungs:  Clear throughout to auscultation.   No wheezes, crackles, or rhonchi. No acute distress. Heart:  Regular rate and rhythm; no murmurs, clicks, rubs,  or gallops. Abdomen: Non-distended, normal bowel sounds.  Soft and nontender without appreciable mass or hepatosplenomegaly.  Pulses:  Normal pulses noted. Extremities:  Without clubbing or edema.  Impression/Plan:   72 year old gentleman with a history colonic adenoma; here for surveillance colonoscopy.  Continues to have postprandial belching without improvement to acid suppression therapy has a weak esophageal peristalsis.  He does have abnormal reflux on ambulatory pH study off acid suppression therapy.  He states he is learning to "live with it".  Today, of offered him a surveillance colonoscopy. The risks, benefits, limitations, alternatives and imponderables have been reviewed with the patient. Questions have been answered. All parties are agreeable.       Notice: This dictation was prepared with Dragon dictation along with smaller phrase technology. Any transcriptional errors that result from this process are unintentional and may not be corrected upon review.

## 2022-12-30 NOTE — Transfer of Care (Signed)
Immediate Anesthesia Transfer of Care Note  Patient: [name]  Procedure(s) Performed: COLONOSCOPY WITH PROPOFOL  Patient Location: Short Stay  Anesthesia Type:General  Level of Consciousness: awake, alert , and oriented  Airway & Oxygen Therapy: Patient Spontanous Breathing  Post-op Assessment: Report given to RN, Post -op Vital signs reviewed and stable, Patient moving all extremities X 4, and Patient able to stick tongue midline  Post vital signs: Reviewed  Last Vitals:  Vitals Value Taken Time  BP 110/69 12/30/22 0944  Temp 36 C 12/30/22 0943  Pulse 83 12/30/22 0943  Resp 14 12/30/22 0943  SpO2 98 % 12/30/22 0943    Last Pain:  Vitals:   12/30/22 0943  TempSrc: Oral  PainSc: 0-No pain         Complications: No notable events documented.

## 2022-12-30 NOTE — Anesthesia Procedure Notes (Signed)
Procedure Name: General with mask airway Date/Time: 12/30/2022 9:21 AM  Performed by: Cy Blamer, CRNAPre-anesthesia Checklist: Patient identified, Emergency Drugs available, Patient being monitored and Timeout performed Patient Re-evaluated:Patient Re-evaluated prior to induction Oxygen Delivery Method: Nasal cannula Placement Confirmation: positive ETCO2 Dental Injury: Teeth and Oropharynx as per pre-operative assessment

## 2022-12-30 NOTE — Op Note (Signed)
[facility] Patient Name: [name] Procedure Date: 12/30/2022 8:54 AM MRN: [id number] Date of Birth: [birth year] Attending MD: Gennette Pac , MD, [id number] CSN: [id number] Age: 72 Admit Type: Outpatient Procedure:                Colonoscopy Indications:              High risk colon cancer surveillance: Personal                            history of colonic polyps Providers:                Gennette Pac, MD, Jannett Celestine, RN, Pandora Leiter, Technician Referring MD:              Medicines:                Propofol per Anesthesia Complications:            No immediate complications. Estimated Blood Loss:     Estimated blood loss: none. Procedure:                Pre-Anesthesia Assessment:                           - Prior to the procedure, a History and Physical                            was performed, and patient medications and                            allergies were reviewed. The patient's tolerance of                            previous anesthesia was also reviewed. The risks                            and benefits of the procedure and the sedation                            options and risks were discussed with the patient.                            All questions were answered, and informed consent                            was obtained. Prior Anticoagulants: The patient has                            taken no anticoagulant or antiplatelet agents. ASA                            Grade Assessment: III - A patient with severe  systemic disease. After reviewing the risks and                            benefits, the patient was deemed in satisfactory                            condition to undergo the procedure.                           After obtaining informed consent, the colonoscope                            was passed under direct vision. Throughout the                            procedure, the  patient's blood pressure, pulse, and                            oxygen saturations were monitored continuously. The                            [id number]) scope was introduced through the                            anus and advanced to the the cecum, identified by                            appendiceal orifice and ileocecal valve. The                            colonoscopy was performed without difficulty. The                            patient tolerated the procedure well. The quality                            of the bowel preparation was adequate. The                            ileocecal valve, appendiceal orifice, and rectum                            were photographed. Scope In: 9:23:35 AM Scope Out: 9:39:00 AM Scope Withdrawal Time: 0 hours 7 minutes 59 seconds  Total Procedure Duration: 0 hours 15 minutes 25 seconds  Findings:      The perianal and digital rectal examinations were normal.      Scattered medium-mouthed diverticula were found in the sigmoid colon and       descending colon.      The exam was otherwise without abnormality on direct and retroflexion       views. Impression:               - Diverticulosis in the sigmoid colon and in the  descending colon.                           - The examination was otherwise normal on direct                            and retroflexion views.                           - No specimens collected. Moderate Sedation:      Moderate (conscious) sedation was personally administered by an       anesthesia professional. The following parameters were monitored: oxygen       saturation, heart rate, blood pressure, respiratory rate, EKG, adequacy       of pulmonary ventilation, and response to care. Recommendation:           - Patient has a contact number available for                            emergencies. The signs and symptoms of potential                            delayed complications were discussed  with the                            patient. Return to normal activities tomorrow.                            Written discharge instructions were provided to the                            patient.                           - Advance diet as tolerated.                           - Continue present medications.                           - Repeat colonoscopy in 7 years for surveillance if                            overall health permits. Patient has had chronic                            bothersome postprandial belching. Extensive                            evaluation already done. Had a discussion with him                            today. I have offered him tertiary referral as he                            has a challenging problem. He does  not want to                            spend any more money. In fact, he has stated that                            he is learning to "live with it"..                           - Return to GI office in 3 months. Procedure Code(s):        --- Professional ---                           [phone], Colonoscopy, flexible; diagnostic, including                            collection of specimen(s) by brushing or washing,                            when performed (separate procedure) Diagnosis Code(s):        --- Professional ---                           Z86.010, Personal history of colonic polyps                           K57.30, Diverticulosis of large intestine without                            perforation or abscess without bleeding CPT copyright 2022 [employer]. All rights reserved. The codes documented in this report are preliminary and upon coder review may  be revised to meet current compliance requirements. [name]. Darrell Bachmeier, MD Gennette Pac, MD 12/30/2022 9:51:34 AM This report has been signed electronically. Number of Addenda: 0

## 2023-01-05 ENCOUNTER — Encounter (HOSPITAL_COMMUNITY): Payer: Self-pay | Admitting: Internal Medicine

## 2023-01-28 ENCOUNTER — Encounter: Payer: Self-pay | Admitting: Internal Medicine

## 2023-02-23 ENCOUNTER — Ambulatory Visit
Admission: EM | Admit: 2023-02-23 | Discharge: 2023-02-23 | Disposition: A | Discharge status: Home / Self Care | Payer: Medicare HMO | Attending: Family Medicine | Admitting: Family Medicine

## 2023-02-23 DIAGNOSIS — S39012A Strain of muscle, fascia and tendon of lower back, initial encounter: Secondary | ICD-10-CM

## 2023-02-23 LAB — POCT URINALYSIS DIP (MANUAL ENTRY)
Bilirubin, UA: NEGATIVE
Blood, UA: NEGATIVE
Glucose, UA: NEGATIVE mg/dL
Ketones, POC UA: NEGATIVE mg/dL
Leukocytes, UA: NEGATIVE
Nitrite, UA: NEGATIVE
Protein Ur, POC: NEGATIVE mg/dL
Spec Grav, UA: 1.02 (ref 1.010–1.025)
Urobilinogen, UA: 0.2 E.U./dL
pH, UA: 7 (ref 5.0–8.0)

## 2023-02-23 MED ORDER — DEXAMETHASONE SODIUM PHOSPHATE 10 MG/ML IJ SOLN
10.0000 mg | Freq: Once | INTRAMUSCULAR | Status: AC
  Administered 2023-02-23: 10 mg via INTRAMUSCULAR

## 2023-02-23 MED ORDER — TIZANIDINE HCL 2 MG PO CAPS: 2.0000 mg | ORAL_CAPSULE | Freq: Three times a day (TID) | ORAL | 0 refills | Status: DC | PRN

## 2023-02-23 NOTE — ED Triage Notes (Signed)
Pt reports he has pain in his lower right back area x 1 day.    Pt reports no injury. Took ne of his wife's muscle relaxer's not sure if it helped but it did help him sleep.

## 2023-02-24 DIAGNOSIS — Z1283 Encounter for screening for malignant neoplasm of skin: Secondary | ICD-10-CM | POA: Diagnosis not present

## 2023-02-24 DIAGNOSIS — D224 Melanocytic nevi of scalp and neck: Secondary | ICD-10-CM | POA: Diagnosis not present

## 2023-02-25 ENCOUNTER — Telehealth (HOSPITAL_COMMUNITY): Payer: Self-pay

## 2023-02-25 ENCOUNTER — Ambulatory Visit (HOSPITAL_COMMUNITY)
Admission: EM | Admit: 2023-02-25 | Discharge: 2023-02-25 | Disposition: A | Discharge status: Home / Self Care | Payer: Medicare HMO | Attending: Emergency Medicine | Admitting: Emergency Medicine

## 2023-02-25 ENCOUNTER — Telehealth (HOSPITAL_COMMUNITY): Payer: Self-pay | Admitting: Emergency Medicine

## 2023-02-25 ENCOUNTER — Encounter (HOSPITAL_COMMUNITY): Payer: Self-pay

## 2023-02-25 DIAGNOSIS — M545 Low back pain, unspecified: Secondary | ICD-10-CM

## 2023-02-25 MED ORDER — PREDNISONE 50 MG PO TABS: ORAL_TABLET | ORAL | 5 refills | Status: DC

## 2023-02-25 MED ORDER — KETOROLAC TROMETHAMINE 30 MG/ML IJ SOLN
30.0000 mg | Freq: Once | INTRAMUSCULAR | Status: AC
  Administered 2023-02-25: 30 mg via INTRAMUSCULAR

## 2023-02-25 MED ORDER — PREDNISONE 50 MG PO TABS: 50.0000 mg | ORAL_TABLET | Freq: Every day | ORAL | 0 refills | Status: DC

## 2023-02-25 MED ORDER — METHYLPREDNISOLONE SODIUM SUCC 125 MG IJ SOLR
INTRAMUSCULAR | Status: AC
  Filled 2023-02-25: qty 2

## 2023-02-25 MED ORDER — OXYCODONE HCL 5 MG PO TABS: 5.0000 mg | ORAL_TABLET | Freq: Four times a day (QID) | ORAL | 0 refills | Status: AC | PRN

## 2023-02-25 MED ORDER — METHYLPREDNISOLONE SODIUM SUCC 125 MG IJ SOLR
80.0000 mg | Freq: Once | INTRAMUSCULAR | Status: AC
  Administered 2023-02-25: 80 mg via INTRAMUSCULAR

## 2023-02-25 MED ORDER — KETOROLAC TROMETHAMINE 30 MG/ML IJ SOLN
INTRAMUSCULAR | Status: AC
  Filled 2023-02-25: qty 1

## 2023-02-25 NOTE — ED Triage Notes (Signed)
Patient c/o right lower back pain since 02/23/23. Patient denies radiation of pain down his buttocks or leg.  Patient denies any injury or heavy lifting.  Patient states he went to [facility] and was prescribed Tizanidine which he states is not working.

## 2023-02-25 NOTE — Discharge Instructions (Addendum)
Your pain is most likely caused by irritation to the muscles.    You have been given an injection of Toradol and methylprednisolone given today in office, both medicine to help to reduce inflammation which ideally would start to help reduce pain within 30 minutes to an hour  EXTR unremarkable take prednisone every morning with food for 5 days to continue the above process, may take Tylenol every 6 hours in addition to this  May continue to take muscle relaxant as needed  May take any over-the-counter topical medicine as needed such as IcyHot, lidocaine patches, diclofenac cream, capsaicin cream  You may use oxycodone every 6 hours as needed for severe pain, please be mindful this will make you feel sleepy   You may use heating pad in 15 minute intervals as needed for additional comfort, or you may find comfort in using ice in 10-15 minutes over affected area  Begin stretching affected area daily for 10 minutes as tolerated to further loosen muscles   When lying down place pillow underneath and between knees for support  Can try sleeping without pillow on firm mattress   Practice good posture: head back, shoulders back, chest forward, pelvis back and weight distributed evenly on both legs  If pain persist after recommended treatment or reoccurs if may be beneficial to follow up with orthopedic specialist for evaluation, this doctor specializes in the bones and can manage your symptoms long-term with options such as but not limited to imaging, medications or physical therapy

## 2023-02-25 NOTE — Telephone Encounter (Signed)
Patient pharmacy calling for clarification on patient's prednisone prescription sent in this morning.   Messages sent to the prescribing provider to clarify the script.

## 2023-02-25 NOTE — Telephone Encounter (Signed)
Pharmacy notified error with prescription, new prescription sent

## 2023-02-25 NOTE — ED Provider Notes (Signed)
[facility]    CSN: [id number] Arrival date & time: 02/25/23  6578      History   Chief Complaint Chief Complaint  Patient presents with   Back Pain    HPI Elston Aldape is a 72 y.o. male.   Patient presents for evaluation of constant right-sided low back pain that began 3 days ago without precipitating event, injury or trauma.  Pain is described as a pulling, can be felt with all movement causing limitations in range of motion, unable to fully bend, twist or turn.  Was evaluated in urgent care on initial day of the event, given steroid injection and prescribed muscle relaxers, no improvement in symptoms.  Additionally has used topical medication such as IcyHot which have provided no relief.  History of spinal surgeries.  Denies numbness, tingling, urinary or bowel incontinence.      Past Medical History:  Diagnosis Date   Anemia    COUPLE OF YRS AGO- NO PROBLEMS SINCE   Complication of anesthesia    REMEMBERS FEELING REALLY COLD WAKING UP   GERD (gastroesophageal reflux disease)    RARE    Hyperlipidemia    Hypertension    Migraine    Pain    RIGHT SHOULDER - ROTATOR CUFF TEAR; denies further pain 08/23/2018.    Patient Active Problem List   Diagnosis Date Noted   Heartburn 11/12/2022   Belching 03/16/2022   GERD (gastroesophageal reflux disease) 10/24/2018   Snoring 10/11/2018   Right temporal headache 08/23/2018   Hemorrhoids 02/08/2018   Constipation 11/02/2017   Taking medication for chronic disease 11/02/2017   Right rotator cuff tear 09/01/2013   Rotator cuff tear, right 09/01/2013    Past Surgical History:  Procedure Laterality Date   BACK SURGERY  2006   LOWER BACK FUSION - SCREWS AND RODS   BACK SURGERY  06/2017   BIOPSY  12/24/2021   Procedure: BIOPSY;  Surgeon: Corbin Ade, MD;  Location: [facility];  Service: Endoscopy;;   BUNIONECTOMY WITH WEIL OSTEOTOMY Right 04/11/2020   Procedure: Right scarf and modified McBride  bunionectomy, second and third metatarsal phalangeal weil osteotomy, third hammertoe correction with extensor tendon lengthening;  Surgeon: Toni Arthurs, MD;  Location: [facility];  Service: Orthopedics;  Laterality: Right;  90 mins [facility], choice   CATARACT EXTRACTION W/PHACO Right 11/26/2015   Procedure: CATARACT EXTRACTION PHACO AND INTRAOCULAR LENS PLACEMENT (IOC);  Surgeon: Jethro Bolus, MD;  Location: [facility];  Service: Ophthalmology;  Laterality: Right;  CDE:6.97   CATARACT EXTRACTION W/PHACO Left 12/10/2015   Procedure: CATARACT EXTRACTION PHACO AND INTRAOCULAR LENS PLACEMENT (IOC);  Surgeon: Jethro Bolus, MD;  Location: [facility];  Service: Ophthalmology;  Laterality: Left;  CDE:4.25   CHOLECYSTECTOMY     COLONOSCOPY WITH PROPOFOL N/A 12/02/2017   Procedure: COLONOSCOPY WITH PROPOFOL;  Surgeon: Corbin Ade, MD;  [facility]: [facility];  Service: Endoscopy;  Laterality: N/A;  8:30am   COLONOSCOPY WITH PROPOFOL N/A 12/30/2022   Procedure: COLONOSCOPY WITH PROPOFOL;  Surgeon: Corbin Ade, MD;  [facility]: [facility];  Service: Endoscopy;  Laterality: N/A;  915am, asa 2   ESOPHAGEAL MANOMETRY N/A 11/04/2022   Procedure: ESOPHAGEAL MANOMETRY (EM);  Surgeon: Napoleon Form, MD;  Location: [facility];  Service: Gastroenterology;  Laterality: N/A;   ESOPHAGOGASTRODUODENOSCOPY (EGD) WITH PROPOFOL N/A 12/24/2021   Procedure: ESOPHAGOGASTRODUODENOSCOPY (EGD) WITH PROPOFOL;  Surgeon: Corbin Ade, MD;  Location: [facility];  Service: Endoscopy;  Laterality: N/A;  3:00pm  ETHMOIDECTOMY Bilateral 08/15/2019   Procedure: ETHMOIDECTOMY;  Surgeon: Newman Pies, MD;  Location: [facility];  Service: ENT;  Laterality: Bilateral;   FINGERNAIL REMOVED RT INDEX FINGER - FOR CYST THAT WAS BEHIND THE NAIL     FRONTAL SINUS EXPLORATION Bilateral 08/15/2019   Procedure: FRONTAL SINUS EXPLORATION;  Surgeon: Newman Pies, MD;  Location: [facility];  Service: ENT;   Laterality: Bilateral;   left thumb surgery Left 06/2018   Dr. Hartford Poli SPINE SURGERY  08/11/2017   disc repair, Dr. Ned Card   MAXILLARY ANTROSTOMY Bilateral 08/15/2019   Procedure: MAXILLARY ANTROSTOMY WITH TISSUE REMOVAL;  Surgeon: Newman Pies, MD;  Location: [facility];  Service: ENT;  Laterality: Bilateral;   PH IMPEDANCE STUDY N/A 11/04/2022   Procedure: PH IMPEDANCE STUDY;  Surgeon: Napoleon Form, MD;  Location: [facility];  Service: Gastroenterology;  Laterality: N/A;   SHOULDER OPEN ROTATOR CUFF REPAIR Right 09/01/2013   Procedure: RIGHT SHOULDER MINI OPEN ROTATOR CUFF REPAIR WITH SUBACROMIAL DECOMPRESSION;  Surgeon: Javier Docker, MD;  Location: [facility];  Service: Orthopedics;  Laterality: Right;   SINUS ENDO WITH FUSION Bilateral 08/15/2019   Procedure: SINUS ENDO WITH FUSION;  Surgeon: Newman Pies, MD;  Location: [facility];  Service: ENT;  Laterality: Bilateral;   SPHENOIDECTOMY Bilateral 08/15/2019   Procedure: SPHENOIDECTOMY;  Surgeon: Newman Pies, MD;  Location: [facility];  Service: ENT;  Laterality: Bilateral;   TONSILLECTOMY     SURGERY AS A CHILD   TURBINATE REDUCTION Bilateral 08/15/2019   Procedure: TURBINATE RECESSION;  Surgeon: Newman Pies, MD;  Location:  [facility];  Service: ENT;  Laterality: Bilateral;       Home Medications    Prior to Admission medications   Medication Sig Start Date End Date Taking? Authorizing Provider  acetaminophen (TYLENOL) 650 MG CR tablet Take 1,300 mg by mouth every 8 (eight) hours as needed for pain.    [provider]  aspirin EC 81 MG tablet Take 81 mg by mouth daily. Swallow whole.    [provider]  atorvastatin (LIPITOR) 20 MG tablet Take 20 mg by mouth daily with breakfast.     [provider]  fluticasone (FLONASE) 50 MCG/ACT nasal spray Place 1 spray into both nostrils daily.    [provider]  ibuprofen (ADVIL) 200 MG  tablet Take 400 mg by mouth every 6 (six) hours as needed for moderate pain.    [provider]  LORazepam (ATIVAN) 0.5 MG tablet Take 0.5 mg by mouth at bedtime as needed for sleep.    [provider]  Menthol, Topical Analgesic, (ICY HOT EX) Apply 1 Application topically daily as needed (pain).    [provider]  METAMUCIL FIBER PO Take 1 Scoop by mouth daily.    [provider]  Multiple Vitamins-Minerals (CENTRUM SILVER PO) Take 1 tablet by mouth daily.    [provider]  Polyethyl Glycol-Propyl Glycol (SYSTANE OP) Place 1 drop into both eyes daily.    [provider]  RABEprazole (ACIPHEX) 20 MG tablet Take 1 tablet (20 mg total) by mouth daily. Patient not taking: Reported on 12/28/2022 11/23/22   Corbin Ade, MD  sennosides-docusate sodium (SENOKOT-S) 8.6-50 MG tablet Take 1-2 tablets by mouth daily as needed for constipation.    [provider]  SUMAtriptan (IMITREX) 100 MG tablet Take 100 mg by mouth every 2 (two) hours as needed for migraine.  [provider]  tizanidine (ZANAFLEX) 2 MG capsule Take 1 capsule (2 mg total) by mouth 3 (three) times daily as needed for muscle spasms. Do not drink alcohol or drive while taking this medication.  May cause drowsiness. 02/23/23   Particia Nearing, PA-C  valsartan (DIOVAN) 320 MG tablet Take 320 mg by mouth daily.    [provider]    Family History Family History  Problem Relation Age of Onset   Hypertension Mother    Kidney failure Mother    Diabetes Mother    High blood pressure Father    High blood pressure Sister    Cancer Brother        brain tumor   High blood pressure Brother    Other Other    High blood pressure Brother    Migraines Daughter    Migraines Grandson    Migraines Granddaughter    Migraines Granddaughter    Colon cancer Neg Hx     Social History Social History   Tobacco Use   Smoking status: Never   Smokeless  tobacco: Former    Types: Snuff    Quit date: 10/2015  Vaping Use   Vaping status: Never Used  Substance Use Topics   Alcohol use: Yes    Comment: 1 beer a week or less   Drug use: No     Allergies   Bee venom and Codeine   Review of Systems Review of Systems  Constitutional: Negative.   HENT: Negative.    Respiratory: Negative.    Cardiovascular: Negative.   Musculoskeletal:  Positive for back pain. Negative for arthralgias, gait problem, joint swelling, myalgias, neck pain and neck stiffness.     Physical Exam Triage Vital Signs ED Triage Vitals  Encounter Vitals Group     BP 02/25/23 0909 (!) 163/90     Systolic BP Percentile --      Diastolic BP Percentile --      Pulse Rate 02/25/23 0909 (!) 52     Resp 02/25/23 0909 16     Temp 02/25/23 0909 98.2 F (36.8 C)     Temp Source 02/25/23 0909 Oral     SpO2 02/25/23 0909 97 %     Weight --      Height --      Head Circumference --      Peak Flow --      Pain Score 02/25/23 0911 8     Pain Loc --      Pain Education --      Exclude from Growth Chart --    No data found.  Updated Vital Signs BP (!) 163/90 (BP Location: Right Arm)   Pulse (!) 52   Temp 98.2 F (36.8 C) (Oral)   Resp 16   SpO2 97%   Visual Acuity Right Eye Distance:   Left Eye Distance:   Bilateral Distance:    Right Eye Near:   Left Eye Near:    Bilateral Near:     Physical Exam Musculoskeletal:     Comments: Tenderness is present over the right lower latissimus dorsi without ecchymosis swelling or deformity, limited range of motion, only able to brand approximately 45%, limitations on rotation, positive straight leg test      UC Treatments / Results  Labs (all labs ordered are listed, but only abnormal results are displayed) Labs Reviewed - No data to display  EKG   Radiology No results found.  Procedures Procedures (including critical care  time)  Medications Ordered in UC Medications - No data to  display  Initial Impression / Assessment and Plan / UC Course  I have reviewed the triage vital signs and the nursing notes.  Pertinent labs & imaging results that were available during my care of the patient were reviewed by me and considered in my medical decision making (see chart for details).  Acute right-sided low back pain without sciatica  Etiology appears to be muscular, no spinal tenderness noted on exam therefore imaging deferred, Toradol and methylprednisolone injection given in office, prescribed prednisone and oxycodone for outpatient use, PDMP reviewed, low risk, recommended supportive care through [facility], heat massage stretching and activity as tolerated, advise follow-up with orthopedics if symptoms continue to persist or worsen Final Clinical Impressions(s) / UC Diagnoses   Final diagnoses:  None   Discharge Instructions   None    ED Prescriptions   None    PDMP not reviewed this encounter.   Valinda Hoar, [place] 02/25/23 [id number]

## 2023-02-25 NOTE — ED Notes (Addendum)
Patient c/o pain after he received a Toradol injection. Patient received a second injection of Solumedrol and when the needle was withdrawn the writer hit the base of the needle on own thumb which cause the needle to bend.  Patient was discharged and then wanted to see the provider.

## 2023-02-26 ENCOUNTER — Other Ambulatory Visit: Payer: Self-pay

## 2023-02-26 ENCOUNTER — Encounter (HOSPITAL_COMMUNITY): Payer: Self-pay

## 2023-02-26 ENCOUNTER — Emergency Department (HOSPITAL_COMMUNITY)
Admission: EM | Admit: 2023-02-26 | Discharge: 2023-02-26 | Disposition: A | Discharge status: Home / Self Care | Payer: Medicare HMO | Attending: Emergency Medicine | Admitting: Emergency Medicine

## 2023-02-26 ENCOUNTER — Emergency Department (HOSPITAL_COMMUNITY): Payer: Medicare HMO

## 2023-02-26 DIAGNOSIS — M5136 Other intervertebral disc degeneration, lumbar region: Secondary | ICD-10-CM | POA: Diagnosis not present

## 2023-02-26 DIAGNOSIS — M5135 Other intervertebral disc degeneration, thoracolumbar region: Secondary | ICD-10-CM | POA: Diagnosis not present

## 2023-02-26 DIAGNOSIS — Z7982 Long term (current) use of aspirin: Secondary | ICD-10-CM | POA: Diagnosis not present

## 2023-02-26 DIAGNOSIS — M5441 Lumbago with sciatica, right side: Secondary | ICD-10-CM | POA: Diagnosis not present

## 2023-02-26 DIAGNOSIS — M545 Low back pain, unspecified: Secondary | ICD-10-CM | POA: Diagnosis not present

## 2023-02-26 DIAGNOSIS — M48061 Spinal stenosis, lumbar region without neurogenic claudication: Secondary | ICD-10-CM | POA: Diagnosis not present

## 2023-02-26 LAB — BASIC METABOLIC PANEL
Anion gap: 9 (ref 5–15)
BUN: 24 mg/dL — ABNORMAL HIGH (ref 8–23)
CO2: 20 mmol/L — ABNORMAL LOW (ref 22–32)
Calcium: 8.6 mg/dL — ABNORMAL LOW (ref 8.9–10.3)
Chloride: 107 mmol/L (ref 98–111)
Creatinine, Ser: 0.95 mg/dL (ref 0.61–1.24)
GFR, Estimated: >60 mL/min (ref >60)
Glucose, Bld: 90 mg/dL (ref 70–99)
Potassium: 4.2 mmol/L (ref 3.5–5.1)
Sodium: 136 mmol/L (ref 135–145)

## 2023-02-26 LAB — CBC WITH DIFFERENTIAL/PLATELET
Abs Immature Granulocytes: 0.07 10*3/uL (ref 0.00–0.07)
Basophils Absolute: 0 10*3/uL (ref 0.0–0.1)
Basophils Relative: 0 %
Eosinophils Absolute: 0 10*3/uL (ref 0.0–0.5)
Eosinophils Relative: 0 %
HCT: 41 % (ref 39.0–52.0)
Hemoglobin: 13.7 g/dL (ref 13.0–17.0)
Immature Granulocytes: 1 %
Lymphocytes Relative: 17 %
Lymphs Abs: 2.4 10*3/uL (ref 0.7–4.0)
MCH: 31.1 pg (ref 26.0–34.0)
MCHC: 33.4 g/dL (ref 30.0–36.0)
MCV: 93 fL (ref 80.0–100.0)
Monocytes Absolute: 1.2 10*3/uL — ABNORMAL HIGH (ref 0.1–1.0)
Monocytes Relative: 8 %
Neutro Abs: 10.5 10*3/uL — ABNORMAL HIGH (ref 1.7–7.7)
Neutrophils Relative %: 74 %
Platelets: 271 10*3/uL (ref 150–400)
RBC: 4.41 MIL/uL (ref 4.22–5.81)
RDW: 12.2 % (ref 11.5–15.5)
WBC: 14.1 10*3/uL — ABNORMAL HIGH (ref 4.0–10.5)
nRBC: 0 % (ref 0.0–0.2)

## 2023-02-26 MED ORDER — DIPHENHYDRAMINE HCL 50 MG/ML IJ SOLN
25.0000 mg | Freq: Once | INTRAMUSCULAR | Status: AC
  Administered 2023-02-26: 25 mg via INTRAVENOUS
  Filled 2023-02-26: qty 1

## 2023-02-26 MED ORDER — LIDOCAINE 5 % EX PTCH: 1.0000 | MEDICATED_PATCH | CUTANEOUS | 0 refills | Status: DC

## 2023-02-26 MED ORDER — MORPHINE SULFATE (PF) 4 MG/ML IV SOLN
4.0000 mg | Freq: Once | INTRAVENOUS | Status: AC
  Administered 2023-02-26: 4 mg via INTRAVENOUS
  Filled 2023-02-26: qty 1

## 2023-02-26 MED ORDER — ONDANSETRON HCL 4 MG/2ML IJ SOLN: 4.0000 mg | Freq: Once | INTRAMUSCULAR | Status: DC

## 2023-02-26 NOTE — ED Provider Notes (Signed)
EMERGENCY DEPARTMENT AT [facility] Provider Note   CSN: [id number] Arrival date & time: 02/26/23  0756     History  Chief Complaint  Patient presents with   Back Pain   Abdominal Pain    [name] is a 72 y.o. male.  72 year old male with prior medical history as detailed below presents for evaluation.  Patient reports persistent right-sided low back pain x 1 week.  Patient has been seen at urgent care twice for same complaint.  He has been given a steroid injection, prescriptions for oxycodone, and prescriptions for muscle relaxant.  He reports that these interventions have not helped significantly.  Patient reports that he has had no blood work or imaging performed at the urgent cares.  He requests same.  Patient reports prior history of lumbar back surgical procedures x 2.  He has not been able to arrange a close follow-up with his prior spine provider (Dr. Sharolyn Douglas).  He reports that he thinks he has an upcoming appointment with his spine specialist within the next week or so.    The history is provided by the patient and medical records.       Home Medications Prior to Admission medications   Medication Sig Start Date End Date Taking? Authorizing Provider  acetaminophen (TYLENOL) 650 MG CR tablet Take 1,300 mg by mouth every 8 (eight) hours as needed for pain.    [provider]  aspirin EC 81 MG tablet Take 81 mg by mouth daily. Swallow whole.    [provider]  atorvastatin (LIPITOR) 20 MG tablet Take 20 mg by mouth daily with breakfast.     [provider]  fluticasone (FLONASE) 50 MCG/ACT nasal spray Place 1 spray into both nostrils daily.    [provider]  ibuprofen (ADVIL) 200 MG tablet Take 400 mg by mouth every 6 (six) hours as needed for moderate pain.    [provider]  LORazepam (ATIVAN) 0.5 MG tablet Take 0.5 mg by mouth at bedtime as needed for sleep.    [provider]   Menthol, Topical Analgesic, (ICY HOT EX) Apply 1 Application topically daily as needed (pain).    [provider]  METAMUCIL FIBER PO Take 1 Scoop by mouth daily.    [provider]  Multiple Vitamins-Minerals (CENTRUM SILVER PO) Take 1 tablet by mouth daily.    [provider]  oxyCODONE (ROXICODONE) 5 MG immediate release tablet Take 1 tablet (5 mg total) by mouth every 6 (six) hours as needed for up to 5 days for severe pain. 02/25/23 03/02/23  Valinda Hoar, NP  Polyethyl Glycol-Propyl Glycol (SYSTANE OP) Place 1 drop into both eyes daily.    [provider]  predniSONE (DELTASONE) 50 MG tablet Take 1 tablet (50 mg total) by mouth daily. 02/25/23   White, Elita Boone, NP  RABEprazole (ACIPHEX) 20 MG tablet Take 1 tablet (20 mg total) by mouth daily. Patient not taking: Reported on 12/28/2022 11/23/22   Corbin Ade, MD  sennosides-docusate sodium (SENOKOT-S) 8.6-50 MG tablet Take 1-2 tablets by mouth daily as needed for constipation.    [provider]  SUMAtriptan (IMITREX) 100 MG tablet Take 100 mg by mouth every 2 (two) hours as needed for migraine.    [provider]  tizanidine (ZANAFLEX) 2 MG capsule Take 1 capsule (2 mg total) by mouth 3 (three) times daily as needed for muscle spasms. Do not drink alcohol or drive while taking this  medication.  May cause drowsiness. 02/23/23   Particia Nearing, PA-C  valsartan (DIOVAN) 320 MG tablet Take 320 mg by mouth daily.    [provider]      Allergies    Bee venom and Codeine    Review of Systems   Review of Systems  All other systems reviewed and are negative.   Physical Exam Updated Vital Signs BP (!) 171/79   Pulse (!) 58   Temp (!) 97.4 F (36.3 C) (Oral)   Resp 18   Ht 5\' 9"  (1.753 m)   Wt 95.3 kg   SpO2 100%   BMI 31.01 kg/m  Physical Exam Vitals and nursing note reviewed.  Constitutional:      General: He is not in acute distress.    Appearance:  Normal appearance. He is well-developed.  HENT:     Head: Normocephalic and atraumatic.  Eyes:     Conjunctiva/sclera: Conjunctivae normal.     Pupils: Pupils are equal, round, and reactive to light.  Cardiovascular:     Rate and Rhythm: Normal rate and regular rhythm.     Heart sounds: Normal heart sounds.  Pulmonary:     Effort: Pulmonary effort is normal. No respiratory distress.     Breath sounds: Normal breath sounds.  Abdominal:     General: There is no distension.     Palpations: Abdomen is soft.     Tenderness: There is no abdominal tenderness.  Musculoskeletal:        General: No deformity. Normal range of motion.     Cervical back: Normal range of motion and neck supple.  Skin:    General: Skin is warm and dry.  Neurological:     General: No focal deficit present.     Mental Status: He is alert and oriented to person, place, and time.     Cranial Nerves: No cranial nerve deficit.     Motor: No weakness.     Comments: Mild tenderness with palpation along the right paraspinal musculature into the right upper buttock.  Patient reports pain radiates from the right low back down to his right lateral hip.  5 of 5 strength in both lower extremities.  Intact sensation grossly to the bilateral lower extremities distally.  BLE are neurovascular intact     ED Results / Procedures / Treatments   Labs (all labs ordered are listed, but only abnormal results are displayed) Labs Reviewed  CBC WITH DIFFERENTIAL/PLATELET  BASIC METABOLIC PANEL  URINALYSIS, W/ REFLEX TO CULTURE (INFECTION SUSPECTED)    EKG None  Radiology No results found.  Procedures Procedures    Medications Ordered in ED Medications  morphine (PF) 4 MG/ML injection 4 mg (has no administration in time range)  diphenhydrAMINE (BENADRYL) injection 25 mg (has no administration in time range)    ED Course/ Medical Decision Making/ A&P                             Medical Decision Making Amount and/or  Complexity of Data Reviewed Labs: ordered. Radiology: ordered.  Risk Prescription drug management.    Medical Screen Complete  This patient presented to the ED with complaint of acute on chronic back pain.  This complaint involves an extensive number of treatment options. The initial differential diagnosis includes, but is not limited to, discitis, epidural abscess, osteomyelitis, osteoarthritis, etc.  This presentation is: Acute, Chronic, Self-Limited, Previously Undiagnosed, Uncertain Prognosis, Complicated, Systemic Symptoms, and  Threat to Life/Bodily Function  Patient with known history of back pain status post multiple prior spine interventions with Dr. Noel Gerold.  Patient is requesting MRI imaging given increased low back pain x 1 week.  MRI does demonstrate acute pathology requiring admission.  Patient is reassured by same.  Patient already has prescription for a burst of steroids which she is going to start taking today.  Patient is requesting a prescription for Lidoderm patches as well.  Patient additionally reports that he has oxycodone already at home.  He does have an appointment already scheduled with Dr. Wells Guiles group next week.  Importance of close follow-up is stressed.  Strict return precautions given and understood. Additional history obtained:  Additional history obtained from Spouse External records from outside sources obtained and reviewed including prior ED visits and prior Inpatient records.    Lab Tests:  I ordered and personally interpreted labs.    Imaging Studies ordered:  I ordered imaging studies including MRI L-spine  I agree with the radiologist interpretation.   Cardiac Monitoring:  The patient was maintained on a cardiac monitor.  I personally viewed and interpreted the cardiac monitor which showed an underlying rhythm of: NSR   Medicines ordered:  I ordered medication including Benadryl, morphine for pain Reevaluation of the patient  after these medicines showed that the patient: improved   Problem List / ED Course:  Acute on chronic low back pain   Reevaluation:  After the interventions noted above, I reevaluated the patient and found that they have: improved Disposition:  After consideration of the diagnostic results and the patients response to treatment, I feel that the patent would benefit from close outpatient follow-up.          Final Clinical Impression(s) / ED Diagnoses Final diagnoses:  Acute right-sided low back pain with right-sided sciatica    Rx / DC Orders ED Discharge Orders          Ordered    lidocaine (LIDODERM) 5 %  Every 24 hours        02/26/23 1201              Wynetta Fines, MD 02/26/23 1544

## 2023-02-26 NOTE — ED Triage Notes (Signed)
Pt reports right lower back pain since Monday, was seen at [facility] yesterday and was given a few shots for pain but this morning the pain radiates to the RLQ and his right leg feels weaker.

## 2023-02-26 NOTE — Discharge Instructions (Signed)
Return for any problem.  ?

## 2023-02-26 NOTE — ED Provider Notes (Signed)
[facility]    CSN: [id number] Arrival date & time: 02/23/23  4098      History   Chief Complaint No chief complaint on file.   HPI [name] is a 72 y.o. male.   Patient presenting today with new onset right lateral low back pain that started last night.  He denies any injuries prior to onset or new activities, new mattress or other possible triggers that he is aware of.  He states the pain is constant, sometimes worse in certain positions.  Denies associated leg weakness, numbness, tingling, bowel or bladder incontinence, saddle anesthesias, hematuria, dysuria, urinary frequency, fevers.  Tried his wife's muscle relaxer overnight which helped him sleep but did not help with the pain.  No known history of similar issues but has had a lumbar fusion in the past.    Past Medical History:  Diagnosis Date   Anemia    COUPLE OF YRS AGO- NO PROBLEMS SINCE   Complication of anesthesia    REMEMBERS FEELING REALLY COLD WAKING UP   GERD (gastroesophageal reflux disease)    RARE    Hyperlipidemia    Hypertension    Migraine    Pain    RIGHT SHOULDER - ROTATOR CUFF TEAR; denies further pain 08/23/2018.    Patient Active Problem List   Diagnosis Date Noted   Heartburn 11/12/2022   Belching 03/16/2022   GERD (gastroesophageal reflux disease) 10/24/2018   Snoring 10/11/2018   Right temporal headache 08/23/2018   Hemorrhoids 02/08/2018   Constipation 11/02/2017   Taking medication for chronic disease 11/02/2017   Right rotator cuff tear 09/01/2013   Rotator cuff tear, right 09/01/2013    Past Surgical History:  Procedure Laterality Date   BACK SURGERY  2006   LOWER BACK FUSION - SCREWS AND RODS   BACK SURGERY  06/2017   BIOPSY  12/24/2021   Procedure: BIOPSY;  Surgeon: Corbin Ade, MD;  [facility]: [facility];  Service: Endoscopy;;   BUNIONECTOMY WITH WEIL OSTEOTOMY Right 04/11/2020   Procedure: Right scarf and modified McBride bunionectomy, second and  third metatarsal phalangeal weil osteotomy, third hammertoe correction with extensor tendon lengthening;  Surgeon: Toni Arthurs, MD;  Location: [facility];  Service: Orthopedics;  Laterality: Right;  90 mins [facility], choice   CATARACT EXTRACTION W/PHACO Right 11/26/2015   Procedure: CATARACT EXTRACTION PHACO AND INTRAOCULAR LENS PLACEMENT (IOC);  Surgeon: Jethro Bolus, MD;  Location: [facility];  Service: Ophthalmology;  Laterality: Right;  CDE:6.97   CATARACT EXTRACTION W/PHACO Left 12/10/2015   Procedure: CATARACT EXTRACTION PHACO AND INTRAOCULAR LENS PLACEMENT (IOC);  Surgeon: Jethro Bolus, MD;  Location: [facility];  Service: Ophthalmology;  Laterality: Left;  CDE:4.25   CHOLECYSTECTOMY     COLONOSCOPY WITH PROPOFOL N/A 12/02/2017   Procedure: COLONOSCOPY WITH PROPOFOL;  Surgeon: Corbin Ade, MD;  [facility];  Service: Endoscopy;  Laterality: N/A;  8:30am   COLONOSCOPY WITH PROPOFOL N/A 12/30/2022   Procedure: COLONOSCOPY WITH PROPOFOL;  Surgeon: Corbin Ade, MD;  [facility];  Service: Endoscopy;  Laterality: N/A;  915am, asa 2   ESOPHAGEAL MANOMETRY N/A 11/04/2022   Procedure: ESOPHAGEAL MANOMETRY (EM);  Surgeon: Napoleon Form, MD;  Location: [facility];  Service: Gastroenterology;  Laterality: N/A;   ESOPHAGOGASTRODUODENOSCOPY (EGD) WITH PROPOFOL N/A 12/24/2021   Procedure: ESOPHAGOGASTRODUODENOSCOPY (EGD) WITH PROPOFOL;  Surgeon: Corbin Ade, MD;  Location: [facility];  Service: Endoscopy;  Laterality: N/A;  3:00pm   ETHMOIDECTOMY Bilateral  08/15/2019   Procedure: ETHMOIDECTOMY;  Surgeon: Newman Pies, MD;  Location: [facility];  Service: ENT;  Laterality: Bilateral;   FINGERNAIL REMOVED RT INDEX FINGER - FOR CYST THAT WAS BEHIND THE NAIL     FRONTAL SINUS EXPLORATION Bilateral 08/15/2019   Procedure: FRONTAL SINUS EXPLORATION;  Surgeon: Newman Pies, MD;  Location: [facility];  Service: ENT;  Laterality: Bilateral;    left thumb surgery Left 06/2018   Dr. Hartford Poli SPINE SURGERY  08/11/2017   disc repair, Dr. Ned Card   MAXILLARY ANTROSTOMY Bilateral 08/15/2019   Procedure: MAXILLARY ANTROSTOMY WITH TISSUE REMOVAL;  Surgeon: Newman Pies, MD;  Location: [facility];  Service: ENT;  Laterality: Bilateral;   PH IMPEDANCE STUDY N/A 11/04/2022   Procedure: PH IMPEDANCE STUDY;  Surgeon: Napoleon Form, MD;  Location: [facility];  Service: Gastroenterology;  Laterality: N/A;   SHOULDER OPEN ROTATOR CUFF REPAIR Right 09/01/2013   Procedure: RIGHT SHOULDER MINI OPEN ROTATOR CUFF REPAIR WITH SUBACROMIAL DECOMPRESSION;  Surgeon: Javier Docker, MD;  Location: [facility];  Service: Orthopedics;  Laterality: Right;   SINUS ENDO WITH FUSION Bilateral 08/15/2019   Procedure: SINUS ENDO WITH FUSION;  Surgeon: Newman Pies, MD;  Location: [facility];  Service: ENT;  Laterality: Bilateral;   SPHENOIDECTOMY Bilateral 08/15/2019   Procedure: SPHENOIDECTOMY;  Surgeon: Newman Pies, MD;  Location: [facility];  Service: ENT;  Laterality: Bilateral;   TONSILLECTOMY     SURGERY AS A CHILD   TURBINATE REDUCTION Bilateral 08/15/2019   Procedure: TURBINATE RECESSION;  Surgeon: Newman Pies, MD;  Location: [facility];  Service: ENT;  Laterality: Bilateral;       Home Medications    Prior to Admission medications   Medication Sig Start Date End Date Taking? Authorizing Provider  tizanidine (ZANAFLEX) 2 MG capsule Take 1 capsule (2 mg total) by mouth 3 (three) times daily as needed for muscle spasms. Do not drink alcohol or drive while taking this medication.  May cause drowsiness. 02/23/23  Yes Particia Nearing, PA-C  acetaminophen (TYLENOL) 650 MG CR tablet Take 1,300 mg by mouth every 8 (eight) hours as needed for pain.    [provider]  aspirin EC 81 MG tablet Take 81 mg by mouth daily. Swallow whole.    [provider]  atorvastatin (LIPITOR) 20 MG  tablet Take 20 mg by mouth daily with breakfast.     [provider]  fluticasone (FLONASE) 50 MCG/ACT nasal spray Place 1 spray into both nostrils daily.    [provider]  ibuprofen (ADVIL) 200 MG tablet Take 400 mg by mouth every 6 (six) hours as needed for moderate pain.    [provider]  lidocaine (LIDODERM) 5 % Place 1 patch onto the skin daily. Remove & Discard patch within 12 hours or as directed by MD 02/26/23   Wynetta Fines, MD  LORazepam (ATIVAN) 0.5 MG tablet Take 0.5 mg by mouth at bedtime as needed for sleep.    [provider]  Menthol, Topical Analgesic, (ICY HOT EX) Apply 1 Application topically daily as needed (pain).    [provider]  METAMUCIL FIBER PO Take 1 Scoop by mouth daily.    [provider]  Multiple Vitamins-Minerals (CENTRUM SILVER PO) Take 1 tablet by mouth daily.    [provider]  oxyCODONE (ROXICODONE) 5 MG immediate release tablet Take 1 tablet (5 mg total) by mouth every  6 (six) hours as needed for up to 5 days for severe pain. 02/25/23 03/02/23  Valinda Hoar, NP  Polyethyl Glycol-Propyl Glycol (SYSTANE OP) Place 1 drop into both eyes daily.    [provider]  predniSONE (DELTASONE) 50 MG tablet Take 1 tablet (50 mg total) by mouth daily. 02/25/23   White, Elita Boone, NP  RABEprazole (ACIPHEX) 20 MG tablet Take 1 tablet (20 mg total) by mouth daily. Patient not taking: Reported on 12/28/2022 11/23/22   Corbin Ade, MD  sennosides-docusate sodium (SENOKOT-S) 8.6-50 MG tablet Take 1-2 tablets by mouth daily as needed for constipation.    [provider]  SUMAtriptan (IMITREX) 100 MG tablet Take 100 mg by mouth every 2 (two) hours as needed for migraine.    [provider]  valsartan (DIOVAN) 320 MG tablet Take 320 mg by mouth daily.    [provider]    Family History Family History  Problem Relation Age of Onset   Hypertension Mother    Kidney  failure Mother    Diabetes Mother    High blood pressure Father    High blood pressure Sister    Cancer Brother        brain tumor   High blood pressure Brother    Other Other    High blood pressure Brother    Migraines Daughter    Migraines Grandson    Migraines Granddaughter    Migraines Granddaughter    Colon cancer Neg Hx     Social History Social History   Tobacco Use   Smoking status: Never   Smokeless tobacco: Former    Types: Snuff    Quit date: 10/2015  Vaping Use   Vaping status: Never Used  Substance Use Topics   Alcohol use: Yes    Comment: 1 beer a week or less   Drug use: No     Allergies   Bee venom and Codeine   Review of Systems Review of Systems Per HPI  Physical Exam Triage Vital Signs ED Triage Vitals  Encounter Vitals Group     BP 02/23/23 0833 (!) 169/98     Systolic BP Percentile --      Diastolic BP Percentile --      Pulse Rate 02/23/23 0833 (!) 59     Resp 02/23/23 0833 18     Temp 02/23/23 0833 97.7 F (36.5 C)     Temp Source 02/23/23 0833 Oral     SpO2 02/23/23 0833 96 %     Weight --      Height --      Head Circumference --      Peak Flow --      Pain Score 02/23/23 0835 8     Pain Loc --      Pain Education --      Exclude from Growth Chart --    No data found.  Updated Vital Signs BP (!) 169/98 (BP Location: Right Arm)   Pulse (!) 59   Temp 97.7 F (36.5 C) (Oral)   Resp 18   SpO2 96%   Visual Acuity Right Eye Distance:   Left Eye Distance:   Bilateral Distance:    Right Eye Near:   Left Eye Near:    Bilateral Near:     Physical Exam Vitals and nursing note reviewed.  Constitutional:      Appearance: Normal appearance.  HENT:     Head: Atraumatic.  Eyes:  Extraocular Movements: Extraocular movements intact.     Conjunctiva/sclera: Conjunctivae normal.  Cardiovascular:     Rate and Rhythm: Normal rate and regular rhythm.  Pulmonary:     Effort: Pulmonary effort is normal.     Breath  sounds: Normal breath sounds.  Musculoskeletal:        General: Tenderness present. No swelling, deformity or signs of injury. Normal range of motion.     Cervical back: Normal range of motion and neck supple.     Comments: Tender to palpation right lateral lumbar musculature Negative straight leg raise bilateral lower extremities, normal gait and range of motion though movements antalgic.  Skin:    General: Skin is warm and dry.  Neurological:     General: No focal deficit present.     Mental Status: He is oriented to person, place, and time.     Motor: No weakness.     Gait: Gait normal.     Comments: Bilateral lower extremities neurovascularly intact  Psychiatric:        Mood and Affect: Mood normal.        Thought Content: Thought content normal.        Judgment: Judgment normal.      UC Treatments / Results  Labs (all labs ordered are listed, but only abnormal results are displayed) Labs Reviewed  POCT URINALYSIS DIP (MANUAL ENTRY) - Abnormal; Notable for the following components:      Result Value   Color, UA light yellow (*)    All other components within normal limits    EKG   Radiology MR LUMBAR SPINE WO CONTRAST  Result Date: 02/26/2023 CLINICAL DATA:  72 year old male with persistent right side low back pain for 3 days with no known injury. Prior surgery. EXAM: MRI LUMBAR SPINE WITHOUT CONTRAST TECHNIQUE: Multiplanar, multisequence MR imaging of the lumbar spine was performed. No intravenous contrast was administered. COMPARISON:  None Available. FINDINGS: Segmentation: Designating normal lumbar segmentation results in hypoplastic or absent ribs at T12. Correlation with radiographs is recommended prior to any operative intervention. Alignment: Preserved lower lumbar lordosis. Mild straightening of upper lumbar lordosis. Subtle dextroconvex lumbar scoliosis. No significant spondylolisthesis. Vertebrae: Mild hardware susceptibility artifact L3-L4 through L5-S1. Normal  background bone marrow signal. Small benign appearing medial right iliac bone lesion with intrinsic T1 signal on series 9, image 35. No marrow edema or evidence of acute osseous abnormality. Intact visible sacrum and SI joints. Conus medullaris and cauda equina: Conus extends to the T11-T12 level. No lower spinal cord or conus signal abnormality. Normal cauda equina nerve roots. Capacious spinal canal at most levels. Paraspinal and other soft tissues: Postoperative changes to the lumbar paraspinal soft tissues with no adverse features. Negative visible abdominal viscera. Disc levels: T10-T11: Negative disc.  Mild facet hypertrophy. T11-T12: Negative. T12-L1: Mild disc space loss and circumferential disc bulge. No stenosis. L1-L2: Mild disc space loss and circumferential disc bulge. Mild to moderate facet and ligament flavum hypertrophy. No stenosis. L2-L3: Circumferential disc bulge, up to moderate. And moderate facet and ligament flavum hypertrophy. Trace degenerative facet joint fluid. Mild epidural lipomatosis. Mild spinal stenosis (series 8, image 22). No convincing lateral recess stenosis. Up to mild L2 neural foraminal stenosis greater on the left. L3-L4: Interbody fusion hardware. Previous laminectomy. Residual mostly foraminal and far lateral disc osteophyte complex greater on the right. Moderate residual facet hypertrophy. No spinal or lateral recess stenosis. Mild to moderate right L3 neural foraminal stenosis. L4-L5: Previous decompression and fusion. Posterior and interbody  hardware. No convincing stenosis. L5-S1: Previous decompression and fusion. Posterior and interbody hardware. Possible interspinous spacer device also. No convincing stenosis. IMPRESSION: 1. Transitional anatomy, previous fusion as L3 through S1 results in absent or hypoplastic ribs at T12. Correlation with radiographs is recommended prior to any operative intervention. 2. Previous decompression and fusion L3-L4 through L5-S1. No  spinal stenosis at those levels. Residual right L3 foraminal stenosis due to foraminal and far lateral disc osteophyte complex there. 3. Adjacent segment disease at L2-L3 with mild multifactorial spinal stenosis and up to mild bilateral L2 neural foraminal stenosis. 4.  No acute osseous abnormality. Electronically Signed   By: Odessa Fleming M.D.   On: 02/26/2023 11:16    Procedures Procedures (including critical care time)  Medications Ordered in UC Medications  dexamethasone (DECADRON) injection 10 mg (10 mg Intramuscular Given 02/23/23 0914)    Initial Impression / Assessment and Plan / UC Course  I have reviewed the triage vital signs and the nursing notes.  Pertinent labs & imaging results that were available during my care of the patient were reviewed by me and considered in my medical decision making (see chart for details).     Urinalysis without evidence of urinary tract infection or hematuria suggesting a kidney stone.  Given the location and reproduction of some amount of his pain on exam, possibly musculoskeletal in nature.  Treat with IM Decadron, Zanaflex, heat, stretches, massage and follow-up for worsening symptoms.  No red flag findings today.  Final Clinical Impressions(s) / UC Diagnoses   Final diagnoses:  Strain of lumbar region, initial encounter   Discharge Instructions   None    ED Prescriptions     Medication Sig Dispense Auth. Provider   tizanidine (ZANAFLEX) 2 MG capsule Take 1 capsule (2 mg total) by mouth 3 (three) times daily as needed for muscle spasms. Do not drink alcohol or drive while taking this medication.  May cause drowsiness. 15 capsule Particia Nearing, [place]      PDMP not reviewed this encounter.   Particia Nearing, [place] 02/26/23 1246

## 2023-03-02 DIAGNOSIS — M545 Low back pain, unspecified: Secondary | ICD-10-CM | POA: Diagnosis not present

## 2023-03-02 DIAGNOSIS — M461 Sacroiliitis, not elsewhere classified: Secondary | ICD-10-CM | POA: Diagnosis not present

## 2023-03-02 DIAGNOSIS — M4326 Fusion of spine, lumbar region: Secondary | ICD-10-CM | POA: Diagnosis not present

## 2023-03-03 ENCOUNTER — Telehealth: Payer: Self-pay | Admitting: *Deleted

## 2023-03-03 NOTE — Telephone Encounter (Signed)
Transition Care Management Follow-up Telephone Call Date of discharge and from where: [facility]  How have you been since you were released from the hospital? Feeling awful and there is no stopping the pain with movement Any questions or concerns? No  Items Reviewed: Did the pt receive and understand the discharge instructions provided? Yes  Medications obtained and verified? No  Other? No  Any new allergies since your discharge? No  Dietary orders reviewed? No Do you have support at home? Yes     Follow up appointments reviewed:  PCP Hospital f/u appt confirmed? Yes  .Been to 2 emergency rooms and has also been to his back dr and has an appt with a specialist on the 21st has had an mri  Are transportation arrangements needed? No  If their condition worsens, is the pt aware to call PCP or go to the Emergency Dept.? Yes Was the patient provided with contact information for the PCP's office or ED? Yes Was to pt encouraged to call back with questions or concerns? Yes

## 2023-03-04 ENCOUNTER — Encounter (HOSPITAL_BASED_OUTPATIENT_CLINIC_OR_DEPARTMENT_OTHER): Payer: Self-pay

## 2023-03-04 ENCOUNTER — Emergency Department (HOSPITAL_COMMUNITY): Payer: Medicare HMO

## 2023-03-04 ENCOUNTER — Emergency Department (HOSPITAL_COMMUNITY)
Admission: EM | Admit: 2023-03-04 | Discharge: 2023-03-04 | Disposition: A | Discharge status: Home / Self Care | Payer: Medicare HMO | Attending: Emergency Medicine | Admitting: Emergency Medicine

## 2023-03-04 ENCOUNTER — Emergency Department (HOSPITAL_BASED_OUTPATIENT_CLINIC_OR_DEPARTMENT_OTHER)
Admission: EM | Admit: 2023-03-04 | Discharge: 2023-03-04 | Disposition: A | Discharge status: Home / Self Care | Payer: Medicare HMO | Source: Home / Self Care | Attending: Emergency Medicine | Admitting: Emergency Medicine

## 2023-03-04 ENCOUNTER — Other Ambulatory Visit: Payer: Self-pay

## 2023-03-04 ENCOUNTER — Encounter (HOSPITAL_COMMUNITY): Payer: Self-pay | Admitting: *Deleted

## 2023-03-04 DIAGNOSIS — I10 Essential (primary) hypertension: Secondary | ICD-10-CM | POA: Insufficient documentation

## 2023-03-04 DIAGNOSIS — M25551 Pain in right hip: Secondary | ICD-10-CM | POA: Insufficient documentation

## 2023-03-04 DIAGNOSIS — M160 Bilateral primary osteoarthritis of hip: Secondary | ICD-10-CM | POA: Diagnosis not present

## 2023-03-04 DIAGNOSIS — Z79899 Other long term (current) drug therapy: Secondary | ICD-10-CM | POA: Insufficient documentation

## 2023-03-04 DIAGNOSIS — Z7982 Long term (current) use of aspirin: Secondary | ICD-10-CM | POA: Diagnosis not present

## 2023-03-04 MED ORDER — CELECOXIB 200 MG PO CAPS: 200.0000 mg | ORAL_CAPSULE | Freq: Two times a day (BID) | ORAL | 0 refills | Status: DC

## 2023-03-04 MED ORDER — OXYCODONE HCL 5 MG PO TABS: 5.0000 mg | ORAL_TABLET | ORAL | 0 refills | Status: AC | PRN

## 2023-03-04 NOTE — Discharge Instructions (Addendum)
Typical multimodal therapy with Tylenol 1000 mg every 8 hours, Celebrex 200 mg twice daily, lidocaine patches as needed, oxycodone 5 to 10 mg every 6 hours as needed for breakthrough with plan to follow-up with orthopedics tomorrow and with his PCP next week   [email or web address] [phone]

## 2023-03-04 NOTE — ED Notes (Signed)
 RN reviewed discharge instructions with pt. Pt verbalized understanding and had no further questions. VSS upon discharge.  

## 2023-03-04 NOTE — ED Triage Notes (Addendum)
Pt with continued pain to right hip.  Pt has seen a PA at orthopedics and has had MRI done.

## 2023-03-04 NOTE — ED Provider Notes (Signed)
[place] EMERGENCY DEPARTMENT AT [facility] Provider Note   CSN: [id number] Arrival date & time: 03/04/23  1606     History Chief Complaint  Patient presents with   Hip Pain    HPI [name] is a 72 y.o. male presenting for chief complaint of acute on chronic hip pain.  Was seen at [facility] this morning referred to the orthopedic center here.  Incorrectly came to the emergency department.  Patient stated that immediately during my conversation that he did not realize this was an emergency department.    Patient's recorded medical, surgical, social, medication list and allergies were reviewed in the Snapshot window as part of the initial history.   Review of Systems   Review of Systems  Physical Exam Updated Vital Signs BP (!) 130/91 (BP Location: Right Arm)   Pulse 86   Temp (!) 96.4 F (35.8 C) (Temporal)   Resp 20   Ht 5\' 9"  (1.753 m)   Wt 97.5 kg   SpO2 100%   BMI 31.74 kg/m  Physical Exam   ED Course/ Medical Decision Making/ A&P    Procedures Procedures   Medications Ordered in ED Medications - No data to display  Medical Decision Making:    [name] is a 72 y.o. male who presented to the ED today with acute on chronic right hip pain detailed above.    Differential is broad.  Includes spinal pathology though he is already had a negative MRI, fracture that given negative x-ray this morning, renal pathology though he had negative basic labs recently .  Considered septic arthritis, however symptoms been going on for greater than 10 days and this is grossly inconsistent with the diagnosis especially in setting of lack of fever. States that he has been prescribed multiple medications already gone MRIs x-rays all of which from an emergency standpoint been nondiagnostic wants to follow-up in orthopedic. Informed patient that this is an emergency room and that the orthopedic center is already closed for the day. Arranged patient with further  pain control for overnight tonight as well as follow-up with orthopedics tomorrow.  Give him the contact information the website for the same-day appointment slots and recommended he call them to make an appointment as soon as possible. He denies fevers chills nausea vomiting syncope shortness of breath or any other concerning findings at this time.  He is comfortably sitting on his phone.  States his pain is only when he gets up to ambulate. Patient stable for outpatient care management without acute indication further intervention from an emergency standpoint.   Patient is being treated with polypharmacy at the moment.  Recently started on gabapentin, Zanaflex and narcotics.  None of these medications independently are providing pain control.  Will standardize patient's care to typical multimodal therapy with Tylenol 1000 mg every 8 hours, Celebrex 200 mg twice daily, oxycodone 5 to 10 mg every 6 hours as needed with plan to follow-up with orthopedics tomorrow and with his PCP next week.  May require transfer of care to pain control/pain management provider if requiring ongoing treatment with pain medications.  Clinical Impression:  1. Right hip pain      Discharge   Final Clinical Impression(s) / ED Diagnoses Final diagnoses:  Right hip pain    Rx / DC Orders ED Discharge Orders          Ordered    Ambulatory referral for Orthotics  Status:  Canceled        03/04/23  1802    oxyCODONE (ROXICODONE) 5 MG immediate release tablet  Every 4 hours PRN        03/04/23 1816              Glyn Ade, MD 03/04/23 1819

## 2023-03-04 NOTE — ED Triage Notes (Signed)
Patient here POV from Home.  Endorses Right Sided Hip pain present for 10 Days. States he has been seen multiple times in ED Setting and Orthopedics. States interventions have not been effective and that pain has increased substantially.   Seen in another ED today and discharged today. Prescribed Celebrex today but has not picked up prescription.   NAD Noted during Triage. A&Ox4. GCS 15. BIB Wheelchair.

## 2023-03-04 NOTE — ED Provider Notes (Signed)
[place] EMERGENCY DEPARTMENT AT [facility] Provider Note   CSN: [id number] Arrival date & time: 03/04/23  1237     History  Chief Complaint  Patient presents with   Hip Pain    [name] is a 72 y.o. malewith a hx of previous back surgery who presents emergency department with severe right hip pain.  States this has been going on since about the eighth of this month.  Patient states that he is fine at rest but as soon as he stands up he has severe pain localized to the area right over the greater trochanter superior and posterior to the greater trochanter.  He states it is worse with ambulation.  He states that it is so severe it takes his breath away and he is unable to tolerate going more than a few steps.  He denies any injuries.  He states he has had an extensive workup already including an outpatient R MRI of his lumbar spine which was negative for any significant finding.  He was seen in the ER for the same.  He states he has had "shots and everything else."  He states "nothing is working to ease this pain."  He is already taking oxycodone and gabapentin along with ibuprofen without any relief of symptoms.  He has a past medical history of tubular adenoma of the colon.   Past Medical History:  Diagnosis Date   Anemia    COUPLE OF YRS AGO- NO PROBLEMS SINCE   Complication of anesthesia    REMEMBERS FEELING REALLY COLD WAKING UP   GERD (gastroesophageal reflux disease)    RARE    Hyperlipidemia    Hypertension    Migraine    Pain    RIGHT SHOULDER - ROTATOR CUFF TEAR; denies further pain 08/23/2018.     Hip Pain       Home Medications Prior to Admission medications   Medication Sig Start Date End Date Taking? Authorizing Provider  acetaminophen (TYLENOL) 650 MG CR tablet Take 1,300 mg by mouth every 8 (eight) hours as needed for pain.    [provider]  aspirin EC 81 MG tablet Take 81 mg by mouth daily. Swallow whole.    [provider]  atorvastatin (LIPITOR) 20 MG tablet Take 20 mg by mouth daily with breakfast.     [provider]  fluticasone (FLONASE) 50 MCG/ACT nasal spray Place 1 spray into both nostrils daily.    [provider]  ibuprofen (ADVIL) 200 MG tablet Take 400 mg by mouth every 6 (six) hours as needed for moderate pain.    [provider]  lidocaine (LIDODERM) 5 % Place 1 patch onto the skin daily. Remove & Discard patch within 12 hours or as directed by MD 02/26/23   Wynetta Fines, MD  LORazepam (ATIVAN) 0.5 MG tablet Take 0.5 mg by mouth at bedtime as needed for sleep.    [provider]  Menthol, Topical Analgesic, (ICY HOT EX) Apply 1 Application topically daily as needed (pain).    [provider]  METAMUCIL FIBER PO Take 1 Scoop by mouth daily.    [provider]  Multiple Vitamins-Minerals (CENTRUM SILVER PO) Take 1 tablet by mouth daily.    [provider]  Polyethyl Glycol-Propyl Glycol (SYSTANE OP) Place 1 drop into both eyes daily.    [provider]  predniSONE (DELTASONE) 50 MG tablet Take 1 tablet (50 mg total) by mouth daily. 02/25/23   Salli Quarry  R, NP  RABEprazole (ACIPHEX) 20 MG tablet Take 1 tablet (20 mg total) by mouth daily. Patient not taking: Reported on 12/28/2022 11/23/22   Corbin Ade, MD  sennosides-docusate sodium (SENOKOT-S) 8.6-50 MG tablet Take 1-2 tablets by mouth daily as needed for constipation.    [provider]  SUMAtriptan (IMITREX) 100 MG tablet Take 100 mg by mouth every 2 (two) hours as needed for migraine.    [provider]  tizanidine (ZANAFLEX) 2 MG capsule Take 1 capsule (2 mg total) by mouth 3 (three) times daily as needed for muscle spasms. Do not drink alcohol or drive while taking this medication.  May cause drowsiness. 02/23/23   Particia Nearing, PA-C  valsartan (DIOVAN) 320 MG tablet Take 320 mg by mouth daily.    [provider]       Allergies    Bee venom and Codeine    Review of Systems   Review of Systems  Physical Exam Updated Vital Signs BP (!) 169/93   Pulse 68   Temp 98 F (36.7 C) (Oral)   Resp (!) 22   Ht 5\' 9"  (1.753 m)   Wt 97.5 kg   SpO2 100%   BMI 31.75 kg/m  Physical Exam Vitals and nursing note reviewed.  Constitutional:      General: He is not in acute distress.    Appearance: He is well-developed. He is not diaphoretic.  HENT:     Head: Normocephalic and atraumatic.  Eyes:     General: No scleral icterus.    Conjunctiva/sclera: Conjunctivae normal.  Cardiovascular:     Rate and Rhythm: Normal rate and regular rhythm.     Heart sounds: Normal heart sounds.  Pulmonary:     Effort: Pulmonary effort is normal. No respiratory distress.     Breath sounds: Normal breath sounds.  Abdominal:     Palpations: Abdomen is soft.     Tenderness: There is no abdominal tenderness.  Musculoskeletal:     Cervical back: Normal range of motion and neck supple.     Comments: No tenderness to palpation over the trochanter or to the surrounding structures of the anterior posterior hip.  No pain with range of motion of the hip.  No significant tenderness in the surrounding muscular structures of the lumbar spine or glutes.  Severe pain when standing.  DP PT pulse 2+ bilaterally.  Significant pain with range of motion when supported and lying down.  Skin:    General: Skin is warm and dry.  Neurological:     Mental Status: He is alert.  Psychiatric:        Behavior: Behavior normal.     ED Results / Procedures / Treatments   Labs (all labs ordered are listed, but only abnormal results are displayed) Labs Reviewed - No data to display  EKG None  Radiology No results found.  Procedures Procedures    Medications Ordered in ED Medications - No data to display  ED Course/ Medical Decision Making/ A&P Clinical Course as of 03/04/23 1447  Thu Mar 04, 2023  1446 DG Hip Unilat W or Wo Pelvis  2-3 Views Right [AH]    Clinical Course User Index [AH] Arthor Captain, PA-C                             Medical Decision Making This patient presents to the ED for concern of right hip pain, this involves an  extensive number of treatment options, and is a complaint that carries with it a high risk of complications and morbidity.  Given patient's history of adenoma and no imaging of the hip I wanted to rule out any abnormal findings such as pathologic fracture or metastatic disease.  I have low suspicion for bursitis as there is no significant tenderness around the hip joint.  Patient also has negative lumbar spine MRI on the 12th so I doubt that this is any type of radiculopathy.  Co morbidities:      hx of tubular adenoma   Social Determinants of Health:       SDOH Screenings Hx of dipping, no smoking hx   Additional history:  {Additional history obtained from emr   Lab Tests:  I Ordered, and personally interpreted labs.  The pertinent results include:  na   Imaging Studies:  I ordered imaging studies including Hip xray I independently visualized and interpreted imaging which showed mild arthritis bl, no pathologic frx or metastatic lesions I agree with the radiologist interpretation  Cardiac Monitoring/ECG:   Medicines ordered and prescription drug management:  I I have reviewed the patients home medicines and have made adjustments as needed  Test Considered:       CTA - doubt caudication  Critical Interventions:         Consultations Obtained:   Problem List / ED Course:       No diagnosis found.  MDM: Hip pain, unsure of etiology. Will refer to ortho       Amount and/or Complexity of Data Reviewed Radiology: ordered. Decision-making details documented in ED Course.  Risk Prescription drug management.           Final Clinical Impression(s) / ED Diagnoses Final diagnoses:  None    Rx / DC Orders ED Discharge Orders      None         Arthor Captain, PA-C 03/04/23 1453    Bethann Berkshire, MD 03/07/23 1719

## 2023-03-04 NOTE — Discharge Instructions (Signed)
For help with MyChart call [phone] Get help right away if: You fall. You have a sudden increase in pain and swelling in your hip. Your hip is red or swollen or very tender to touch.

## 2023-03-05 ENCOUNTER — Ambulatory Visit: Payer: Self-pay | Admitting: *Deleted

## 2023-03-05 NOTE — Telephone Encounter (Signed)
  Chief Complaint: hip pain-R Symptoms: aching pain- hard to walk Frequency: started 02/22/23   Disposition: [] ED /[x] Urgent Care (no appt availability in office) / [] Appointment(In office/virtual)/ []  [place] [facility]/ [] Home Care/ [] Refused Recommended Disposition /[] [place] [facility]/ []  Follow-up with PCP Additional Notes: Patient advised Ortho UC- he will go

## 2023-03-05 NOTE — Telephone Encounter (Signed)
Reason for Disposition . [1] SEVERE pain (e.g., excruciating, unable to do any normal activities) AND [2] not improved after 2 hours of pain medicine  Answer Assessment - Initial Assessment Questions 1. LOCATION and RADIATION: "Where is the pain located?"      Joint pain- R hip 2. QUALITY: "What does the pain feel like?"  (e.g., sharp, dull, aching, burning)     Aching pain 3. SEVERITY: "How bad is the pain?" "What does it keep you from doing?"   (Scale 1-10; or mild, moderate, severe)   -  MILD (1-3): doesn't interfere with normal activities    -  MODERATE (4-7): interferes with normal activities (e.g., work or school) or awakens from sleep, limping    -  SEVERE (8-10): excruciating pain, unable to do any normal activities, unable to walk     Moderate/severe- uncontrolled pain 4. ONSET: "When did the pain start?" "Does it come and go, or is it there all the time?"     7/8- started having pain 5. WORK OR EXERCISE: "Has there been any recent work or exercise that involved this part of the body?"      no 6. CAUSE: "What do you think is causing the hip pain?"      unsure 7. AGGRAVATING FACTORS: "What makes the hip pain worse?" (e.g., walking, climbing stairs, running)     walking 8. OTHER SYMPTOMS: "Do you have any other symptoms?" (e.g., back pain, pain shooting down leg,  fever, rash) Back pain  Protocols used: Hip Pain-A-AH

## 2023-03-09 DIAGNOSIS — M461 Sacroiliitis, not elsewhere classified: Secondary | ICD-10-CM | POA: Diagnosis not present

## 2023-03-23 DIAGNOSIS — M7060 Trochanteric bursitis, unspecified hip: Secondary | ICD-10-CM | POA: Diagnosis not present

## 2023-03-23 DIAGNOSIS — M545 Low back pain, unspecified: Secondary | ICD-10-CM | POA: Diagnosis not present

## 2023-03-23 DIAGNOSIS — D485 Neoplasm of uncertain behavior of skin: Secondary | ICD-10-CM | POA: Diagnosis not present

## 2023-04-15 DIAGNOSIS — E782 Mixed hyperlipidemia: Secondary | ICD-10-CM | POA: Diagnosis not present

## 2023-04-15 DIAGNOSIS — Z125 Encounter for screening for malignant neoplasm of prostate: Secondary | ICD-10-CM | POA: Diagnosis not present

## 2023-04-15 DIAGNOSIS — R7303 Prediabetes: Secondary | ICD-10-CM | POA: Diagnosis not present

## 2023-04-21 DIAGNOSIS — I10 Essential (primary) hypertension: Secondary | ICD-10-CM | POA: Diagnosis not present

## 2023-04-21 DIAGNOSIS — K5909 Other constipation: Secondary | ICD-10-CM | POA: Diagnosis not present

## 2023-04-21 DIAGNOSIS — I129 Hypertensive chronic kidney disease with stage 1 through stage 4 chronic kidney disease, or unspecified chronic kidney disease: Secondary | ICD-10-CM | POA: Diagnosis not present

## 2023-04-21 DIAGNOSIS — M160 Bilateral primary osteoarthritis of hip: Secondary | ICD-10-CM | POA: Diagnosis not present

## 2023-04-21 DIAGNOSIS — N182 Chronic kidney disease, stage 2 (mild): Secondary | ICD-10-CM | POA: Diagnosis not present

## 2023-04-21 DIAGNOSIS — Z23 Encounter for immunization: Secondary | ICD-10-CM | POA: Diagnosis not present

## 2023-04-21 DIAGNOSIS — K219 Gastro-esophageal reflux disease without esophagitis: Secondary | ICD-10-CM | POA: Diagnosis not present

## 2023-04-21 DIAGNOSIS — F411 Generalized anxiety disorder: Secondary | ICD-10-CM | POA: Diagnosis not present

## 2023-04-21 DIAGNOSIS — M545 Low back pain, unspecified: Secondary | ICD-10-CM | POA: Diagnosis not present

## 2023-04-21 DIAGNOSIS — E782 Mixed hyperlipidemia: Secondary | ICD-10-CM | POA: Diagnosis not present

## 2023-04-21 DIAGNOSIS — F5105 Insomnia due to other mental disorder: Secondary | ICD-10-CM | POA: Diagnosis not present

## 2023-04-21 DIAGNOSIS — R7303 Prediabetes: Secondary | ICD-10-CM | POA: Diagnosis not present

## 2023-05-20 DIAGNOSIS — K5900 Constipation, unspecified: Secondary | ICD-10-CM | POA: Diagnosis not present

## 2023-05-20 DIAGNOSIS — F411 Generalized anxiety disorder: Secondary | ICD-10-CM | POA: Diagnosis not present

## 2023-05-20 DIAGNOSIS — J309 Allergic rhinitis, unspecified: Secondary | ICD-10-CM | POA: Diagnosis not present

## 2023-05-20 DIAGNOSIS — M4800 Spinal stenosis, site unspecified: Secondary | ICD-10-CM | POA: Diagnosis not present

## 2023-05-20 DIAGNOSIS — M5440 Lumbago with sciatica, unspecified side: Secondary | ICD-10-CM | POA: Diagnosis not present

## 2023-05-20 DIAGNOSIS — Z008 Encounter for other general examination: Secondary | ICD-10-CM | POA: Diagnosis not present

## 2023-05-20 DIAGNOSIS — E669 Obesity, unspecified: Secondary | ICD-10-CM | POA: Diagnosis not present

## 2023-05-20 DIAGNOSIS — I129 Hypertensive chronic kidney disease with stage 1 through stage 4 chronic kidney disease, or unspecified chronic kidney disease: Secondary | ICD-10-CM | POA: Diagnosis not present

## 2023-05-20 DIAGNOSIS — G4700 Insomnia, unspecified: Secondary | ICD-10-CM | POA: Diagnosis not present

## 2023-05-20 DIAGNOSIS — K219 Gastro-esophageal reflux disease without esophagitis: Secondary | ICD-10-CM | POA: Diagnosis not present

## 2023-05-20 DIAGNOSIS — M1990 Unspecified osteoarthritis, unspecified site: Secondary | ICD-10-CM | POA: Diagnosis not present

## 2023-05-20 DIAGNOSIS — E785 Hyperlipidemia, unspecified: Secondary | ICD-10-CM | POA: Diagnosis not present

## 2023-05-20 DIAGNOSIS — Z79899 Other long term (current) drug therapy: Secondary | ICD-10-CM | POA: Diagnosis not present

## 2023-06-02 DIAGNOSIS — Z6830 Body mass index (BMI) 30.0-30.9, adult: Secondary | ICD-10-CM | POA: Diagnosis not present

## 2023-06-02 DIAGNOSIS — Z79899 Other long term (current) drug therapy: Secondary | ICD-10-CM | POA: Diagnosis not present

## 2023-06-02 DIAGNOSIS — I10 Essential (primary) hypertension: Secondary | ICD-10-CM | POA: Diagnosis not present

## 2023-06-02 DIAGNOSIS — F5105 Insomnia due to other mental disorder: Secondary | ICD-10-CM | POA: Diagnosis not present

## 2023-06-02 DIAGNOSIS — E669 Obesity, unspecified: Secondary | ICD-10-CM | POA: Diagnosis not present

## 2023-06-02 DIAGNOSIS — G8929 Other chronic pain: Secondary | ICD-10-CM | POA: Diagnosis not present

## 2023-06-02 DIAGNOSIS — M545 Low back pain, unspecified: Secondary | ICD-10-CM | POA: Diagnosis not present

## 2023-06-02 DIAGNOSIS — F411 Generalized anxiety disorder: Secondary | ICD-10-CM | POA: Diagnosis not present

## 2023-06-02 DIAGNOSIS — Z713 Dietary counseling and surveillance: Secondary | ICD-10-CM | POA: Diagnosis not present

## 2023-06-02 DIAGNOSIS — M160 Bilateral primary osteoarthritis of hip: Secondary | ICD-10-CM | POA: Diagnosis not present

## 2023-06-02 DIAGNOSIS — Z7182 Exercise counseling: Secondary | ICD-10-CM | POA: Diagnosis not present

## 2023-06-02 DIAGNOSIS — K219 Gastro-esophageal reflux disease without esophagitis: Secondary | ICD-10-CM | POA: Diagnosis not present

## 2023-06-16 DIAGNOSIS — M79674 Pain in right toe(s): Secondary | ICD-10-CM | POA: Diagnosis not present

## 2023-06-16 DIAGNOSIS — M7741 Metatarsalgia, right foot: Secondary | ICD-10-CM | POA: Diagnosis not present

## 2023-07-20 DIAGNOSIS — M25552 Pain in left hip: Secondary | ICD-10-CM | POA: Diagnosis not present

## 2023-07-20 DIAGNOSIS — M7062 Trochanteric bursitis, left hip: Secondary | ICD-10-CM | POA: Diagnosis not present

## 2023-07-27 DIAGNOSIS — L820 Inflamed seborrheic keratosis: Secondary | ICD-10-CM | POA: Diagnosis not present

## 2023-07-27 DIAGNOSIS — Z1283 Encounter for screening for malignant neoplasm of skin: Secondary | ICD-10-CM | POA: Diagnosis not present

## 2023-07-27 DIAGNOSIS — T148XXA Other injury of unspecified body region, initial encounter: Secondary | ICD-10-CM | POA: Diagnosis not present

## 2023-07-27 DIAGNOSIS — D225 Melanocytic nevi of trunk: Secondary | ICD-10-CM | POA: Diagnosis not present

## 2023-08-11 ENCOUNTER — Emergency Department (HOSPITAL_COMMUNITY): Payer: Medicare HMO

## 2023-08-11 ENCOUNTER — Emergency Department (HOSPITAL_COMMUNITY)
Admission: EM | Admit: 2023-08-11 | Discharge: 2023-08-11 | Disposition: A | Discharge status: Home / Self Care | Payer: Medicare HMO | Attending: Emergency Medicine | Admitting: Emergency Medicine

## 2023-08-11 ENCOUNTER — Encounter (HOSPITAL_COMMUNITY): Payer: Self-pay

## 2023-08-11 ENCOUNTER — Other Ambulatory Visit: Payer: Self-pay

## 2023-08-11 DIAGNOSIS — I700 Atherosclerosis of aorta: Secondary | ICD-10-CM | POA: Insufficient documentation

## 2023-08-11 DIAGNOSIS — Z7982 Long term (current) use of aspirin: Secondary | ICD-10-CM | POA: Diagnosis not present

## 2023-08-11 DIAGNOSIS — K5730 Diverticulosis of large intestine without perforation or abscess without bleeding: Secondary | ICD-10-CM | POA: Insufficient documentation

## 2023-08-11 DIAGNOSIS — M25511 Pain in right shoulder: Secondary | ICD-10-CM | POA: Diagnosis not present

## 2023-08-11 DIAGNOSIS — M25571 Pain in right ankle and joints of right foot: Secondary | ICD-10-CM | POA: Diagnosis not present

## 2023-08-11 DIAGNOSIS — S8991XA Unspecified injury of right lower leg, initial encounter: Secondary | ICD-10-CM | POA: Diagnosis not present

## 2023-08-11 DIAGNOSIS — M25561 Pain in right knee: Secondary | ICD-10-CM | POA: Diagnosis not present

## 2023-08-11 DIAGNOSIS — R911 Solitary pulmonary nodule: Secondary | ICD-10-CM

## 2023-08-11 DIAGNOSIS — S0990XA Unspecified injury of head, initial encounter: Secondary | ICD-10-CM | POA: Insufficient documentation

## 2023-08-11 DIAGNOSIS — N400 Enlarged prostate without lower urinary tract symptoms: Secondary | ICD-10-CM | POA: Insufficient documentation

## 2023-08-11 DIAGNOSIS — R918 Other nonspecific abnormal finding of lung field: Secondary | ICD-10-CM | POA: Diagnosis not present

## 2023-08-11 DIAGNOSIS — M19071 Primary osteoarthritis, right ankle and foot: Secondary | ICD-10-CM | POA: Diagnosis not present

## 2023-08-11 DIAGNOSIS — S199XXA Unspecified injury of neck, initial encounter: Secondary | ICD-10-CM | POA: Diagnosis not present

## 2023-08-11 DIAGNOSIS — I10 Essential (primary) hypertension: Secondary | ICD-10-CM | POA: Diagnosis not present

## 2023-08-11 LAB — CBC WITH DIFFERENTIAL/PLATELET
Abs Immature Granulocytes: 0.04 10*3/uL (ref 0.00–0.07)
Basophils Absolute: 0 10*3/uL (ref 0.0–0.1)
Basophils Relative: 1 %
Eosinophils Absolute: 0.1 10*3/uL (ref 0.0–0.5)
Eosinophils Relative: 2 %
HCT: 38.3 % — ABNORMAL LOW (ref 39.0–52.0)
Hemoglobin: 13.2 g/dL (ref 13.0–17.0)
Immature Granulocytes: 1 %
Lymphocytes Relative: 27 %
Lymphs Abs: 1.7 10*3/uL (ref 0.7–4.0)
MCH: 32 pg (ref 26.0–34.0)
MCHC: 34.5 g/dL (ref 30.0–36.0)
MCV: 92.7 fL (ref 80.0–100.0)
Monocytes Absolute: 0.7 10*3/uL (ref 0.1–1.0)
Monocytes Relative: 12 %
Neutro Abs: 3.7 10*3/uL (ref 1.7–7.7)
Neutrophils Relative %: 57 %
Platelets: 237 10*3/uL (ref 150–400)
RBC: 4.13 MIL/uL — ABNORMAL LOW (ref 4.22–5.81)
RDW: 12.6 % (ref 11.5–15.5)
WBC: 6.4 10*3/uL (ref 4.0–10.5)
nRBC: 0 % (ref 0.0–0.2)

## 2023-08-11 LAB — COMPREHENSIVE METABOLIC PANEL
ALT: 30 U/L (ref 0–44)
AST: 24 U/L (ref 15–41)
Albumin: 3.8 g/dL (ref 3.5–5.0)
Alkaline Phosphatase: 65 U/L (ref 38–126)
Anion gap: 8 (ref 5–15)
BUN: 19 mg/dL (ref 8–23)
CO2: 25 mmol/L (ref 22–32)
Calcium: 8.6 mg/dL — ABNORMAL LOW (ref 8.9–10.3)
Chloride: 105 mmol/L (ref 98–111)
Creatinine, Ser: 1.16 mg/dL (ref 0.61–1.24)
GFR, Estimated: >60 mL/min (ref >60)
Glucose, Bld: 110 mg/dL — ABNORMAL HIGH (ref 70–99)
Potassium: 3.7 mmol/L (ref 3.5–5.1)
Sodium: 138 mmol/L (ref 135–145)
Total Bilirubin: 0.6 mg/dL (ref <1.2)
Total Protein: 6.4 g/dL — ABNORMAL LOW (ref 6.5–8.1)

## 2023-08-11 MED ORDER — ACETAMINOPHEN 325 MG PO TABS
650.0000 mg | ORAL_TABLET | Freq: Once | ORAL | Status: AC
  Administered 2023-08-11: 650 mg via ORAL
  Filled 2023-08-11: qty 2

## 2023-08-11 MED ORDER — IOHEXOL 300 MG/ML  SOLN
100.0000 mL | Freq: Once | INTRAMUSCULAR | Status: AC | PRN
  Administered 2023-08-11: 100 mL via INTRAVENOUS

## 2023-08-11 MED ORDER — IBUPROFEN 400 MG PO TABS
600.0000 mg | ORAL_TABLET | Freq: Once | ORAL | Status: AC
  Administered 2023-08-11: 600 mg via ORAL
  Filled 2023-08-11: qty 2

## 2023-08-11 MED ORDER — IBUPROFEN 600 MG PO TABS: 600.0000 mg | ORAL_TABLET | Freq: Four times a day (QID) | ORAL | 0 refills | Status: DC | PRN
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
## 2023-08-11 NOTE — ED Notes (Signed)
Pt refuses boot and crutches states "I have multiple at home."
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
## 2023-08-19 DIAGNOSIS — M7741 Metatarsalgia, right foot: Secondary | ICD-10-CM | POA: Diagnosis not present

## 2023-09-16 ENCOUNTER — Telehealth (INDEPENDENT_AMBULATORY_CARE_PROVIDER_SITE_OTHER): Payer: Self-pay | Admitting: Otolaryngology

## 2023-09-16 NOTE — Telephone Encounter (Signed)
Patient LVM 09/15/23 stating he wanted to schedule an appt for throat issues.  He is a former patient of Dr. Avel Sensor, last seen Nov 2021; LVM msg informing patient we would need a referral to our office and Dr. Suszanne Conners no longer treats throat issues.

## 2023-10-01 ENCOUNTER — Other Ambulatory Visit: Payer: Self-pay | Admitting: *Deleted

## 2023-10-01 ENCOUNTER — Ambulatory Visit (INDEPENDENT_AMBULATORY_CARE_PROVIDER_SITE_OTHER): Payer: Medicare (Managed Care) | Admitting: Internal Medicine

## 2023-10-01 ENCOUNTER — Encounter: Payer: Self-pay | Admitting: Internal Medicine

## 2023-10-01 VITALS — BP 135/94 | HR 86 | Temp 98.2°F | Ht 69.0 in | Wt 212.0 lb

## 2023-10-01 DIAGNOSIS — K219 Gastro-esophageal reflux disease without esophagitis: Secondary | ICD-10-CM

## 2023-10-01 DIAGNOSIS — R142 Eructation: Secondary | ICD-10-CM | POA: Diagnosis not present

## 2023-10-01 DIAGNOSIS — K5909 Other constipation: Secondary | ICD-10-CM

## 2023-10-01 DIAGNOSIS — R09A2 Foreign body sensation, throat: Secondary | ICD-10-CM

## 2023-10-01 MED ORDER — BACLOFEN 10 MG PO TABS
5.0000 mg | ORAL_TABLET | Freq: Two times a day (BID) | ORAL | 0 refills | Status: DC
Start: 2023-10-01 — End: 2024-01-03

## 2023-10-01 NOTE — Progress Notes (Signed)
Primary Care Physician:  Benita Stabile, MD Primary Gastroenterologist:  Dr.   Pre-Procedure History & Physical: HPI:  [name] is a 73 y.o. male here for burping and belching.  Incessant now for over a year.  Extensive evaluation as outlined below.  Combination of Metamucil and MiraLAX for bowel function.  He alternates.  Typical reflux well-controlled.  He also now complains of a lump in his throat trying to get back to see Dr. Christain Sacramento -  longstanding oral tobacco use.  Update on evaluation.  Further evaluation recommended over at [facility].  Patient not interested in going in that direction.  I asked Dr. Marletta Lor to review this chart to see if he had any other thoughts on management of incessant belching.  Dr. Marletta Lor was kind enough to thoughtfully review the chart yesterday; he felt patient has had a thorough evaluation and had no further recommendations.   At this point, we know patient has GERD as evidenced by recent pH study off acid suppression therapy.  He also has very meager esophageal body motility.  It is possible he is air trapping when he is swallowing.  However, he had no evidence of rumination/regurgitation.  Previously he was was briefly on Dexilant 60 mg daily and felt this did help his belching to some degree.  He could not afford it.  Currently on Protonix.   It is possible patient may have some gas/bloat even known diagnosis of SIBO as contributing to his symptoms.   Previous interview failed to reveal any unusual dietary habits.   At this point, I feel it is worthwhile trying a FODMAP diet for 4 to 6 weeks.  Stopping Protonix beginning esomeprazole 40 mg twice daily for the same period.   Office follow-up in 4 to 6 weeks.  At that point if no improvement would consider empiric course of Xifaxan versus breath testing.   I called patient today.  Lengthy conversation about workup thus far and options for treatment moving forward.   He notes he took prior to  Nexium previously and was switched generics it did not and did not control his heartburn nearly as well.   He states he changed insurance recently.  First of the week, we will determine what PPIs are options for good coverage with his insurance carrier.   We will move to twice daily therapy for 6 weeks also we will send him a FODMAP diet the first of the week.   Discussed reasoning behind the this approach.  His questions were answered.  He is agreeable.            Electronically signed by Corbin Ade, MD at 11/21/2022 11:41 AM   Past Surgical History:  Procedure Laterality Date   BACK SURGERY  2006   LOWER BACK FUSION - SCREWS AND RODS   BACK SURGERY  06/2017   BIOPSY  12/24/2021   Procedure: BIOPSY;  Surgeon: Corbin Ade, MD;  [facility];  Service: Endoscopy;;   BUNIONECTOMY WITH WEIL OSTEOTOMY Right 04/11/2020   Procedure: Right scarf and modified McBride bunionectomy, second and third metatarsal phalangeal weil osteotomy, third hammertoe correction with extensor tendon lengthening;  Surgeon: Toni Arthurs, MD;  Location: [facility];  Service: Orthopedics;  Laterality: Right;  90 mins [facility], choice   CATARACT EXTRACTION W/PHACO Right 11/26/2015   Procedure: CATARACT EXTRACTION PHACO AND INTRAOCULAR LENS PLACEMENT (IOC);  Surgeon: Jethro Bolus, MD;  Location: [facility];  Service: Ophthalmology;  Laterality:  Right;  CDE:6.97   CATARACT EXTRACTION W/PHACO Left 12/10/2015   Procedure: CATARACT EXTRACTION PHACO AND INTRAOCULAR LENS PLACEMENT (IOC);  Surgeon: Jethro Bolus, MD;  Location: [facility];  Service: Ophthalmology;  Laterality: Left;  CDE:4.25   CHOLECYSTECTOMY     COLONOSCOPY WITH PROPOFOL N/A 12/02/2017   Procedure: COLONOSCOPY WITH PROPOFOL;  Surgeon: Corbin Ade, MD;  [facility];  Service: Endoscopy;  Laterality: N/A;  8:30am   COLONOSCOPY WITH PROPOFOL N/A 12/30/2022   Procedure: COLONOSCOPY WITH PROPOFOL;  Surgeon: Corbin Ade,  MD;  [facility];  Service: Endoscopy;  Laterality: N/A;  915am, asa 2   ESOPHAGEAL MANOMETRY N/A 11/04/2022   Procedure: ESOPHAGEAL MANOMETRY (EM);  Surgeon: Napoleon Form, MD;  Location: [facility];  Service: Gastroenterology;  Laterality: N/A;   ESOPHAGOGASTRODUODENOSCOPY (EGD) WITH PROPOFOL N/A 12/24/2021   Procedure: ESOPHAGOGASTRODUODENOSCOPY (EGD) WITH PROPOFOL;  Surgeon: Corbin Ade, MD;  [facility];  Service: Endoscopy;  Laterality: N/A;  3:00pm   ETHMOIDECTOMY Bilateral 08/15/2019   Procedure: ETHMOIDECTOMY;  Surgeon: Newman Pies, MD;  Location: [facility];  Service: ENT;  Laterality: Bilateral;   FINGERNAIL REMOVED RT INDEX FINGER - FOR CYST THAT WAS BEHIND THE NAIL     FRONTAL SINUS EXPLORATION Bilateral 08/15/2019   Procedure: FRONTAL SINUS EXPLORATION;  Surgeon: Newman Pies, MD;  Location: [facility];  Service: ENT;  Laterality: Bilateral;   left thumb surgery Left 06/2018   Dr. Hartford Poli SPINE SURGERY  08/11/2017   disc repair, Dr. Ned Card   MAXILLARY ANTROSTOMY Bilateral 08/15/2019   Procedure: MAXILLARY ANTROSTOMY WITH TISSUE REMOVAL;  Surgeon: Newman Pies, MD;  Location: [facility];  Service: ENT;  Laterality: Bilateral;   PH IMPEDANCE STUDY N/A 11/04/2022   Procedure: PH IMPEDANCE STUDY;  Surgeon: Napoleon Form, MD;  [facility];  Service: Gastroenterology;  Laterality: N/A;   SHOULDER OPEN ROTATOR CUFF REPAIR Right 09/01/2013   Procedure: RIGHT SHOULDER MINI OPEN ROTATOR CUFF REPAIR WITH SUBACROMIAL DECOMPRESSION;  Surgeon: Javier Docker, MD;  Location: [facility];  Service: Orthopedics;  Laterality: Right;   SINUS ENDO WITH FUSION Bilateral 08/15/2019   Procedure: SINUS ENDO WITH FUSION;  Surgeon: Newman Pies, MD;  Location: [facility];  Service: ENT;  Laterality: Bilateral;   SPHENOIDECTOMY Bilateral 08/15/2019   Procedure: SPHENOIDECTOMY;  Surgeon: Newman Pies, MD;   Location: [facility];  Service: ENT;  Laterality: Bilateral;   TONSILLECTOMY     SURGERY AS A CHILD   TURBINATE REDUCTION Bilateral 08/15/2019   Procedure: TURBINATE RECESSION;  Surgeon: Newman Pies, MD;  Location: [facility];  Service: ENT;  Laterality: Bilateral;    Prior to Admission medications   Medication Sig Start Date End Date Taking? Authorizing Provider  acetaminophen (TYLENOL) 650 MG CR tablet Take 1,300 mg by mouth every 8 (eight) hours as needed for pain.   Yes [provider]  aspirin EC 81 MG tablet Take 81 mg by mouth daily. Swallow whole.   Yes [provider]  atorvastatin (LIPITOR) 20 MG tablet Take 20 mg by mouth daily with breakfast.    Yes [provider]  celecoxib (CELEBREX) 200 MG capsule Take 1 capsule (200 mg total) by mouth 2 (two) times daily. 03/04/23  Yes Harris, Abigail, PA-C  fluticasone (FLONASE) 50 MCG/ACT nasal spray Place 1 spray into both nostrils daily.   Yes [provider]  ibuprofen (ADVIL) 600 MG  tablet Take 1 tablet (600 mg total) by mouth every 6 (six) hours as needed. 08/11/23  Yes Nanavati, Ankit, MD  lidocaine (LIDODERM) 5 % Place 1 patch onto the skin daily. Remove & Discard patch within 12 hours or as directed by MD 02/26/23  Yes Wynetta Fines, MD  LORazepam (ATIVAN) 0.5 MG tablet Take 0.5 mg by mouth at bedtime as needed for sleep.   Yes [provider]  Menthol, Topical Analgesic, (ICY HOT EX) Apply 1 Application topically daily as needed (pain).   Yes [provider]  METAMUCIL FIBER PO Take 1 Scoop by mouth daily.   Yes [provider]  Multiple Vitamins-Minerals (CENTRUM SILVER PO) Take 1 tablet by mouth daily.   Yes [provider]  Polyethyl Glycol-Propyl Glycol (SYSTANE OP) Place 1 drop into both eyes daily.   Yes [provider]  RABEprazole (ACIPHEX) 20 MG tablet Take 1 tablet (20 mg total) by mouth daily. 11/23/22  Yes Henrietta Cieslewicz,  Gerrit Friends, MD  sennosides-docusate sodium (SENOKOT-S) 8.6-50 MG tablet Take 1-2 tablets by mouth daily as needed for constipation.   Yes [provider]  SUMAtriptan (IMITREX) 100 MG tablet Take 100 mg by mouth every 2 (two) hours as needed for migraine.   Yes [provider]  valsartan (DIOVAN) 320 MG tablet Take 320 mg by mouth daily.   Yes [provider]  predniSONE (DELTASONE) 50 MG tablet Take 1 tablet (50 mg total) by mouth daily. Patient not taking: Reported on 10/01/2023 02/25/23   Valinda Hoar, NP    Allergies as of 10/01/2023 - Review Complete 10/01/2023  Allergen Reaction Noted   Bee venom Itching 03/06/2016   Codeine Itching 04/13/2013    Family History  Problem Relation Age of Onset   Hypertension Mother    Kidney failure Mother    Diabetes Mother    High blood pressure Father    High blood pressure Sister    Cancer Brother        brain tumor   High blood pressure Brother    Other Other    High blood pressure Brother    Migraines Daughter    Migraines Grandson    Migraines Granddaughter    Migraines Granddaughter    Colon cancer Neg Hx     Social History   Socioeconomic History   Marital status: Married    Spouse name: Not on file   Number of children: 3   Years of education: 13   Highest education level: High school graduate  Occupational History   Not on file  Tobacco Use   Smoking status: Never   Smokeless tobacco: Former    Types: Snuff    Quit date: 10/2015  Vaping Use   Vaping status: Never Used  Substance and Sexual Activity   Alcohol use: Yes    Comment: 1 beer a week or less   Drug use: No   Sexual activity: Yes    Birth control/protection: None  Other Topics Concern   Not on file  Social History Narrative   Lives at home with his wife Angle Karel   Right handed   Caffeine: 2 cups daily, coffee. Maybe 1 soda a day.   Social Drivers of Corporate investment banker Strain: Not on file  Food Insecurity:  Not on file  Transportation Needs: Not on file  Physical Activity: Not on file  Stress: Not on file  Social Connections: Not on file  Intimate Partner Violence: Not on  file    Review of Systems: See HPI, otherwise negative ROS  Physical Exam: BP (!) 135/94   Pulse 86   Temp 98.2 F (36.8 C)   Ht 5\' 9"  (1.753 m)   Wt 212 lb (96.2 kg)   BMI 31.31 kg/m  General:   Alert,  Well-developed, well-nourished, pleasant and cooperative in NAD Lungs:  Clear throughout to auscultation.   No wheezes, crackles, or rhonchi. No acute distress. Heart:  Regular rate and rhythm; no murmurs, clicks, rubs,  or gallops. Abdomen: Non-distended, normal bowel sounds.  Soft and nontender without appreciable mass or hepatosplenomegaly.   Impression/Plan: 73 year old with  aggravating symptoms of belching /burping extensive workup to date as outlined above.  He may have decreased gastric accommodation to account for his symptoms.   It is also possible this may be learned behavior.    Will screen him for disaccharidase deficiency.   Avoid Metamucil use MiraLAX daily.  Continue PPI  2 Week  trial of baclofen.  Discussed the risk and benefits  Telephone follow-up in 2 weeks  Appointment in 2 months.    Notice: This dictation was prepared with Dragon dictation along with smaller phrase technology. Any transcriptional errors that result from this process are unintentional and may not be corrected upon review.

## 2023-10-01 NOTE — Patient Instructions (Signed)
Good to see you again today!  Stop Metamucil altogether  Use MiraLAX daily as needed for bowel function  Continue rabeprazole 20 mg daily for reflux  Complete C13 SBT breath testing to check whether or not you are digesting sugars adequately  We will make a referral to Dr. Christain Sacramento  Trial of baclofen 5 mg orally twice daily to see if we can get your stomach to relax to decrease burping/belching.  Dispense 28.  Take 1 twice daily for 14 days.  No refills.  Call me when you run out of baclofen and let me know how you did with that medication  Plan to see you back in the office in 8 weeks

## 2023-10-04 ENCOUNTER — Telehealth (INDEPENDENT_AMBULATORY_CARE_PROVIDER_SITE_OTHER): Payer: Self-pay | Admitting: Otolaryngology

## 2023-10-04 NOTE — Telephone Encounter (Signed)
LVM for Martie Lee, Referral Coordinator for Dr. Nita Sells in [place], [place], to let her know that we already received a referral for this patient from another provider for this diagnosis.  Dr. Kathleene Hazel. Margo Aye, MD office info: [address] [place], [place], [place] [address]; [phone].

## 2023-10-19 DIAGNOSIS — I10 Essential (primary) hypertension: Secondary | ICD-10-CM | POA: Diagnosis not present

## 2023-10-19 DIAGNOSIS — K219 Gastro-esophageal reflux disease without esophagitis: Secondary | ICD-10-CM | POA: Diagnosis not present

## 2023-10-19 DIAGNOSIS — J329 Chronic sinusitis, unspecified: Secondary | ICD-10-CM | POA: Diagnosis not present

## 2023-10-19 DIAGNOSIS — I700 Atherosclerosis of aorta: Secondary | ICD-10-CM | POA: Diagnosis not present

## 2023-10-19 DIAGNOSIS — M160 Bilateral primary osteoarthritis of hip: Secondary | ICD-10-CM | POA: Diagnosis not present

## 2023-10-19 DIAGNOSIS — M545 Low back pain, unspecified: Secondary | ICD-10-CM | POA: Diagnosis not present

## 2023-10-19 DIAGNOSIS — N182 Chronic kidney disease, stage 2 (mild): Secondary | ICD-10-CM | POA: Diagnosis not present

## 2023-10-19 DIAGNOSIS — I129 Hypertensive chronic kidney disease with stage 1 through stage 4 chronic kidney disease, or unspecified chronic kidney disease: Secondary | ICD-10-CM | POA: Diagnosis not present

## 2023-10-19 DIAGNOSIS — R918 Other nonspecific abnormal finding of lung field: Secondary | ICD-10-CM | POA: Diagnosis not present

## 2023-10-19 DIAGNOSIS — T1490XD Injury, unspecified, subsequent encounter: Secondary | ICD-10-CM | POA: Diagnosis not present

## 2023-10-19 DIAGNOSIS — Z0001 Encounter for general adult medical examination with abnormal findings: Secondary | ICD-10-CM | POA: Diagnosis not present

## 2023-10-19 DIAGNOSIS — F411 Generalized anxiety disorder: Secondary | ICD-10-CM | POA: Diagnosis not present

## 2023-10-25 ENCOUNTER — Telehealth: Payer: Self-pay | Admitting: Internal Medicine

## 2023-10-25 ENCOUNTER — Telehealth: Payer: Self-pay

## 2023-10-25 NOTE — Telephone Encounter (Signed)
 Have not heard back from the patient regarding effectiveness of baclofen.  Also, he has a sucrose digestion deficiency at 1.56%.  Might be worth trying Sucraid.  Lets see what benefit his insurance company may provide.  Lets also first find out how he did with baclofen.  Thanks.

## 2023-10-25 NOTE — Telephone Encounter (Signed)
 Lmom for pt to return call

## 2023-10-25 NOTE — Telephone Encounter (Signed)
 Pt was made aware and verbalized understanding. Waiting on signed form to send to the company.

## 2023-10-25 NOTE — Telephone Encounter (Signed)
 Spoke with pt and he states that the baclofen does not work. Pt is willing to try the 4 day free trial of sucraid to see if that helps. Form has been filled out and is on your desk waiting for signature.

## 2023-10-25 NOTE — Telephone Encounter (Signed)
 Breath test report is in chart under media for review.

## 2023-10-26 NOTE — Telephone Encounter (Signed)
 Form was faxed to the company.

## 2023-11-11 DIAGNOSIS — M79641 Pain in right hand: Secondary | ICD-10-CM | POA: Diagnosis not present

## 2023-11-11 DIAGNOSIS — M79642 Pain in left hand: Secondary | ICD-10-CM | POA: Diagnosis not present

## 2023-11-16 ENCOUNTER — Telehealth: Payer: Self-pay

## 2023-11-16 NOTE — Telephone Encounter (Signed)
 Pt did the four day trial of the sucraid. States that he seen a decrease in his belching but was discouraged that it didn't take it away completely. Pt does not want an Rx of Sucraid moving forward.

## 2023-11-17 ENCOUNTER — Encounter: Payer: Self-pay | Admitting: Internal Medicine

## 2023-11-22 ENCOUNTER — Ambulatory Visit (INDEPENDENT_AMBULATORY_CARE_PROVIDER_SITE_OTHER): Payer: Medicare (Managed Care) | Admitting: Otolaryngology

## 2023-11-22 ENCOUNTER — Encounter (INDEPENDENT_AMBULATORY_CARE_PROVIDER_SITE_OTHER): Payer: Self-pay | Admitting: Otolaryngology

## 2023-11-22 VITALS — BP 157/84 | HR 74 | Ht 69.0 in | Wt 212.0 lb

## 2023-11-22 DIAGNOSIS — R0981 Nasal congestion: Secondary | ICD-10-CM

## 2023-11-22 DIAGNOSIS — J3089 Other allergic rhinitis: Secondary | ICD-10-CM | POA: Diagnosis not present

## 2023-11-22 DIAGNOSIS — R0982 Postnasal drip: Secondary | ICD-10-CM | POA: Diagnosis not present

## 2023-11-22 DIAGNOSIS — K219 Gastro-esophageal reflux disease without esophagitis: Secondary | ICD-10-CM

## 2023-11-22 DIAGNOSIS — R490 Dysphonia: Secondary | ICD-10-CM | POA: Diagnosis not present

## 2023-11-22 DIAGNOSIS — R09A2 Foreign body sensation, throat: Secondary | ICD-10-CM | POA: Diagnosis not present

## 2023-11-22 DIAGNOSIS — J343 Hypertrophy of nasal turbinates: Secondary | ICD-10-CM

## 2023-11-22 MED ORDER — FLUTICASONE PROPIONATE 50 MCG/ACT NA SUSP: 2.0000 | Freq: Every day | NASAL | 6 refills | Status: AC

## 2023-11-22 MED ORDER — CETIRIZINE HCL 10 MG PO TABS: 10.0000 mg | ORAL_TABLET | Freq: Every day | ORAL | 11 refills | Status: AC

## 2023-11-22 NOTE — Patient Instructions (Signed)

## 2023-11-22 NOTE — Progress Notes (Signed)
 ENT CONSULT:  Reason for Consult: globus sensation throat clearing   HPI: Discussed the use of AI scribe software for clinical note transcription with the patient, who gave verbal consent to proceed.  History of Present Illness [name] is a 73 year old male who presents with a sensation of a lump in the throat. Hx of CRSwNP and allergies, had b/l FESS with Dr Suszanne Conners 2020.   He has been experiencing a sensation of a lump in his throat for the past three months. This sensation is persistent and feels like something is stuck that he cannot cough up or clear. It has affected his voice (sounds raspy), and he notes a change in his voice since the onset of this symptom. No difficulty swallowing or shortness of breath is reported.  He has a history of chronic sinus issues and underwent sinus surgery to address recurrent sinus infections and chronic nasal congestion. Despite the surgery, he continues to experience sinus infections at times, particularly in the winter, which sometimes result in purulent nasal drainage. He uses a nasal steroid rinses when his sinuses become severely congested, but this is not a daily routine. He uses Flonase once daily in the morning. He experiences postnasal drainage but denies any current sinus infection.  He takes pantoprazole every other day for acid reflux, which he used to take daily but reduced due to decreased frequency of heartburn symptoms. Certain foods like spaghetti, pizza, or citrus can trigger reflux symptoms. He denies frequent heartburn but reports frequent burping.  He reports frequent burping and has been working with a gastroenterologist to manage this symptom. A recent trial involving a liquid medication taken four times a day reduced but did not eliminate the burping. He has been advised to avoid drinking through straws and carbonated beverages to help manage this issue.  He has not been formally tested for allergies but suspects he has them, as he  notices a difference in symptoms with changes in weather and pollen levels.  Records Reviewed:  Dr Avel Sensor Op Note 08/15/2019 PREOPERATIVE DIAGNOSES:  1. Bilateral chronic pansinusitis and polyposis. 2. Bilateral inferior turbinate hypertrophy.  3. Chronic nasal obstruction.   POSTOPERATIVE DIAGNOSES:  1. Bilateral chronic pansinusitis and polyposis. 2. Bilateral inferior turbinate hypertrophy.  3. Chronic nasal obstruction.   PROCEDURE PERFORMED:  1. Bilateral endoscopic frontal sinusotomy and polyp removal. 2. Bilateral endoscopic total ethmoidectomy and sphenoidotomy. 3. Bilateral endoscopic maxillary antrostomy with polyp removal. 4. Bilateral partial inferior turbinate resection.  5. FUSION stereotactic image guidance.   Past Medical History:  Diagnosis Date   Anemia    COUPLE OF YRS AGO- NO PROBLEMS SINCE   Complication of anesthesia    REMEMBERS FEELING REALLY COLD WAKING UP   GERD (gastroesophageal reflux disease)    RARE    Hyperlipidemia    Hypertension    Migraine    Pain    RIGHT SHOULDER - ROTATOR CUFF TEAR; denies further pain 08/23/2018.    Past Surgical History:  Procedure Laterality Date   BACK SURGERY  2006   LOWER BACK FUSION - SCREWS AND RODS   BACK SURGERY  06/2017   BIOPSY  12/24/2021   Procedure: BIOPSY;  Surgeon: Corbin Ade, MD;  Location: [facility];  Service: Endoscopy;;   BUNIONECTOMY WITH WEIL OSTEOTOMY Right 04/11/2020   Procedure: Right scarf and modified McBride bunionectomy, second and third metatarsal phalangeal weil osteotomy, third hammertoe correction with extensor tendon lengthening;  Surgeon: Toni Arthurs, MD;  Location: [facility];  Service: Orthopedics;  Laterality: Right;  90 mins [facility], choice   CATARACT EXTRACTION W/PHACO Right 11/26/2015   Procedure: CATARACT EXTRACTION PHACO AND INTRAOCULAR LENS PLACEMENT (IOC);  Surgeon: Jethro Bolus, MD;  Location: [facility];  Service: Ophthalmology;  Laterality: Right;   CDE:6.97   CATARACT EXTRACTION W/PHACO Left 12/10/2015   Procedure: CATARACT EXTRACTION PHACO AND INTRAOCULAR LENS PLACEMENT (IOC);  Surgeon: Jethro Bolus, MD;  Location: [facility];  Service: Ophthalmology;  Laterality: Left;  CDE:4.25   CHOLECYSTECTOMY     COLONOSCOPY WITH PROPOFOL N/A 12/02/2017   Procedure: COLONOSCOPY WITH PROPOFOL;  Surgeon: Corbin Ade, MD;  [facility]: [facility];  Service: Endoscopy;  Laterality: N/A;  8:30am   COLONOSCOPY WITH PROPOFOL N/A 12/30/2022   Procedure: COLONOSCOPY WITH PROPOFOL;  Surgeon: Corbin Ade, MD;  Location: [facility];  Service: Endoscopy;  Laterality: N/A;  915am, asa 2   ESOPHAGEAL MANOMETRY N/A 11/04/2022   Procedure: ESOPHAGEAL MANOMETRY (EM);  Surgeon: Napoleon Form, MD;  Location: [facility];  Service: Gastroenterology;  Laterality: N/A;   ESOPHAGOGASTRODUODENOSCOPY (EGD) WITH PROPOFOL N/A 12/24/2021   Procedure: ESOPHAGOGASTRODUODENOSCOPY (EGD) WITH PROPOFOL;  Surgeon: Corbin Ade, MD;  Location: [facility];  Service: Endoscopy;  Laterality: N/A;  3:00pm   ETHMOIDECTOMY Bilateral 08/15/2019   Procedure: ETHMOIDECTOMY;  Surgeon: Newman Pies, MD;  Location: [facility];  Service: ENT;  Laterality: Bilateral;   FINGERNAIL REMOVED RT INDEX FINGER - FOR CYST THAT WAS BEHIND THE NAIL     FRONTAL SINUS EXPLORATION Bilateral 08/15/2019   Procedure: FRONTAL SINUS EXPLORATION;  Surgeon: Newman Pies, MD;  Location: [facility];  Service: ENT;  Laterality: Bilateral;   left thumb surgery Left 06/2018   Dr. Hartford Poli SPINE SURGERY  08/11/2017   disc repair, Dr. Ned Card   MAXILLARY ANTROSTOMY Bilateral 08/15/2019   Procedure: MAXILLARY ANTROSTOMY WITH TISSUE REMOVAL;  Surgeon: Newman Pies, MD;  Location: [facility];  Service: ENT;  Laterality: Bilateral;   PH IMPEDANCE STUDY N/A 11/04/2022   Procedure: PH IMPEDANCE STUDY;  Surgeon: Napoleon Form, MD;  Location: [facility];  Service:  Gastroenterology;  Laterality: N/A;   SHOULDER OPEN ROTATOR CUFF REPAIR Right 09/01/2013   Procedure: RIGHT SHOULDER MINI OPEN ROTATOR CUFF REPAIR WITH SUBACROMIAL DECOMPRESSION;  Surgeon: Javier Docker, MD;  Location: [facility];  Service: Orthopedics;  Laterality: Right;   SINUS ENDO WITH FUSION Bilateral 08/15/2019   Procedure: SINUS ENDO WITH FUSION;  Surgeon: Newman Pies, MD;  Location: [facility];  Service: ENT;  Laterality: Bilateral;   SPHENOIDECTOMY Bilateral 08/15/2019   Procedure: SPHENOIDECTOMY;  Surgeon: Newman Pies, MD;  Location: [facility];  Service: ENT;  Laterality: Bilateral;   TONSILLECTOMY     SURGERY AS A CHILD   TURBINATE REDUCTION Bilateral 08/15/2019   Procedure: TURBINATE RECESSION;  Surgeon: Newman Pies, MD;  Location: [facility];  Service: ENT;  Laterality: Bilateral;    Family History  Problem Relation Age of Onset   Hypertension Mother    Kidney failure Mother    Diabetes Mother    High blood pressure Father    High blood pressure Sister    Cancer Brother        brain tumor   High blood pressure Brother    Other Other    High blood pressure Brother    Migraines Daughter    Migraines Grandson    Migraines Granddaughter    Migraines Granddaughter  Colon cancer Neg Hx     Social History:  reports that he has never smoked. He quit smokeless tobacco use about 8 years ago.  His smokeless tobacco use included snuff. He reports current alcohol use. He reports that he does not use drugs.  Allergies:  Allergies  Allergen Reactions   Bee Venom Itching   Codeine Itching    Tolerates hydrocodone    Medications: I have reviewed the patient's current medications.  The PMH, PSH, Medications, Allergies, and SH were reviewed and updated.  ROS: Constitutional: Negative for fever, weight loss and weight gain. Cardiovascular: Negative for chest pain and dyspnea on exertion. Respiratory: Is not experiencing shortness of  breath at rest. Gastrointestinal: Negative for nausea and vomiting. Neurological: Negative for headaches. Psychiatric: The patient is not nervous/anxious  Blood pressure (!) 157/84, pulse 74, height 5\' 9"  (1.753 m), weight 212 lb (96.2 kg), SpO2 98%. Body mass index is 31.31 kg/m.  PHYSICAL EXAM:  Exam: General: Well-developed, well-nourished Communication and Voice: raspy Respiratory Respiratory effort: Equal inspiration and expiration without stridor Cardiovascular Peripheral Vascular: Warm extremities with equal color/perfusion Eyes: No nystagmus with equal extraocular motion bilaterally Neuro/Psych/Balance: Patient oriented to person, place, and time; Appropriate mood and affect; Gait is intact with no imbalance; Cranial nerves I-XII are intact Head and Face Inspection: Normocephalic and atraumatic without mass or lesion Palpation: Facial skeleton intact without bony stepoffs Salivary Glands: No mass or tenderness Facial Strength: Facial motility symmetric and full bilaterally ENT Pinna: External ear intact and fully developed External canal: Canal is patent with intact skin Tympanic Membrane: Clear and mobile External Nose: No scar or anatomic deformity Internal Nose: Septum is relatively straight. No polyp, or purulence. Mucosal edema and erythema present. Post-op changes s/p b/l FESS. Scant clear drainage b/l. Lips, Teeth, and gums: Mucosa and teeth intact and viable TMJ: No pain to palpation with full mobility Oral cavity/oropharynx: No erythema or exudate, no lesions present Nasopharynx: No mass or lesion with intact mucosa Hypopharynx: Intact mucosa without pooling of secretions Larynx Glottic: Full true vocal cord mobility without lesion or mass mild VF atrophy (+) pseudo-sulcus Supraglottic: Normal appearing epiglottis and AE folds Interarytenoid Space: Moderate pachydermia&edema Subglottic Space: Patent without lesion or edema Neck Neck and Trachea: Midline  trachea without mass or lesion Thyroid: No mass or nodularity Lymphatics: No lymphadenopathy  Procedure: Preoperative diagnosis: globus, hoarseness  Postoperative diagnosis:   Same + GERD LPR  Procedure: Flexible fiberoptic laryngoscopy  Surgeon: Ashok Croon, MD  Anesthesia: Topical lidocaine and Afrin Complications: None Condition is stable throughout exam  Indications and consent:  The patient presents to the clinic with above symptoms. Indirect laryngoscopy view was incomplete. Thus it was recommended that they undergo a flexible fiberoptic laryngoscopy. All of the risks, benefits, and potential complications were reviewed with the patient preoperatively and verbal informed consent was obtained.  Procedure: The patient was seated upright in the clinic. Topical lidocaine and Afrin were applied to the nasal cavity. After adequate anesthesia had occurred, I then proceeded to pass the flexible telescope into the nasal cavity. The nasal cavity was patent without rhinorrhea or polyp. The nasopharynx was also patent without mass or lesion. The base of tongue was visualized and was normal. There were no signs of pooling of secretions in the piriform sinuses. The true vocal folds were mobile bilaterally. There were no signs of glottic or supraglottic mucosal lesion or mass. There was moderate interarytenoid pachydermia and post cricoid edema. The telescope was then slowly withdrawn and the  patient tolerated the procedure throughout.    PROCEDURE NOTE: nasal endoscopy  Preoperative diagnosis: chronic sinusitis symptoms  Postoperative diagnosis: same  Procedure: Diagnostic nasal endoscopy (16109)  Surgeon: Ashok Croon, M.D.  Anesthesia: Topical lidocaine and Afrin  H&P REVIEW: The patient's history and physical were reviewed today prior to procedure. All medications were reviewed and updated as well. Complications: None Condition is stable throughout exam Indications and  consent: The patient presents with symptoms of chronic sinusitis not responding to previous therapies. All the risks, benefits, and potential complications were reviewed with the patient preoperatively and informed consent was obtained. The time out was completed with confirmation of the correct procedure.   Procedure: The patient was seated upright in the clinic. Topical lidocaine and Afrin were applied to the nasal cavity. After adequate anesthesia had occurred, the rigid nasal endoscope was passed into the nasal cavity. The nasal mucosa, turbinates, septum, and sinus drainage pathways were visualized bilaterally. This revealed no purulence or significant secretions that might be cultured. There were no polyps or sites of significant inflammation. He had b/l post-op changes s/p FESS in 2020. The mucosa was intact and there was no crusting present. The scope was then slowly withdrawn and the patient tolerated the procedure well. There were no complications or blood loss.   Studies Reviewed: CT head 08/11/23 Sinuses/Orbits: There is mild diffuse mucoperiosteal thickening of paranasal sinuses. Postsurgical changes of the maxillary sinus. The mastoid air cells are clear.   Assessment/Plan: Encounter Diagnoses  Name Primary?   Globus sensation Yes   Chronic GERD    Environmental and seasonal allergies    Hypertrophy of both inferior nasal turbinates    Post-nasal drip    Dysphonia    Hoarseness    Chronic nasal congestion     Assessment and Plan Assessment & Plan Globus sensation Persistent sensation likely due to throat irritation from reflux and postnasal drainage. Examination shows no mass or tumor, but reflux and postnasal changes present. - Recommend nasal saline + steroid rinses 2-3 times weekly and saline rinses daily. - Prescribe Zyrtec for antihistamine effect. - Flonase on the days when he is not using steroid nasal rinses - Continue pantoprazole for reflux management. -  Recommend Reflux Gourmet supplement to form a barrier on stomach contents. - Advise dietary and lifestyle modifications to manage reflux, including avoiding late meals and certain foods.  Gastroesophageal reflux disease (GERD) LPR Reflux changes likely contribute to globus sensation. Pantoprazole taken every other day with occasional symptoms from specific foods. - Continue pantoprazole for acid suppression consider daily dose - Recommend Reflux Gourmet supplement to prevent reflux of stomach contents. - Advise dietary and lifestyle modifications to manage reflux symptoms.  CRSwNP s/p FESS with Dr Suszanne Conners 2020 No evidence of recurrence on nasal endoscopy today  - Advise regular nasal rinsing to reduce allergen load and postnasal drainage. - Use Flonase on days without steroid rinses.      Thank you for allowing me to participate in the care of this patient. Please do not hesitate to contact me with any questions or concerns.   Ashok Croon, MD Otolaryngology [facility] Phone: [phone] Fax: [phone]    11/22/2023, 1:40 PM

## 2023-12-01 DIAGNOSIS — Z713 Dietary counseling and surveillance: Secondary | ICD-10-CM | POA: Diagnosis not present

## 2023-12-01 DIAGNOSIS — Z7182 Exercise counseling: Secondary | ICD-10-CM | POA: Diagnosis not present

## 2023-12-01 DIAGNOSIS — R0989 Other specified symptoms and signs involving the circulatory and respiratory systems: Secondary | ICD-10-CM | POA: Diagnosis not present

## 2023-12-01 DIAGNOSIS — G43009 Migraine without aura, not intractable, without status migrainosus: Secondary | ICD-10-CM | POA: Diagnosis not present

## 2023-12-01 DIAGNOSIS — J302 Other seasonal allergic rhinitis: Secondary | ICD-10-CM | POA: Diagnosis not present

## 2023-12-01 DIAGNOSIS — I10 Essential (primary) hypertension: Secondary | ICD-10-CM | POA: Diagnosis not present

## 2023-12-01 DIAGNOSIS — M25542 Pain in joints of left hand: Secondary | ICD-10-CM | POA: Diagnosis not present

## 2023-12-01 DIAGNOSIS — K219 Gastro-esophageal reflux disease without esophagitis: Secondary | ICD-10-CM | POA: Diagnosis not present

## 2023-12-01 DIAGNOSIS — Z79899 Other long term (current) drug therapy: Secondary | ICD-10-CM | POA: Diagnosis not present

## 2023-12-01 DIAGNOSIS — M25541 Pain in joints of right hand: Secondary | ICD-10-CM | POA: Diagnosis not present

## 2023-12-01 DIAGNOSIS — Z6830 Body mass index (BMI) 30.0-30.9, adult: Secondary | ICD-10-CM | POA: Diagnosis not present

## 2023-12-21 ENCOUNTER — Ambulatory Visit: Admitting: Internal Medicine

## 2023-12-22 ENCOUNTER — Encounter: Payer: Self-pay | Admitting: Internal Medicine

## 2024-01-03 ENCOUNTER — Ambulatory Visit: Admitting: Internal Medicine

## 2024-01-03 ENCOUNTER — Encounter: Payer: Self-pay | Admitting: Internal Medicine

## 2024-01-03 VITALS — BP 109/75 | HR 103 | Temp 98.8°F | Ht 69.0 in | Wt 206.6 lb

## 2024-01-03 DIAGNOSIS — R142 Eructation: Secondary | ICD-10-CM

## 2024-01-03 NOTE — Patient Instructions (Signed)
 It was good to see you again today.  As we reviewed, you have been extensively evaluated for belching.  I do see you saw the ENT and are making progress with those symptoms.  It is possible that some of your belching is related to acid reflux and you could be in the minority of patients that do not respond to the standard acid blocker treatments like esomeprazole .  Lets try a change in class of acid blocker medication.  For the next 3 weeks try Voquenza - take (1) 20 mg capsule daily.  Use them in stead of esomeprazole .  Call me in 3 weeks and let me know if this is helped your symptoms.  Samples provided  Further recommendations to follow.

## 2024-01-03 NOTE — Progress Notes (Signed)
 Primary Care Physician:  Omie Bickers, MD Primary Gastroenterologist:  Dr. Riley Cheadle  Pre-Procedure History & Physical: HPI:  [name] is a 73 y.o. male here for follow-up of belching.  He tried baclofen  and much improvement.  He tried a FODMAP diet and Sucraid for low maltase isomaltase activity.  He thought this therapy helped a bit but did not or bother with the 4 doses daily.  He told me that out-of-pocket would be $15,000 a month.  Wife says he belches less these days we know he has weak esophageal peristalsis he does have reflux with good's symptom correlation with reflux episodes.  Not so much with belching.  EGD and colonoscopy findings as chronicled medical record.  Of note, patient says his globus symptoms have essentially resolved with the frequent saline flushes recommended by ENT.    Past Medical History:  Diagnosis Date   Anemia    COUPLE OF YRS AGO- NO PROBLEMS SINCE   Complication of anesthesia    REMEMBERS FEELING REALLY COLD WAKING UP   GERD (gastroesophageal reflux disease)    RARE    Hyperlipidemia    Hypertension    Migraine    Pain    RIGHT SHOULDER - ROTATOR CUFF TEAR; denies further pain 08/23/2018.    Past Surgical History:  Procedure Laterality Date   BACK SURGERY  2006   LOWER BACK FUSION - SCREWS AND RODS   BACK SURGERY  06/2017   BIOPSY  12/24/2021   Procedure: BIOPSY;  Surgeon: Suzette Espy, MD;  Location: [facility];  Service: Endoscopy;;   BUNIONECTOMY WITH WEIL OSTEOTOMY Right 04/11/2020   Procedure: Right scarf and modified McBride bunionectomy, second and third metatarsal phalangeal weil osteotomy, third hammertoe correction with extensor tendon lengthening;  Surgeon: Amada Backer, MD;  Location: [facility];  Service: Orthopedics;  Laterality: Right;  90 mins [facility], choice   CATARACT EXTRACTION W/PHACO Right 11/26/2015   Procedure: CATARACT EXTRACTION PHACO AND INTRAOCULAR LENS PLACEMENT (IOC);  Surgeon: Albert Huff, MD;  Location: [facility];  Service: Ophthalmology;  Laterality: Right;  CDE:6.97   CATARACT EXTRACTION W/PHACO Left 12/10/2015   Procedure: CATARACT EXTRACTION PHACO AND INTRAOCULAR LENS PLACEMENT (IOC);  Surgeon: Albert Huff, MD;  Location: [facility];  Service: Ophthalmology;  Laterality: Left;  CDE:4.25   CHOLECYSTECTOMY     COLONOSCOPY WITH PROPOFOL  N/A 12/02/2017   Procedure: COLONOSCOPY WITH PROPOFOL ;  Surgeon: Suzette Espy, MD;  [facility]: [facility];  Service: Endoscopy;  Laterality: N/A;  8:30am   COLONOSCOPY WITH PROPOFOL  N/A 12/30/2022   Procedure: COLONOSCOPY WITH PROPOFOL ;  Surgeon: Suzette Espy, MD;  Location: [facility];  Service: Endoscopy;  Laterality: N/A;  915am, asa 2   ESOPHAGEAL MANOMETRY N/A 11/04/2022   Procedure: ESOPHAGEAL MANOMETRY (EM);  Surgeon: Sergio Dandy, MD;  Location: [facility];  Service: Gastroenterology;  Laterality: N/A;   ESOPHAGOGASTRODUODENOSCOPY (EGD) WITH PROPOFOL  N/A 12/24/2021   Procedure: ESOPHAGOGASTRODUODENOSCOPY (EGD) WITH PROPOFOL ;  Surgeon: Suzette Espy, MD;  Location: [facility];  Service: Endoscopy;  Laterality: N/A;  3:00pm   ETHMOIDECTOMY Bilateral 08/15/2019   Procedure: ETHMOIDECTOMY;  Surgeon: Reynold Caves, MD;  Location: [facility];  Service: ENT;  Laterality: Bilateral;   FINGERNAIL REMOVED RT INDEX FINGER - FOR CYST THAT WAS BEHIND THE NAIL     FRONTAL SINUS EXPLORATION Bilateral 08/15/2019   Procedure: FRONTAL SINUS EXPLORATION;  Surgeon: Reynold Caves, MD;  Location: [facility];  Service: ENT;  Laterality: Bilateral;   left thumb surgery Left 06/2018   Dr. Annamae Barrett   LUMBAR SPINE SURGERY  08/11/2017   disc repair, Dr. Garrick Kanaris   MAXILLARY ANTROSTOMY Bilateral 08/15/2019   Procedure: MAXILLARY ANTROSTOMY WITH TISSUE REMOVAL;  Surgeon: Reynold Caves, MD;  Location: [facility];  Service: ENT;  Laterality: Bilateral;   PH IMPEDANCE STUDY N/A 11/04/2022   Procedure: PH IMPEDANCE  STUDY;  Surgeon: Sergio Dandy, MD;  Location: [facility];  Service: Gastroenterology;  Laterality: N/A;   SHOULDER OPEN ROTATOR CUFF REPAIR Right 09/01/2013   Procedure: RIGHT SHOULDER MINI OPEN ROTATOR CUFF REPAIR WITH SUBACROMIAL DECOMPRESSION;  Surgeon: Loel Ring, MD;  Location: [facility];  Service: Orthopedics;  Laterality: Right;   SINUS ENDO WITH FUSION Bilateral 08/15/2019   Procedure: SINUS ENDO WITH FUSION;  Surgeon: Reynold Caves, MD;  Location: [facility];  Service: ENT;  Laterality: Bilateral;   SPHENOIDECTOMY Bilateral 08/15/2019   Procedure: SPHENOIDECTOMY;  Surgeon: Reynold Caves, MD;  Location: [facility];  Service: ENT;  Laterality: Bilateral;   TONSILLECTOMY     SURGERY AS A CHILD   TURBINATE REDUCTION Bilateral 08/15/2019   Procedure: TURBINATE RECESSION;  Surgeon: Reynold Caves, MD;  Location:  [facility];  Service: ENT;  Laterality: Bilateral;    Prior to Admission medications   Medication Sig Start Date End Date Taking? Authorizing Provider  acetaminophen  (TYLENOL ) 650 MG CR tablet Take 1,300 mg by mouth every 8 (eight) hours as needed for pain.   Yes [provider]  aspirin  EC 81 MG tablet Take 81 mg by mouth daily. Swallow whole.   Yes [provider]  atorvastatin (LIPITOR) 20 MG tablet Take 20 mg by mouth daily with breakfast.    Yes [provider]  cetirizine  (ZYRTEC ) 10 MG tablet Take 1 tablet (10 mg total) by mouth daily. 11/22/23  Yes Soldatova, Liuba, MD  esomeprazole  (NEXIUM ) 40 MG capsule Take 40 mg by mouth daily. 11/16/23  Yes [provider]  fluticasone  (FLONASE ) 50 MCG/ACT nasal spray Place 2 sprays into both nostrils daily. 11/22/23  Yes Soldatova, Liuba, MD  LORazepam (ATIVAN) 0.5 MG tablet Take 0.5 mg by mouth at bedtime as needed for sleep.   Yes [provider]  Menthol , Topical Analgesic, (ICY HOT EX) Apply 1 Application topically daily as needed (pain).   Yes [provider]  METAMUCIL FIBER PO Take 1 Scoop by mouth daily.   Yes [provider]  Multiple Vitamins-Minerals (CENTRUM SILVER PO) Take 1 tablet by mouth daily.   Yes [provider]  Polyethyl Glycol-Propyl Glycol (SYSTANE OP) Place 1 drop into both eyes daily.   Yes [provider]  sennosides-docusate sodium  (SENOKOT-S) 8.6-50 MG tablet Take 1-2 tablets by mouth daily as needed for constipation.   Yes [provider]  SUMAtriptan (IMITREX) 100 MG tablet Take 100 mg by mouth every 2 (two) hours as needed for migraine.   Yes [provider]  telmisartan (MICARDIS) 80 MG tablet Take 80 mg by mouth daily. 12/09/23  Yes [provider]    Allergies as of 01/03/2024 - Review Complete 01/03/2024  Allergen Reaction Noted   Bee venom Itching 03/06/2016   Codeine Itching 04/13/2013    Family History  Problem Relation Age of Onset   Hypertension Mother    Kidney failure Mother    Diabetes Mother    High blood pressure Father    High blood pressure Sister  Cancer Brother        brain tumor   High blood pressure Brother    Other Other    High blood pressure Brother    Migraines Daughter    Migraines Grandson    Migraines Granddaughter    Migraines Granddaughter    Colon cancer Neg Hx     Social History   Socioeconomic History   Marital status: Married    Spouse name: Not on file   Number of children: 3   Years of education: 13   Highest education level: High school graduate  Occupational History   Not on file  Tobacco Use   Smoking status: Never   Smokeless tobacco: Former    Types: Snuff    Quit date: 10/2015  Vaping Use   Vaping status: Never Used  Substance and Sexual Activity   Alcohol use: Yes    Comment: 1 beer a week or less   Drug use: No   Sexual activity: Yes    Birth control/protection: None  Other Topics Concern   Not on file  Social History Narrative   Lives at home with his wife Trev Boley    Right handed   Caffeine: 2 cups daily, coffee. Maybe 1 soda a day.   Social Drivers of Corporate investment banker Strain: Not on file  Food Insecurity: Not on file  Transportation Needs: Not on file  Physical Activity: Not on file  Stress: Not on file  Social Connections: Not on file  Intimate Partner Violence: Not on file    Review of Systems: See HPI, otherwise negative ROS  Physical Exam: BP 109/75 (BP Location: Right Arm, Patient Position: Sitting, Cuff Size: Large)   Pulse (!) 103   Temp 98.8 F (37.1 C) (Oral)   Ht 5\' 9"  (1.753 m)   Wt 206 lb 9.6 oz (93.7 kg)   SpO2 95%   BMI 30.51 kg/m  General:   Alert,  Well-developed, well-nourished, pleasant and cooperative in NAD Abdomen: Non-distended, normal bowel sounds.  Soft and nontender without appreciable mass or hepatosplenomegaly.   Impression/Plan: Chronic belching this pleasant 73 year old gentleman he does have abnormal acid exposure with fairly good correlation of symptoms on ambulatory pH monitoring without acid suppression.  He has been on multiple different PPIs without perceptible improvement.  No improvement with FODMAP diet, baclofen  or supplementation; minimal improvement supplementation for maltase isomaltase deficiency.  Has a weakened esophageal body peristalsis on prior manometry.  He has been fairly extensively/exhaustively evaluated. He recently had a thorough ENT evaluation.  He is making headway with those symptoms based on recommendations from the otolaryngologist. I do not think this is rumination.  Belching may be a marker for poorly controlled reflux.  He may be in the 7% of people that have a poor response to PPI therapy.  He has declined referral to the tertiary referral center for second opinion on multiple occasions.  Recommendations  For the next 3 weeks try Voquenza - take (1) 20 mg capsule daily.  Use them in stead of esomeprazole .  Call me in 3 weeks and let me know if this is helped your  symptoms.  Samples provided  Further recommendations to follow.     Notice: This dictation was prepared with Dragon dictation along with smaller phrase technology. Any transcriptional errors that result from this process are unintentional and may not be corrected upon review.

## 2024-01-25 DIAGNOSIS — Z1283 Encounter for screening for malignant neoplasm of skin: Secondary | ICD-10-CM | POA: Diagnosis not present

## 2024-01-25 DIAGNOSIS — X32XXXD Exposure to sunlight, subsequent encounter: Secondary | ICD-10-CM | POA: Diagnosis not present

## 2024-01-25 DIAGNOSIS — L820 Inflamed seborrheic keratosis: Secondary | ICD-10-CM | POA: Diagnosis not present

## 2024-01-25 DIAGNOSIS — D225 Melanocytic nevi of trunk: Secondary | ICD-10-CM | POA: Diagnosis not present

## 2024-01-25 DIAGNOSIS — L570 Actinic keratosis: Secondary | ICD-10-CM | POA: Diagnosis not present

## 2024-01-27 ENCOUNTER — Telehealth: Payer: Self-pay

## 2024-01-27 NOTE — Telephone Encounter (Signed)
 Pt is aware.

## 2024-01-27 NOTE — Telephone Encounter (Signed)
 Pt called stating that the Voquezna was worse than the esomeprazole  and did not help at all. Pt went back on esomeprazole .

## 2024-02-23 DIAGNOSIS — X32XXXD Exposure to sunlight, subsequent encounter: Secondary | ICD-10-CM | POA: Diagnosis not present

## 2024-02-23 DIAGNOSIS — D2272 Melanocytic nevi of left lower limb, including hip: Secondary | ICD-10-CM | POA: Diagnosis not present

## 2024-02-23 DIAGNOSIS — D485 Neoplasm of uncertain behavior of skin: Secondary | ICD-10-CM | POA: Diagnosis not present

## 2024-02-23 DIAGNOSIS — L570 Actinic keratosis: Secondary | ICD-10-CM | POA: Diagnosis not present

## 2024-02-23 DIAGNOSIS — D2271 Melanocytic nevi of right lower limb, including hip: Secondary | ICD-10-CM | POA: Diagnosis not present

## 2024-02-24 ENCOUNTER — Ambulatory Visit (INDEPENDENT_AMBULATORY_CARE_PROVIDER_SITE_OTHER): Admitting: Otolaryngology

## 2024-02-24 ENCOUNTER — Encounter (INDEPENDENT_AMBULATORY_CARE_PROVIDER_SITE_OTHER): Payer: Self-pay | Admitting: Otolaryngology

## 2024-02-24 VITALS — BP 119/87 | HR 95

## 2024-02-24 DIAGNOSIS — R09A2 Foreign body sensation, throat: Secondary | ICD-10-CM | POA: Diagnosis not present

## 2024-02-24 DIAGNOSIS — R0982 Postnasal drip: Secondary | ICD-10-CM

## 2024-02-24 DIAGNOSIS — K219 Gastro-esophageal reflux disease without esophagitis: Secondary | ICD-10-CM | POA: Diagnosis not present

## 2024-02-24 DIAGNOSIS — R0981 Nasal congestion: Secondary | ICD-10-CM

## 2024-02-24 DIAGNOSIS — J3089 Other allergic rhinitis: Secondary | ICD-10-CM

## 2024-02-24 NOTE — Progress Notes (Signed)
 ENT Progress Note:   Update 02/24/2024  Discussed the use of AI scribe software for clinical note transcription with the patient, who gave verbal consent to proceed.  History of Present Illness [name] is a 73 year old male with hx of FESS for CRS with nasal polyps (Dr Karis) who presents with persistent throat complaints, globus and sensation of phlegm.   He experiences ongoing symptoms of throat, primarily feeling phlegm in his throat with some sinus involvement. Despite using saline rinses, his symptoms have not improved, and he has not received the steroid component. Doing nasal saline rinses.   He is currently taking Nexium  daily for reflux management. He uses Flonase  and a 10 mg Zyrtec  as part of his treatment regimen. He is unsure about the use of Reflux Gourmet, a supplement for reflux, and has not been using it due to cost concerns.   Records Reviewed:  Initial Evaluation  Reason for Consult: globus sensation throat clearing   HPI: Discussed the use of AI scribe software for clinical note transcription with the patient, who gave verbal consent to proceed.  History of Present Illness [name] is a 73 year old male who presents with a sensation of a lump in the throat. Hx of CRSwNP and allergies, had b/l FESS with Dr Karis 2020.   He has been experiencing a sensation of a lump in his throat for the past three months. This sensation is persistent and feels like something is stuck that he cannot cough up or clear. It has affected his voice (sounds raspy), and he notes a change in his voice since the onset of this symptom. No difficulty swallowing or shortness of breath is reported.  He has a history of chronic sinus issues and underwent sinus surgery to address recurrent sinus infections and chronic nasal congestion. Despite the surgery, he continues to experience sinus infections at times, particularly in the winter, which sometimes result in purulent nasal drainage. He  uses a nasal steroid rinses when his sinuses become severely congested, but this is not a daily routine. He uses Flonase  once daily in the morning. He experiences postnasal drainage but denies any current sinus infection.  He takes pantoprazole  every other day for acid reflux, which he used to take daily but reduced due to decreased frequency of heartburn symptoms. Certain foods like spaghetti, pizza, or citrus can trigger reflux symptoms. He denies frequent heartburn but reports frequent burping.  He reports frequent burping and has been working with a gastroenterologist to manage this symptom. A recent trial involving a liquid medication taken four times a day reduced but did not eliminate the burping. He has been advised to avoid drinking through straws and carbonated beverages to help manage this issue.  He has not been formally tested for allergies but suspects he has them, as he notices a difference in symptoms with changes in weather and pollen levels.   Records Reviewed:  Dr Rojean Op Note 08/15/2019 PREOPERATIVE DIAGNOSES:  1. Bilateral chronic pansinusitis and polyposis. 2. Bilateral inferior turbinate hypertrophy.  3. Chronic nasal obstruction.   POSTOPERATIVE DIAGNOSES:  1. Bilateral chronic pansinusitis and polyposis. 2. Bilateral inferior turbinate hypertrophy.  3. Chronic nasal obstruction.   PROCEDURE PERFORMED:  1. Bilateral endoscopic frontal sinusotomy and polyp removal. 2. Bilateral endoscopic total ethmoidectomy and sphenoidotomy. 3. Bilateral endoscopic maxillary antrostomy with polyp removal. 4. Bilateral partial inferior turbinate resection.  5. FUSION stereotactic image guidance.   Past Medical History:  Diagnosis Date   Anemia  COUPLE OF YRS AGO- NO PROBLEMS SINCE   Complication of anesthesia    REMEMBERS FEELING REALLY COLD WAKING UP   GERD (gastroesophageal reflux disease)    RARE    Hyperlipidemia    Hypertension    Migraine    Pain    RIGHT  SHOULDER - ROTATOR CUFF TEAR; denies further pain 08/23/2018.    Past Surgical History:  Procedure Laterality Date   BACK SURGERY  2006   LOWER BACK FUSION - SCREWS AND RODS   BACK SURGERY  06/2017   BIOPSY  12/24/2021   Procedure: BIOPSY;  Surgeon: Shaaron Lamar HERO, MD;  Location: [facility];  Service: Endoscopy;;   BUNIONECTOMY WITH WEIL OSTEOTOMY Right 04/11/2020   Procedure: Right scarf and modified McBride bunionectomy, second and third metatarsal phalangeal weil osteotomy, third hammertoe correction with extensor tendon lengthening;  Surgeon: Kit Rush, MD;  Location: [facility];  Service: Orthopedics;  Laterality: Right;  90 mins [facility], choice   CATARACT EXTRACTION W/PHACO Right 11/26/2015   Procedure: CATARACT EXTRACTION PHACO AND INTRAOCULAR LENS PLACEMENT (IOC);  Surgeon: Oneil Platts, MD;  Location: [facility];  Service: Ophthalmology;  Laterality: Right;  CDE:6.97   CATARACT EXTRACTION W/PHACO Left 12/10/2015   Procedure: CATARACT EXTRACTION PHACO AND INTRAOCULAR LENS PLACEMENT (IOC);  Surgeon: Oneil Platts, MD;  Location: [facility];  Service: Ophthalmology;  Laterality: Left;  CDE:4.25   CHOLECYSTECTOMY     COLONOSCOPY WITH PROPOFOL  N/A 12/02/2017   Procedure: COLONOSCOPY WITH PROPOFOL ;  Surgeon: Shaaron Lamar HERO, MD;  [facility]: [facility];  Service: Endoscopy;  Laterality: N/A;  8:30am   COLONOSCOPY WITH PROPOFOL  N/A 12/30/2022   Procedure: COLONOSCOPY WITH PROPOFOL ;  Surgeon: Shaaron Lamar HERO, MD;  [facility]: [facility];  Service: Endoscopy;  Laterality: N/A;  915am, asa 2   ESOPHAGEAL MANOMETRY N/A 11/04/2022   Procedure: ESOPHAGEAL MANOMETRY (EM);  Surgeon: Shila Gustav GAILS, MD;  Location: [facility];  Service: Gastroenterology;  Laterality: N/A;   ESOPHAGOGASTRODUODENOSCOPY (EGD) WITH PROPOFOL  N/A 12/24/2021   Procedure: ESOPHAGOGASTRODUODENOSCOPY (EGD) WITH PROPOFOL ;  Surgeon: Shaaron Lamar HERO, MD;  Location: [facility];  Service: Endoscopy;  Laterality:  N/A;  3:00pm   ETHMOIDECTOMY Bilateral 08/15/2019   Procedure: ETHMOIDECTOMY;  Surgeon: Karis Clunes, MD;  Location: [facility];  Service: ENT;  Laterality: Bilateral;   FINGERNAIL REMOVED RT INDEX FINGER - FOR CYST THAT WAS BEHIND THE NAIL     FRONTAL SINUS EXPLORATION Bilateral 08/15/2019   Procedure: FRONTAL SINUS EXPLORATION;  Surgeon: Karis Clunes, MD;  Location: [facility];  Service: ENT;  Laterality: Bilateral;   left thumb surgery Left 06/2018   Dr. Shari ILES SPINE SURGERY  08/11/2017   disc repair, Dr. Royden Loots   MAXILLARY ANTROSTOMY Bilateral 08/15/2019   Procedure: MAXILLARY ANTROSTOMY WITH TISSUE REMOVAL;  Surgeon: Karis Clunes, MD;  Location: [facility];  Service: ENT;  Laterality: Bilateral;   PH IMPEDANCE STUDY N/A 11/04/2022   Procedure: PH IMPEDANCE STUDY;  Surgeon: Shila Gustav GAILS, MD;  Location: [facility];  Service: Gastroenterology;  Laterality: N/A;   SHOULDER OPEN ROTATOR CUFF REPAIR Right 09/01/2013   Procedure: RIGHT SHOULDER MINI OPEN ROTATOR CUFF REPAIR WITH SUBACROMIAL DECOMPRESSION;  Surgeon: Reyes JAYSON Billing, MD;  Location: [facility];  Service: Orthopedics;  Laterality: Right;   SINUS ENDO WITH FUSION Bilateral 08/15/2019   Procedure: SINUS ENDO WITH FUSION;  Surgeon: Karis Clunes, MD;  Location: [facility];  Service: ENT;  Laterality: Bilateral;  SPHENOIDECTOMY Bilateral 08/15/2019   Procedure: SPHENOIDECTOMY;  Surgeon: Karis Clunes, MD;  Location: [facility];  Service: ENT;  Laterality: Bilateral;   TONSILLECTOMY     SURGERY AS A CHILD   TURBINATE REDUCTION Bilateral 08/15/2019   Procedure: TURBINATE RECESSION;  Surgeon: Karis Clunes, MD;  Location: [facility];  Service: ENT;  Laterality: Bilateral;    Family History  Problem Relation Age of Onset   Hypertension Mother    Kidney failure Mother    Diabetes Mother    High blood pressure Father    High blood pressure Sister     Cancer Brother        brain tumor   High blood pressure Brother    Other Other    High blood pressure Brother    Migraines Daughter    Migraines Grandson    Migraines Granddaughter    Migraines Granddaughter    Colon cancer Neg Hx     Social History:  reports that he has never smoked. He quit smokeless tobacco use about 8 years ago.  His smokeless tobacco use included snuff. He reports current alcohol use. He reports that he does not use drugs.  Allergies:  Allergies  Allergen Reactions   Bee Venom Itching   Codeine Itching    Tolerates hydrocodone    Medications: I have reviewed the patient's current medications.  The PMH, PSH, Medications, Allergies, and SH were reviewed and updated.  ROS: Constitutional: Negative for fever, weight loss and weight gain. Cardiovascular: Negative for chest pain and dyspnea on exertion. Respiratory: Is not experiencing shortness of breath at rest. Gastrointestinal: Negative for nausea and vomiting. Neurological: Negative for headaches. Psychiatric: The patient is not nervous/anxious  Blood pressure 119/87, pulse 95, SpO2 94%. There is no height or weight on file to calculate BMI.  PHYSICAL EXAM:  Exam: General: Well-developed, well-nourished Communication and Voice: raspy Respiratory Respiratory effort: Equal inspiration and expiration without stridor Cardiovascular Peripheral Vascular: Warm extremities with equal color/perfusion Eyes: No nystagmus with equal extraocular motion bilaterally Neuro/Psych/Balance: Patient oriented to person, place, and time; Appropriate mood and affect; Gait is intact with no imbalance; Cranial nerves I-XII are intact Head and Face Inspection: Normocephalic and atraumatic without mass or lesion Palpation: Facial skeleton intact without bony stepoffs Salivary Glands: No mass or tenderness Facial Strength: Facial motility symmetric and full bilaterally ENT Pinna: External ear intact and fully  developed External canal: Canal is patent with intact skin Tympanic Membrane: Clear and mobile External Nose: No scar or anatomic deformity Internal Nose: Septum is relatively straight. No polyp, or purulence. Mucosal edema and erythema present. Post-op changes s/p b/l FESS. Scant clear drainage b/l. Lips, Teeth, and gums: Mucosa and teeth intact and viable TMJ: No pain to palpation with full mobility Oral cavity/oropharynx: No erythema or exudate, no lesions present Nasopharynx: No mass or lesion with intact mucosa Hypopharynx: Intact mucosa without pooling of secretions Larynx Glottic: Full true vocal cord mobility without lesion or mass mild VF atrophy Supraglottic: Normal appearing epiglottis and AE folds Interarytenoid Space: Moderate pachydermia&edema Subglottic Space: Patent without lesion or edema Neck Neck and Trachea: Midline trachea without mass or lesion Thyroid: No mass or nodularity Lymphatics: No lymphadenopathy  Procedure: Preoperative diagnosis: globus, phlegm in the throat  Postoperative diagnosis:   Same + GERD LPR  Procedure: Flexible fiberoptic laryngoscopy  Surgeon: Elena Larry, MD  Anesthesia: Topical lidocaine  and Afrin Complications: None Condition is stable throughout exam  Indications and consent:  The patient presents to  the clinic with above symptoms. Indirect laryngoscopy view was incomplete. Thus it was recommended that they undergo a flexible fiberoptic laryngoscopy. All of the risks, benefits, and potential complications were reviewed with the patient preoperatively and verbal informed consent was obtained.  Procedure: The patient was seated upright in the clinic. Topical lidocaine  and Afrin were applied to the nasal cavity. After adequate anesthesia had occurred, I then proceeded to pass the flexible telescope into the nasal cavity. The nasal cavity was patent without rhinorrhea or polyp. The nasopharynx was also patent without mass or lesion.  The base of tongue was visualized and was normal. There were no signs of pooling of secretions in the piriform sinuses. The true vocal folds were mobile bilaterally. There were no signs of glottic or supraglottic mucosal lesion or mass. There was moderate interarytenoid pachydermia and post cricoid edema. The telescope was then slowly withdrawn and the patient tolerated the procedure throughout.   Studies Reviewed: CT head 08/11/23 Sinuses/Orbits: There is mild diffuse mucoperiosteal thickening of paranasal sinuses. Postsurgical changes of the maxillary sinus. The mastoid air cells are clear.   Assessment/Plan: Encounter Diagnoses  Name Primary?   Chronic GERD Yes   Globus sensation    Environmental and seasonal allergies    Post-nasal drip    Chronic nasal congestion      Assessment and Plan Assessment & Plan Globus sensation Persistent sensation likely due to throat irritation from reflux and postnasal drainage. Examination shows no mass or tumor, but reflux and postnasal changes present. - Recommend nasal saline + steroid rinses 2-3 times weekly and saline rinses daily. - Prescribe Zyrtec  for antihistamine effect. - Flonase  on the days when he is not using steroid nasal rinses - Continue pantoprazole  for reflux management. - Recommend Reflux Gourmet supplement to form a barrier on stomach contents. - Advise dietary and lifestyle modifications to manage reflux, including avoiding late meals and certain foods.  Gastroesophageal reflux disease (GERD) LPR Reflux changes likely contribute to globus sensation. Pantoprazole  taken every other day with occasional symptoms from specific foods. - Continue pantoprazole  for acid suppression consider daily dose - Recommend Reflux Gourmet supplement to prevent reflux of stomach contents. - Advise dietary and lifestyle modifications to manage reflux symptoms.  CRSwNP s/p FESS with Dr Karis 2020 No evidence of recurrence on nasal endoscopy  today  - Advise regular nasal rinsing to reduce allergen load and postnasal drainage. - Use Flonase  on days without steroid rinses.  Update 02/24/24 Chronic asal congestion and post-nasal drainage. Hx of CRSwNP s/p FESS w/Dr Karis - Continue Flonase /Zyrtec  10 mg daily. - Continue saline nasal rinses.  Globus hx of GERD LPR Flexible scope exam is unchanged without masses or lesions. Discussed dietary and lifestyle modifications. Reflux Gourmet supplement discussed but cost is a concern. - Continue Nexium  daily. - Order Reflux Gourmet supplement on [employer] if able. - Implement dietary changes such as eating early meals, avoiding late dinners, and not lying down immediately after eating. - Consider using an extra pillow while sleeping.   Thank you for allowing me to participate in the care of this patient. Please do not hesitate to contact me with any questions or concerns.   Elena Larry, MD Otolaryngology [facility] Phone: [phone] Fax: [phone]    02/24/2024, 10:30 AM

## 2024-02-24 NOTE — Patient Instructions (Signed)

## 2024-04-07 ENCOUNTER — Encounter: Payer: Self-pay | Admitting: Radiology

## 2024-04-14 DIAGNOSIS — R7303 Prediabetes: Secondary | ICD-10-CM | POA: Diagnosis not present

## 2024-04-14 DIAGNOSIS — E782 Mixed hyperlipidemia: Secondary | ICD-10-CM | POA: Diagnosis not present

## 2024-05-01 DIAGNOSIS — J329 Chronic sinusitis, unspecified: Secondary | ICD-10-CM | POA: Diagnosis not present

## 2024-05-01 DIAGNOSIS — M545 Low back pain, unspecified: Secondary | ICD-10-CM | POA: Diagnosis not present

## 2024-05-01 DIAGNOSIS — G43009 Migraine without aura, not intractable, without status migrainosus: Secondary | ICD-10-CM | POA: Diagnosis not present

## 2024-05-01 DIAGNOSIS — R0989 Other specified symptoms and signs involving the circulatory and respiratory systems: Secondary | ICD-10-CM | POA: Diagnosis not present

## 2024-05-01 DIAGNOSIS — E782 Mixed hyperlipidemia: Secondary | ICD-10-CM | POA: Diagnosis not present

## 2024-05-01 DIAGNOSIS — F411 Generalized anxiety disorder: Secondary | ICD-10-CM | POA: Diagnosis not present

## 2024-05-01 DIAGNOSIS — J302 Other seasonal allergic rhinitis: Secondary | ICD-10-CM | POA: Diagnosis not present

## 2024-05-01 DIAGNOSIS — R918 Other nonspecific abnormal finding of lung field: Secondary | ICD-10-CM | POA: Diagnosis not present

## 2024-05-01 DIAGNOSIS — K219 Gastro-esophageal reflux disease without esophagitis: Secondary | ICD-10-CM | POA: Diagnosis not present

## 2024-05-01 DIAGNOSIS — I129 Hypertensive chronic kidney disease with stage 1 through stage 4 chronic kidney disease, or unspecified chronic kidney disease: Secondary | ICD-10-CM | POA: Diagnosis not present

## 2024-05-01 DIAGNOSIS — M160 Bilateral primary osteoarthritis of hip: Secondary | ICD-10-CM | POA: Diagnosis not present

## 2024-05-01 DIAGNOSIS — I10 Essential (primary) hypertension: Secondary | ICD-10-CM | POA: Diagnosis not present

## 2024-05-12 DIAGNOSIS — M79674 Pain in right toe(s): Secondary | ICD-10-CM | POA: Diagnosis not present

## 2024-06-19 ENCOUNTER — Encounter: Payer: Self-pay | Admitting: Radiology

## 2024-07-31 ENCOUNTER — Other Ambulatory Visit (HOSPITAL_COMMUNITY): Payer: Self-pay | Admitting: Internal Medicine

## 2024-07-31 DIAGNOSIS — R918 Other nonspecific abnormal finding of lung field: Secondary | ICD-10-CM

## 2024-08-21 ENCOUNTER — Ambulatory Visit (HOSPITAL_COMMUNITY)
Admission: RE | Admit: 2024-08-21 | Discharge: 2024-08-21 | Disposition: A | Discharge status: Home / Self Care | Source: Ambulatory Visit | Attending: Internal Medicine | Admitting: Internal Medicine

## 2024-08-21 DIAGNOSIS — R918 Other nonspecific abnormal finding of lung field: Secondary | ICD-10-CM | POA: Diagnosis present
